# Patient Record
Sex: Female | Born: 1985 | Race: White | Hispanic: No | Smoking: Never smoker
Health system: Southern US, Community
[De-identification: ages and names within clinical notes are randomized; demographics above are authoritative.]

## PROBLEM LIST (undated history)

## (undated) ENCOUNTER — Inpatient Hospital Stay (HOSPITAL_COMMUNITY): Payer: Self-pay

## (undated) DIAGNOSIS — M519 Unspecified thoracic, thoracolumbar and lumbosacral intervertebral disc disorder: Secondary | ICD-10-CM

## (undated) DIAGNOSIS — I469 Cardiac arrest, cause unspecified: Secondary | ICD-10-CM

## (undated) DIAGNOSIS — D649 Anemia, unspecified: Secondary | ICD-10-CM

## (undated) DIAGNOSIS — M5134 Other intervertebral disc degeneration, thoracic region: Secondary | ICD-10-CM

## (undated) DIAGNOSIS — C539 Malignant neoplasm of cervix uteri, unspecified: Secondary | ICD-10-CM

## (undated) DIAGNOSIS — Z5189 Encounter for other specified aftercare: Secondary | ICD-10-CM

## (undated) DIAGNOSIS — I10 Essential (primary) hypertension: Secondary | ICD-10-CM

## (undated) DIAGNOSIS — O139 Gestational [pregnancy-induced] hypertension without significant proteinuria, unspecified trimester: Secondary | ICD-10-CM

## (undated) HISTORY — DX: Cardiac arrest, cause unspecified: I46.9

## (undated) HISTORY — DX: Encounter for other specified aftercare: Z51.89

## (undated) HISTORY — PX: CERVICAL CONE BIOPSY: SUR198

## (undated) HISTORY — PX: APPENDECTOMY: SHX54

## (undated) HISTORY — PX: LEEP: SHX91

## (undated) HISTORY — PX: OTHER SURGICAL HISTORY: SHX169

## (undated) HISTORY — PX: CHOLECYSTECTOMY: SHX55

## (undated) HISTORY — DX: Anemia, unspecified: D64.9

## (undated) HISTORY — DX: Other intervertebral disc degeneration, thoracic region: M51.34

---

## 2013-06-26 ENCOUNTER — Encounter (HOSPITAL_COMMUNITY): Payer: Self-pay

## 2013-06-26 ENCOUNTER — Inpatient Hospital Stay (HOSPITAL_COMMUNITY)
Admission: EM | Admit: 2013-06-26 | Discharge: 2013-06-28 | DRG: 880 | Disposition: A | Discharge status: Home / Self Care | Payer: MEDICAID | Source: Other Acute Inpatient Hospital | Attending: Internal Medicine | Admitting: Internal Medicine

## 2013-06-26 DIAGNOSIS — F411 Generalized anxiety disorder: Secondary | ICD-10-CM | POA: Diagnosis present

## 2013-06-26 DIAGNOSIS — I498 Other specified cardiac arrhythmias: Secondary | ICD-10-CM | POA: Diagnosis present

## 2013-06-26 DIAGNOSIS — Z8674 Personal history of sudden cardiac arrest: Secondary | ICD-10-CM

## 2013-06-26 DIAGNOSIS — R000 Tachycardia, unspecified: Secondary | ICD-10-CM

## 2013-06-26 DIAGNOSIS — E876 Hypokalemia: Secondary | ICD-10-CM | POA: Diagnosis present

## 2013-06-26 DIAGNOSIS — I471 Supraventricular tachycardia, unspecified: Secondary | ICD-10-CM | POA: Diagnosis present

## 2013-06-26 DIAGNOSIS — R5383 Other fatigue: Secondary | ICD-10-CM

## 2013-06-26 DIAGNOSIS — Z87410 Personal history of cervical dysplasia: Secondary | ICD-10-CM

## 2013-06-26 DIAGNOSIS — R569 Unspecified convulsions: Secondary | ICD-10-CM | POA: Diagnosis not present

## 2013-06-26 DIAGNOSIS — H5704 Mydriasis: Secondary | ICD-10-CM | POA: Diagnosis present

## 2013-06-26 DIAGNOSIS — R4182 Altered mental status, unspecified: Secondary | ICD-10-CM | POA: Diagnosis present

## 2013-06-26 DIAGNOSIS — R5381 Other malaise: Secondary | ICD-10-CM

## 2013-06-26 DIAGNOSIS — R011 Cardiac murmur, unspecified: Secondary | ICD-10-CM | POA: Diagnosis present

## 2013-06-26 DIAGNOSIS — R531 Weakness: Secondary | ICD-10-CM

## 2013-06-26 DIAGNOSIS — F449 Dissociative and conversion disorder, unspecified: Principal | ICD-10-CM

## 2013-06-26 LAB — RAPID URINE DRUG SCREEN, HOSP PERFORMED
Amphetamines: NOT DETECTED
Benzodiazepines: POSITIVE — AB
Cocaine: NOT DETECTED
Opiates: NOT DETECTED
Tetrahydrocannabinol: NOT DETECTED

## 2013-06-26 LAB — URINALYSIS, ROUTINE W REFLEX MICROSCOPIC
Bilirubin Urine: NEGATIVE
Glucose, UA: NEGATIVE mg/dL
Ketones, ur: NEGATIVE mg/dL
Leukocytes, UA: NEGATIVE
Protein, ur: NEGATIVE mg/dL
pH: 7 (ref 5.0–8.0)

## 2013-06-26 LAB — TSH: TSH: 0.779 u[IU]/mL (ref 0.350–4.500)

## 2013-06-26 LAB — CBC
Hemoglobin: 14.5 g/dL (ref 12.0–15.0)
MCH: 31 pg (ref 26.0–34.0)
MCHC: 35.5 g/dL (ref 30.0–36.0)
MCV: 87.4 fL (ref 78.0–100.0)
RBC: 4.68 MIL/uL (ref 3.87–5.11)

## 2013-06-26 MED ORDER — ACETAMINOPHEN 325 MG PO TABS
650.0000 mg | ORAL_TABLET | Freq: Four times a day (QID) | ORAL | Status: DC | PRN
  Administered 2013-06-26 – 2013-06-27 (×2): 650 mg via ORAL
  Filled 2013-06-26 (×2): qty 2

## 2013-06-26 MED ORDER — ACETAMINOPHEN 650 MG RE SUPP: 650.0000 mg | Freq: Four times a day (QID) | RECTAL | Status: DC | PRN

## 2013-06-26 MED ORDER — ONDANSETRON HCL 4 MG/2ML IJ SOLN
4.0000 mg | Freq: Four times a day (QID) | INTRAMUSCULAR | Status: DC | PRN
  Administered 2013-06-26 – 2013-06-27 (×2): 4 mg via INTRAVENOUS
  Filled 2013-06-26 (×2): qty 2

## 2013-06-26 MED ORDER — SODIUM CHLORIDE 0.9 % IJ SOLN
3.0000 mL | Freq: Two times a day (BID) | INTRAMUSCULAR | Status: DC
  Administered 2013-06-27: 3 mL via INTRAVENOUS

## 2013-06-26 MED ORDER — SODIUM CHLORIDE 0.9 % IV SOLN: INTRAVENOUS | Status: DC

## 2013-06-26 MED ORDER — HEPARIN SODIUM (PORCINE) 5000 UNIT/ML IJ SOLN
5000.0000 [IU] | Freq: Three times a day (TID) | INTRAMUSCULAR | Status: DC
  Administered 2013-06-26 – 2013-06-28 (×4): 5000 [IU] via SUBCUTANEOUS
  Filled 2013-06-26 (×8): qty 1
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
## 2013-06-26 NOTE — Progress Notes (Signed)
In room obtaining EKG and pt was verbal, engaging in normal conversation, pt became non-verbal and essentially unresponsive, with HR - Sinus Tach at 110, 99% on room air, respirations 17, BP: 158/80 - Lennette Bihari and Dr. Sung Amabile made aware.

## 2013-06-26 NOTE — Progress Notes (Signed)
While assessing patient, patient stated, "The next time they intubate me make sure they don't stick it down too far, I have been intubated three times and know when it is down to far." Pt sleepy but easily aroused by verbal stimulation. Vitals are stable.
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
## 2013-06-26 NOTE — Consult Note (Signed)
Reason for Consult: tachycardia  Requesting Physician: CCM  HPI: This is a 27 y.o. female with a past medical history significant for a history of reported LOC episodes since Feb 2013 requiring intubation on two prior occasions. Apparently no obvious cause has been found. She had a work up at [facility] in Nov 2012 (?) and some records from [place] indicate the final diagnosis was conversion disorder. There was a report she had an aborted attempt at a RFA because of HR 250. Yesterday she presented to the ER at [facility] with a complaint of weakness and fatigue. While in CT she became unresponsive and was intubated. She was later extubated and transferred to [facility] for further evaluation. Drug screen was negative. When I tried to interview her she was very lethargic but did wake up and respond. Her answers though were suspect. While I was speaking with her her HR went from 100 to 150- sinus tach.   PMHx:  No past medical history on file. No past surgical history on file.  FAMHx: No family history on file.  SOCHx:  has no tobacco, alcohol, and drug history on file.  ALLERGIES: Allergies  Allergen Reactions  . Codeine   . Compazine (Prochlorperazine Edisylate)   . Reglan (Metoclopramide)     ROS: Pertinent items are noted in HPI.  HOME MEDICATIONS: No prescriptions prior to admission    HOSPITAL MEDICATIONS: I have reviewed the patient's current medications.  VITALS: Blood pressure 122/68, pulse 75, temperature 98.7 F (37.1 C), temperature source Oral, resp. rate 19, height 5\' 6"  (1.676 m), weight 85.3 kg (188 lb 0.8 oz), SpO2 99.00%.  PHYSICAL EXAM: General appearance: appears stated age and slowed mentation Neck: no carotid bruit, no JVD and thyroid not enlarged, symmetric, no tenderness/mass/nodules Lungs: clear to auscultation bilaterally Heart: regular rate and rhythm, S1, S2 normal, no murmur, click, rub or gallop Abdomen: soft, non-tender; bowel sounds normal; no masses,  no  organomegaly Extremities: extremities normal, atraumatic, no cyanosis or edema Pulses: 2+ and symmetric Skin: Skin color, texture, turgor normal. No rashes or lesions Neurologic: Grossly normal she was lethargic but did wake up and answer questions.  LABS: Results for orders placed during the hospital encounter of 06/26/13 (from the past 48 hour(s))  URINALYSIS, ROUTINE W REFLEX MICROSCOPIC     Status: Abnormal   Collection Time    06/26/13  4:28 PM      Result Value Range   Color, Urine GREEN (*) YELLOW   Comment: BIOCHEMICALS MAY BE AFFECTED BY COLOR   APPearance CLEAR  CLEAR   Specific Gravity, Urine 1.010  1.005 - 1.030   pH 7.0  5.0 - 8.0   Glucose, UA NEGATIVE  NEGATIVE mg/dL   Hgb urine dipstick NEGATIVE  NEGATIVE   Bilirubin Urine NEGATIVE  NEGATIVE   Ketones, ur NEGATIVE  NEGATIVE mg/dL   Protein, ur NEGATIVE  NEGATIVE mg/dL   Urobilinogen, UA 0.2  0.0 - 1.0 mg/dL   Nitrite NEGATIVE  NEGATIVE   Leukocytes, UA NEGATIVE  NEGATIVE   Comment: MICROSCOPIC NOT DONE ON URINES WITH NEGATIVE PROTEIN, BLOOD, LEUKOCYTES, NITRITE, OR GLUCOSE <1000 mg/dL.    EKG: pend  IMAGING: No results found.  IMPRESSION: Active Problems:   Tachycardia- history of PSVT with ? attempted RFA at [facility] Nov 2013   Altered mental status- syncope while in CT- intubated then extubated    Weakness generalized- presented to the ER at [facility] with this complaint 06/26/13   Conversion reaction- reported final diagnosis from [facility] work  up   RECOMMENDATION: Drug screen pending. Check EKG. Will attempt to get records from [facility].   Time Spent Directly with Patient: 40 minutes  Abelino Derrick [phone] beeper 06/26/2013, 5:22 PM

## 2013-06-27 ENCOUNTER — Inpatient Hospital Stay (HOSPITAL_COMMUNITY): Payer: Self-pay

## 2013-06-27 ENCOUNTER — Inpatient Hospital Stay (HOSPITAL_COMMUNITY): Payer: MEDICAID

## 2013-06-27 LAB — BASIC METABOLIC PANEL
BUN: 13 mg/dL (ref 6–23)
Chloride: 101 mEq/L (ref 96–112)
Creatinine, Ser: 0.81 mg/dL (ref 0.50–1.10)
GFR calc Af Amer: >90 mL/min (ref >90)

## 2013-06-27 LAB — HEPATIC FUNCTION PANEL
Albumin: 4.1 g/dL (ref 3.5–5.2)
Bilirubin, Direct: 0.1 mg/dL (ref 0.0–0.3)
Total Bilirubin: 1 mg/dL (ref 0.3–1.2)

## 2013-06-27 LAB — MAGNESIUM: Magnesium: 2.3 mg/dL (ref 1.5–2.5)

## 2013-06-27 LAB — LIPASE, BLOOD: Lipase: 31 U/L (ref 11–59)

## 2013-06-27 LAB — CBC
HCT: 39.5 % (ref 36.0–46.0)
MCHC: 34.9 g/dL (ref 30.0–36.0)
MCV: 88 fL (ref 78.0–100.0)
RDW: 12.4 % (ref 11.5–15.5)

## 2013-06-27 MED ORDER — LORAZEPAM 2 MG/ML IJ SOLN
0.5000 mg | Freq: Once | INTRAMUSCULAR | Status: AC
  Administered 2013-06-27: 0.5 mg via INTRAVENOUS

## 2013-06-27 MED ORDER — PROMETHAZINE HCL 25 MG/ML IJ SOLN
12.5000 mg | Freq: Four times a day (QID) | INTRAMUSCULAR | Status: DC | PRN
  Administered 2013-06-27: 12.5 mg via INTRAVENOUS
  Filled 2013-06-27 (×2): qty 1

## 2013-06-27 MED ORDER — LORAZEPAM 2 MG/ML IJ SOLN
INTRAMUSCULAR | Status: AC
  Filled 2013-06-27: qty 1

## 2013-06-27 MED ORDER — POTASSIUM CHLORIDE CRYS ER 20 MEQ PO TBCR
40.0000 meq | EXTENDED_RELEASE_TABLET | Freq: Once | ORAL | Status: AC
  Administered 2013-06-27: 40 meq via ORAL
  Filled 2013-06-27: qty 2

## 2013-06-27 MED ORDER — TRAMADOL HCL 50 MG PO TABS
100.0000 mg | ORAL_TABLET | Freq: Four times a day (QID) | ORAL | Status: DC | PRN
  Administered 2013-06-27 – 2013-06-28 (×2): 100 mg via ORAL
  Filled 2013-06-27 (×3): qty 2

## 2013-06-27 MED ORDER — METOPROLOL TARTRATE 1 MG/ML IV SOLN
5.0000 mg | Freq: Once | INTRAVENOUS | Status: AC
  Administered 2013-06-27: 5 mg via INTRAVENOUS

## 2013-06-27 MED ORDER — ATENOLOL 25 MG PO TABS
25.0000 mg | ORAL_TABLET | Freq: Every day | ORAL | Status: DC
  Filled 2013-06-27 (×2): qty 1

## 2013-06-27 MED ORDER — PROMETHAZINE HCL 25 MG/ML IJ SOLN
12.5000 mg | Freq: Four times a day (QID) | INTRAMUSCULAR | Status: DC | PRN
  Administered 2013-06-27 – 2013-06-28 (×2): 12.5 mg via INTRAVENOUS
  Filled 2013-06-27 (×2): qty 1

## 2013-06-27 MED ORDER — METOPROLOL TARTRATE 1 MG/ML IV SOLN
INTRAVENOUS | Status: AC
  Filled 2013-06-27: qty 5

## 2013-06-27 NOTE — Progress Notes (Addendum)
eLink Physician-Brief Progress Note Patient Name: Regina Foley DOB: 03-14-1986 MRN: [id number]  Date of Service  06/27/2013   HPI/Events of Note   C/o leg cramps  eICU Interventions  Check bmet, phos, mag  Labs back 5:44 AM  Recent Labs Lab 06/26/13 1740 06/27/13 0425  NA  --  137  K  --  3.4*  CL  --  101  CO2  --  24  GLUCOSE  --  93  BUN  --  13  CREATININE 0.81 0.81  CALCIUM  --  9.6  MG  --  2.3  PHOS  --  3.4   PLAN  - replete KCL and watch for cramps      Regina Foley 06/27/2013, 3:57 AM

## 2013-06-27 NOTE — Consult Note (Signed)
Reason for Consult: Pseudo-seizures, conversion disorder Referring Physician: Dr. Armando Reichert Regina Foley is an 27 y.o. female.  HPI: Patient complains of nausea and is visibly upset about waiting for her phenergan.  She came to the ED with chest pains.  Rule out Pott's syndrome, factitious disorder noted from unconscious presentation of symptoms caused by underlying stress if all physical issues ruled out.  She denies depression, suicidal/homicidal ideations, and auditory/visual hallucinations.  Dr. Lucianne Muss examined the patient and collaborated with treatment plan below.  History reviewed. No pertinent past medical history.  History reviewed. No pertinent past surgical history.  History reviewed. No pertinent family history.  Social History:  reports that she has never smoked. She does not have any smokeless tobacco history on file. Her alcohol and drug histories are not on file.  Allergies:  Allergies  Allergen Reactions  . Codeine     unknown  . Compazine (Prochlorperazine Edisylate)     unknown  . Reglan (Metoclopramide)     unknown    Medications: I have reviewed the patient's current medications.  Results for orders placed during the hospital encounter of 06/26/13 (from the past 48 hour(s))  URINALYSIS, ROUTINE W REFLEX MICROSCOPIC     Status: Abnormal   Collection Time    06/26/13  4:28 PM      Result Value Range   Color, Urine GREEN (*) YELLOW   Comment: BIOCHEMICALS MAY BE AFFECTED BY COLOR   APPearance CLEAR  CLEAR   Specific Gravity, Urine 1.010  1.005 - 1.030   pH 7.0  5.0 - 8.0   Glucose, UA NEGATIVE  NEGATIVE mg/dL   Hgb urine dipstick NEGATIVE  NEGATIVE   Bilirubin Urine NEGATIVE  NEGATIVE   Ketones, ur NEGATIVE  NEGATIVE mg/dL   Protein, ur NEGATIVE  NEGATIVE mg/dL   Urobilinogen, UA 0.2  0.0 - 1.0 mg/dL   Nitrite NEGATIVE  NEGATIVE   Leukocytes, UA NEGATIVE  NEGATIVE   Comment: MICROSCOPIC NOT DONE ON URINES WITH NEGATIVE PROTEIN, BLOOD, LEUKOCYTES, NITRITE, OR  GLUCOSE <1000 mg/dL.  URINE RAPID DRUG SCREEN (HOSP PERFORMED)     Status: Abnormal   Collection Time    06/26/13  4:28 PM      Result Value Range   Opiates NONE DETECTED  NONE DETECTED   Cocaine NONE DETECTED  NONE DETECTED   Benzodiazepines POSITIVE (*) NONE DETECTED   Amphetamines NONE DETECTED  NONE DETECTED   Tetrahydrocannabinol NONE DETECTED  NONE DETECTED   Barbiturates NONE DETECTED  NONE DETECTED   Comment:            DRUG SCREEN FOR MEDICAL PURPOSES     ONLY.  IF CONFIRMATION IS NEEDED     FOR ANY PURPOSE, NOTIFY LAB     WITHIN 5 DAYS.                LOWEST DETECTABLE LIMITS     FOR URINE DRUG SCREEN     Drug Class       Cutoff (ng/mL)     Amphetamine      1000     Barbiturate      200     Benzodiazepine   200     Tricyclics       300     Opiates          300     Cocaine          300     THC  50  MRSA PCR SCREENING     Status: None   Collection Time    06/26/13  4:30 PM      Result Value Range   MRSA by PCR NEGATIVE  NEGATIVE   Comment:            The GeneXpert MRSA Assay (FDA     approved for NASAL specimens     only), is one component of a     comprehensive MRSA colonization     surveillance program. It is not     intended to diagnose MRSA     infection nor to guide or     monitor treatment for     MRSA infections.  CBC     Status: None   Collection Time    06/26/13  5:40 PM      Result Value Range   WBC 8.7  4.0 - 10.5 K/uL   RBC 4.68  3.87 - 5.11 MIL/uL   Hemoglobin 14.5  12.0 - 15.0 g/dL   HCT 98.1  19.1 - 47.8 %   MCV 87.4  78.0 - 100.0 fL   MCH 31.0  26.0 - 34.0 pg   MCHC 35.5  30.0 - 36.0 g/dL   RDW 29.5  62.1 - 30.8 %   Platelets 226  150 - 400 K/uL  CREATININE, SERUM     Status: None   Collection Time    06/26/13  5:40 PM      Result Value Range   Creatinine, Ser 0.81  0.50 - 1.10 mg/dL   GFR calc non Af Amer >90  >90 mL/min   GFR calc Af Amer >90  >90 mL/min   Comment:            The eGFR has been calculated     using  the CKD EPI equation.     This calculation has not been     validated in all clinical     situations.     eGFR's persistently     <90 mL/min signify     possible Chronic Kidney Disease.  TSH     Status: None   Collection Time    06/26/13  5:40 PM      Result Value Range   TSH 0.779  0.350 - 4.500 uIU/mL  ETHANOL     Status: None   Collection Time    06/26/13  6:00 PM      Result Value Range   Alcohol, Ethyl (B) <11  0 - 11 mg/dL   Comment:            LOWEST DETECTABLE LIMIT FOR     SERUM ALCOHOL IS 11 mg/dL     FOR MEDICAL PURPOSES ONLY  MAGNESIUM     Status: None   Collection Time    06/27/13  4:25 AM      Result Value Range   Magnesium 2.3  1.5 - 2.5 mg/dL  PHOSPHORUS     Status: None   Collection Time    06/27/13  4:25 AM      Result Value Range   Phosphorus 3.4  2.3 - 4.6 mg/dL  BASIC METABOLIC PANEL     Status: Abnormal   Collection Time    06/27/13  4:25 AM      Result Value Range   Sodium 137  135 - 145 mEq/L   Potassium 3.4 (*) 3.5 - 5.1 mEq/L   Chloride 101  96 - 112 mEq/L   CO2  24  19 - 32 mEq/L   Glucose, Bld 93  70 - 99 mg/dL   BUN 13  6 - 23 mg/dL   Creatinine, Ser 1.61  0.50 - 1.10 mg/dL   Calcium 9.6  8.4 - 09.6 mg/dL   GFR calc non Af Amer >90  >90 mL/min   GFR calc Af Amer >90  >90 mL/min   Comment:            The eGFR has been calculated     using the CKD EPI equation.     This calculation has not been     validated in all clinical     situations.     eGFR's persistently     <90 mL/min signify     possible Chronic Kidney Disease.  CBC     Status: None   Collection Time    06/27/13  4:25 AM      Result Value Range   WBC 6.3  4.0 - 10.5 K/uL   RBC 4.49  3.87 - 5.11 MIL/uL   Hemoglobin 13.8  12.0 - 15.0 g/dL   HCT 04.5  40.9 - 81.1 %   MCV 88.0  78.0 - 100.0 fL   MCH 30.7  26.0 - 34.0 pg   MCHC 34.9  30.0 - 36.0 g/dL   RDW 91.4  78.2 - 95.6 %   Platelets 176  150 - 400 K/uL    Dg Chest Port 1 View  06/27/2013   *RADIOLOGY REPORT*   Clinical Data: Respiratory failure.  PORTABLE CHEST - 1 VIEW  Comparison: 06/26/2013  Findings: The patient has been extubated.  Lungs are completely clear bilaterally.  No pleural fluid is seen.  No pneumothorax. Heart size is normal.  IMPRESSION: Clear lungs without active disease.   Original Report Authenticated By: Irish Lack, M.D.    Review of Systems  Constitutional: Negative.   HENT: Negative.   Eyes: Negative.   Respiratory: Negative.   Cardiovascular: Negative.   Gastrointestinal: Positive for nausea.  Genitourinary: Negative.   Musculoskeletal: Negative.   Skin: Negative.   Neurological: Negative.   Endo/Heme/Allergies: Negative.   Psychiatric/Behavioral: The patient is nervous/anxious.    Blood pressure 121/75, pulse 74, temperature 98.7 F (37.1 C), temperature source Oral, resp. rate 18, height 5\' 6"  (1.676 m), weight 85.3 kg (188 lb 0.8 oz), SpO2 98.00%. Physical Exam  Completed by primary MD, reviewed  Family History:  No family history on file.  Assessment/Plan:   Mental Status Examination/Evaluation: Patient is neatly groomed and makes good eye contact.  Irritable, anxious mood with congruent affect.  She is cooperative, denies any active or passive suicidal thoughts or homicidal thoughts.  Her thoughts are organized and she answers questions appropriately.  There is no paranoia or delusions present at this time.  She denies any auditory or visual hallucination.  Her attention and concentration are good.  Her insight is poor and judgment is fair.  DIAGNOSIS:  AXIS I  General Anxiety Disorder  AXIS II  Cluster B Traits  AXIS III  None  AXIS IV  other psychosocial or environmental problems, financial issues, problems with primary support group   AXIS V  61-70 mild symptoms    Assessment/Plan: Patient does not need inpatient psychiatric treatment. Rule out Pott's syndrome,  Recommend followup treatment at local mental health facility for therapy for her multiple  stressors. Contact Child psychotherapist for outpatient discharge planning.  Regina Foley, PMH-NP 06/27/2013, 11:22 AM

## 2013-06-27 NOTE — Progress Notes (Signed)
0355: Pt c/o leg cramps. Encourage movement/stretching of legs by pt and offered warm compress. Pt refused both stating "Moving them makes them worse" and "heat never works for me". Offered pt Tylenol, pt refuse.  Call MD, notified, lab orders received and to make them stat and to call back results.  0400: Pt updated. Lab called and notified of orders.

## 2013-06-27 NOTE — Progress Notes (Signed)
Pt continues c/o leg cramps and generalized pain. Pt states "I feel like I have been hit by a truck". Offered pt warm compress as well as tylenol. Pt refuses warm compress and c/o of nausea. Tylenol and Zofran given, see emar.   Lab results back, reported to [facility] nurse. Report pt's continue complaints and this nurse interventions. Informed that MD will be made aware.

## 2013-06-27 NOTE — Progress Notes (Signed)
Called and notified patient is tachycardic at rest at 130, narrow complex, appears to be sinus tachycardia. Similar presentation in the past. Apparently given IV lopressor by Dr. Susie Cassette. Rapid response called, but patient is hemodynamically stable. If she is willing to take low dose atenolol 25 mg daily or toprol XL 12.5 mg daily, that can be considered. Would recommend follow-up with Dr. Tristan Schroeder at [facility] EP.   Chrystie Nose, MD, [facility] Attending Cardiologist [facility]

## 2013-06-27 NOTE — Progress Notes (Signed)
Utilization Review Completed.  

## 2013-06-27 NOTE — Progress Notes (Signed)
Pt continues to complain of generalized pain and throat pain unrelieved by tylenol. MD called, notified, informed rounding MD will round on pt shortly. Will notified pt.
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
## 2013-06-27 NOTE — Progress Notes (Addendum)
Events noted overnight. She feels tired and sore all over today. She does not remember speaking with me yesterday or how she arrived in [place]. She is still having leg cramps and she is nauseated today and requested phenergan (however, she has allergy to compazine and reglan). Telemetry was unrevealing overnight - vitals are stable. She wants to go home today. She has cardiology care at [facility] and prefers to follow with them. She says she was released by [facility] psychiatry and does not have follow-up.  Will defer to medicine service regarding discharge.   Will sign-off.  Chrystie Nose, MD, [facility] Attending Cardiologist [facility]

## 2013-06-27 NOTE — Progress Notes (Signed)
Pt called RN to room at 1640 c/o of feeling like "her heart is pounding out of her chest."  Pt shaking on assessment. Pt also c/o "tingling in her hands and her mouth and shortness of breath."  Pt BP 151/103.  Pt HR 115-150 bpm.  Pt O2 sat 80% on RA. Pt placed on 2L nasal cannula, O2 sats increased to 92-100% on 2 L.  Rapid response RN, Debbie, called to assess; Dr. Susie Cassette notified. Orders received to administer 0.5 mg IV ativan and 5mg  IV metoprolol.  Medication administered. Cardiology notified, orders received to obtain EKG. EKG shows normal sinus rhythm. MD aware.  Following administration of IV ativan pt HR dropped to 105.  Pt then given IV metoprolol, pt BP 154/101, HR 99, O2 100% on 2L, RR24.  Pt still complaining of tingling in hands . MD aware. Will continue to monitor. Efraim Kaufmann

## 2013-06-27 NOTE — Progress Notes (Signed)
Attempted x 2 to call report. Nurse will call back.

## 2013-06-28 MED ORDER — PROMETHAZINE HCL 12.5 MG PO TABS: 12.5000 mg | ORAL_TABLET | Freq: Four times a day (QID) | ORAL | Status: DC | PRN

## 2013-06-28 MED ORDER — ATENOLOL 25 MG PO TABS: 25.0000 mg | ORAL_TABLET | Freq: Every day | ORAL | Status: DC

## 2013-06-28 NOTE — Progress Notes (Signed)
Pt stated that she had not voided since wed there is an UA that had resulted on 7/17 1628 Pt was bladder scanned for and [facility] was notified whatwas stated and that results of bladder scan I & O cath was ordered when attempted to I&O no urine resulted. I then asked another RN to attempt to I&O but to scan pt first to see if any urine was in bladder the results were no further attempts were made I asked if pt went to the bathroom while I was getting the kit she said no and requested her phenergan will continue to monitor. Ilean Skill LPN

## 2013-06-28 NOTE — Discharge Summary (Signed)
Physician Discharge Summary  Regina Foley WJX:[id number] DOB: 01-16-1986 DOA: 06/26/2013  PCP: No PCP Per Patient  Admit date: 06/26/2013 Discharge date: 06/28/2013  Time spent: 45 minutes  Recommendations for Outpatient Follow-up:  1. Follow up with PCP in 3-5 days 2. Follow up with Dr. Tristan Schroeder at [facility] cardiology   Recommendations for primary care physician for things to follow:  Recheck BMP.  Discharge Diagnoses:  Active Problems:   Tachycardia- history of PSVT with ? attempted RFA at [facility] Nov 2013   Altered mental status- syncope while in CT- intubated then extubated    Weakness generalized- presented to the ER at [facility] with this complaint 06/26/13   Conversion reaction- reported final diagnosis from [facility] work up  Discharge Condition: stable  Diet recommendation: heart healthy  Filed Weights   06/26/13 1500 06/27/13 1450  Weight: 85.3 kg (188 lb 0.8 oz) 85.73 kg (189 lb)    History of present illness:  27 y/o F, non-smoker, non-drinker with complicated PMH to include multiple episodes of recurrent tachyarrhythmias, "cardiac arrests", 3 intubations and cervical dysplasia admitted from [facility] after episode of malaise and weakness. Lab evaluation was essentially normal except mild hypokalemia. Negative D-Dimer, negative drug screen In CT, patient had episode of altered mental status and was intubated. Patient was able to be extubated & transferred to [facility] for further care. She has no recollection of events at hospital or prior to "passing out" episodes. She has had extensive work up in the past with EP / Cardiology at [facility] but is unclear of all the results. She reports she had an episode of "cardiac arrest" during an ablation procedure. Notes reveal that she had tachyarrhythmia with rate in 250's and procedure had to be aborted. She denies blurry vision, headaches, changes in strength, n/v, loss of bowel / bladder function, chest pain with inspiration, chest pain.  Reports shortness of breath associated with episodes of SVT. Also, reports hx of Tricuspid Valve murmur. 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 She indicates she hasn't had much sleep. Has been seen at [facility] - other working dx have been POTS & Conversion Disorder per previous medical documentation.   Hospital Course:   Patient has a complex history with multiple hospitalizations for syncopal episodes/unresponsiveness without an apparent cause. She was initially admitted in the ICU overnight. Cardiology has been consulted and based on her few episodes of tachycardia while hospitalized recommended low dose Atenolol. During her stay, patient had several episodes of generalized pain, leg cramps, nausea, shortness of breath, arm and mouth "tingling" that self resolved. By morning on 7/19, patient had no chest pain, palpitations and other than nausea which usually accompanies her hospital stays she was asymptomatic. Given documented concern for conversion disorder, Psychiatry has also been consulted and recommended no acute need for inpatient psychiatry treatment and recommended to follow up at local mental health facility for therapy for her multiple stressors. She was discharged in stable condition and advised to follow up with her PCP in 2-3 days for follow up.   Procedures:  none   Consultations:  Cardiology  Psychiatry  PCCM  Discharge Exam: Filed Vitals:   06/27/13 1450 06/27/13 1643 06/27/13 2100 06/28/13 0530  BP: 122/74 151/103 137/88 137/94  Pulse: 75 137 102 84  Temp: 99 F (37.2 C) 97.5 F (36.4 C) 98.5 F (36.9 C) 97.9 F (36.6 C)  TempSrc:   Oral Oral  Resp:  20 22 18 20   Height: 5\' 6"  (1.676 m)     Weight: 85.73 kg (189 lb)     SpO2: 98% 100% 100% 99%   General: NAD Cardiovascular: RRR Respiratory: CTA biL  Discharge Instructions     Medication List         atenolol  25 MG tablet  Commonly known as:  TENORMIN  Take 1 tablet (25 mg total) by mouth daily.     promethazine 12.5 MG tablet  Commonly known as:  PHENERGAN  Take 1 tablet (12.5 mg total) by mouth every 6 (six) hours as needed for nausea.       The results of significant diagnostics from this hospitalization (including imaging, microbiology, ancillary and laboratory) are listed below for reference.    Significant Diagnostic Studies: US Abdomen Complete  06/28/2013   *RADIOLOGY REPORT*  Clinical Data:  Nausea.  ABDOMINAL ULTRASOUND COMPLETE  Comparison:  CT of the abdomen from 06/26/2013  Findings:  Gallbladder:  Status post cholecystectomy; no retained stones seen at the gallbladder fossa.  Common Bile Duct:  0.6 cm in diameter; within normal limits in caliber.  Liver:  Diffusely increased hepatic echogenicity and coarsened echotexture, compatible with fatty infiltration; no focal lesions identified.  Limited Doppler evaluation demonstrates normal blood flow within the liver.  IVC:  Unremarkable in appearance.  Pancreas:  Although the pancreas is difficult to visualize in its entirety due to overlying bowel gas, no focal pancreatic abnormality is identified.  Spleen:  16.3 cm in length; diffusely enlarged, with a volume of 786 mL.  Right kidney:  12.4 cm in length; normal in size, configuration and parenchymal echogenicity.  No evidence of mass or hydronephrosis. The known right-sided renal stone is not well characterized on ultrasound.  Left kidney:  10.9 cm in length; normal in size and configuration. No evidence of mass or hydronephrosis.  There is slightly increased echogenicity at the renal pyramids, but this is thought to be artifactual in nature; no associated calcification was seen on recent CT.  Abdominal Aorta:  Normal in caliber; no aneurysm identified.  IMPRESSION:  1.  Status post cholecystectomy; no retained stones seen at the gallbladder fossa. 2.  Diffuse fatty infiltration within the liver.  3.  Splenomegaly noted.   Original Report Authenticated By: Tonia Ghent, M.D.   Dg Chest Port 1 View  06/27/2013   *RADIOLOGY REPORT*  Clinical Data: Respiratory failure.  PORTABLE CHEST - 1 VIEW  Comparison: 06/26/2013  Findings: The patient has been extubated.  Lungs are completely clear bilaterally.  No pleural fluid is seen.  No pneumothorax. Heart size is normal.  IMPRESSION: Clear lungs without active disease.   Original Report Authenticated By: Irish Lack, M.D.    Microbiology: Recent Results (from the past 240 hour(s))  MRSA PCR SCREENING     Status: None   Collection Time    06/26/13  4:30 PM      Result Value Range Status   MRSA by PCR NEGATIVE  NEGATIVE Final   Comment:            The GeneXpert MRSA Assay (FDA     approved for NASAL specimens     only), is one component of a     comprehensive MRSA colonization     surveillance program. It is not     intended to diagnose MRSA     infection nor to guide or     monitor treatment for     MRSA infections.  Labs: Basic Metabolic Panel:  Recent Labs Lab 06/26/13 1740 06/27/13 0425  NA  --  137  K  --  3.4*  CL  --  101  CO2  --  24  GLUCOSE  --  93  BUN  --  13  CREATININE 0.81 0.81  CALCIUM  --  9.6  MG  --  2.3  PHOS  --  3.4   Liver Function Tests:  Recent Labs Lab 06/27/13 1030  AST 23  ALT 15  ALKPHOS 86  BILITOT 1.0  PROT 7.2  ALBUMIN 4.1    Recent Labs Lab 06/27/13 1030  LIPASE 31   CBC:  Recent Labs Lab 06/26/13 1740 06/27/13 0425  WBC 8.7 6.3  HGB 14.5 13.8  HCT 40.9 39.5  MCV 87.4 88.0  PLT 226 176   Signed:  Queena Monrreal  [facility] 06/28/2013, 1:48 PM

## 2013-06-29 NOTE — Consult Note (Signed)
Patient seen, evaluated and plan formulated by me 

## 2014-04-02 IMAGING — CR DG CHEST 1V PORT
1 series · 1 of 1 positions shown · non-contrast
Comparison: 06/26/2013

CLINICAL DATA: Respiratory failure.

PORTABLE CHEST - 1 VIEW

[AP]
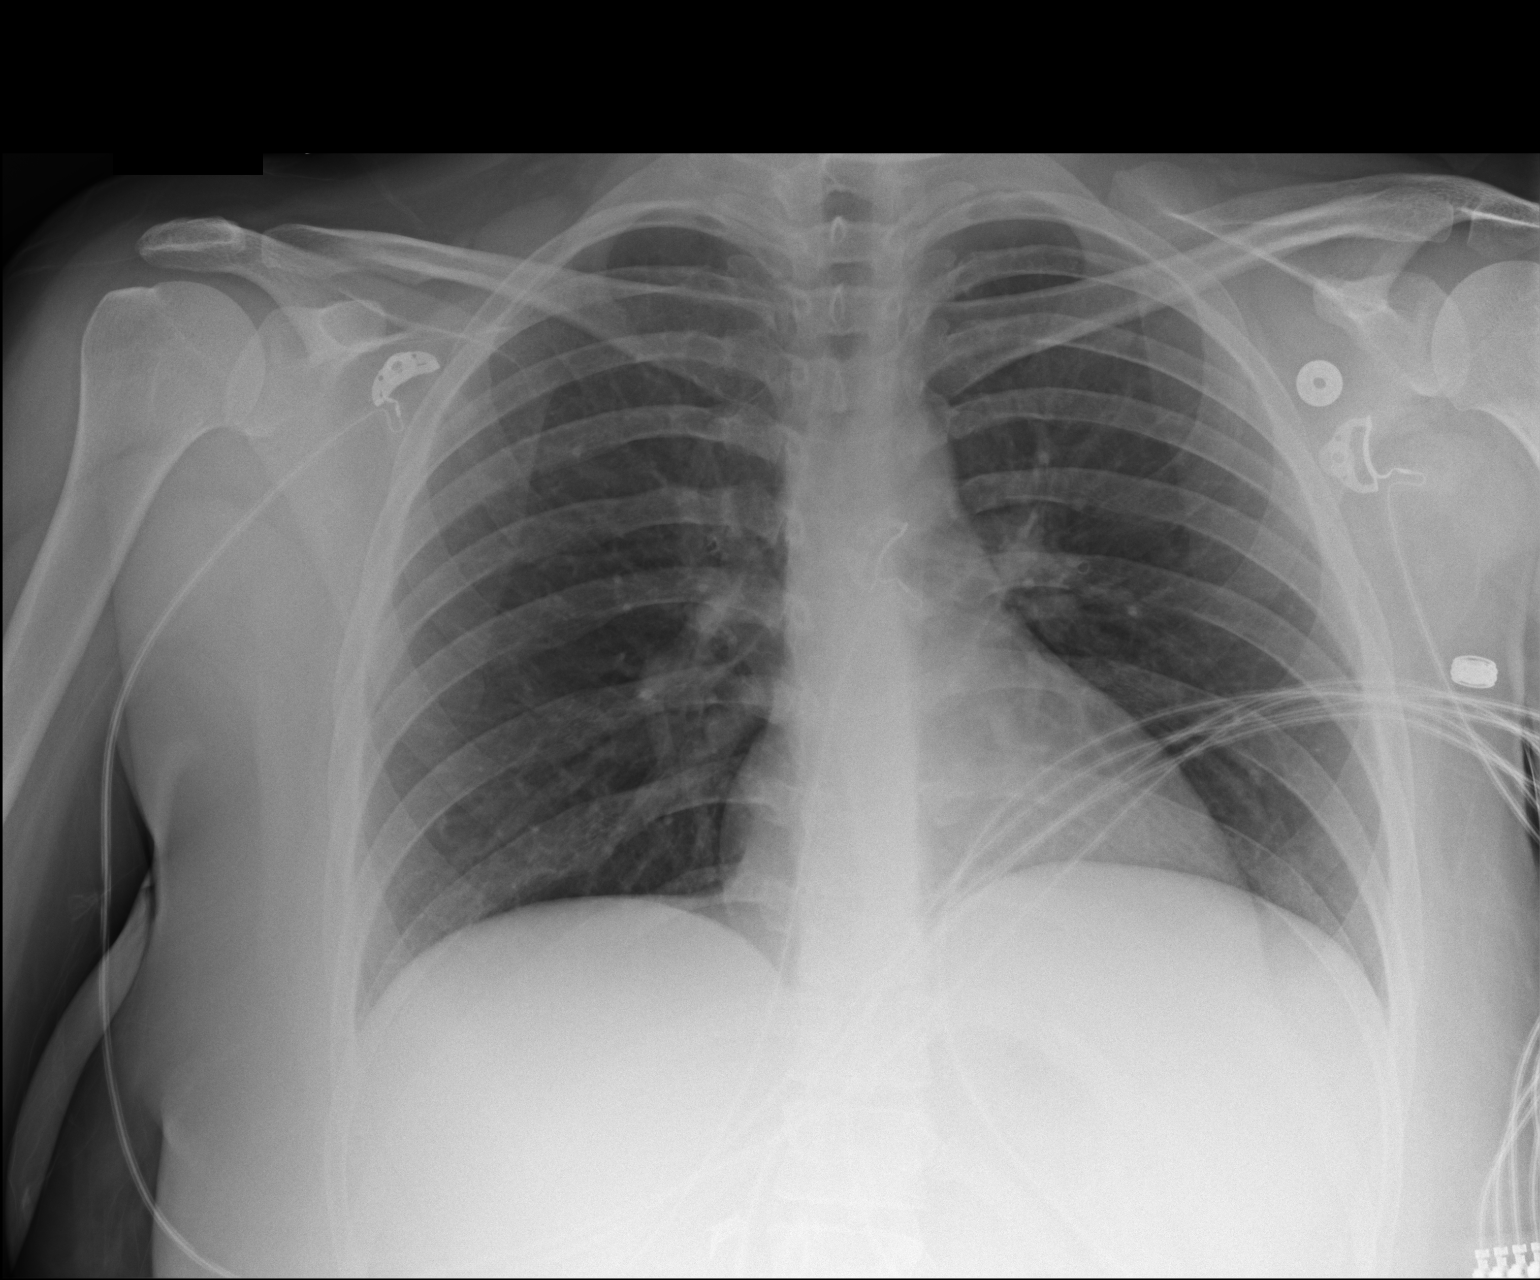

[1 of 1 positions shown; findings below may reference images not displayed]

FINDINGS: The patient has been extubated.  Lungs are completely
clear bilaterally.  No pleural fluid is seen.  No pneumothorax.
Heart size is normal.
IMPRESSION: Clear lungs without active disease.

## 2016-05-16 ENCOUNTER — Other Ambulatory Visit (HOSPITAL_COMMUNITY): Payer: Self-pay | Admitting: Obstetrics and Gynecology

## 2016-05-16 DIAGNOSIS — Z3141 Encounter for fertility testing: Secondary | ICD-10-CM

## 2016-05-25 ENCOUNTER — Other Ambulatory Visit (HOSPITAL_COMMUNITY): Payer: Self-pay

## 2016-12-08 ENCOUNTER — Other Ambulatory Visit (HOSPITAL_COMMUNITY): Payer: Self-pay | Admitting: Obstetrics and Gynecology

## 2016-12-08 DIAGNOSIS — Z3141 Encounter for fertility testing: Secondary | ICD-10-CM

## 2016-12-13 ENCOUNTER — Ambulatory Visit (HOSPITAL_COMMUNITY)
Admission: RE | Admit: 2016-12-13 | Discharge: 2016-12-13 | Disposition: A | Discharge status: Home / Self Care | Payer: Managed Care, Other (non HMO) | Source: Ambulatory Visit | Attending: Obstetrics and Gynecology | Admitting: Obstetrics and Gynecology

## 2016-12-13 ENCOUNTER — Encounter (INDEPENDENT_AMBULATORY_CARE_PROVIDER_SITE_OTHER): Payer: Self-pay

## 2016-12-13 DIAGNOSIS — Z3141 Encounter for fertility testing: Secondary | ICD-10-CM

## 2016-12-13 DIAGNOSIS — N979 Female infertility, unspecified: Secondary | ICD-10-CM | POA: Diagnosis present

## 2016-12-13 MED ORDER — IOPAMIDOL (ISOVUE-300) INJECTION 61%
30.0000 mL | Freq: Once | INTRAVENOUS | Status: AC | PRN
  Administered 2016-12-13: 5 mL

## 2016-12-29 ENCOUNTER — Ambulatory Visit: Payer: Self-pay | Admitting: Allergy

## 2017-08-31 LAB — OB RESULTS CONSOLE PLATELET COUNT: Platelets: 239

## 2017-08-31 LAB — OB RESULTS CONSOLE HIV ANTIBODY (ROUTINE TESTING): HIV: NONREACTIVE

## 2017-08-31 LAB — OB RESULTS CONSOLE ANTIBODY SCREEN: ANTIBODY SCREEN: NEGATIVE

## 2017-08-31 LAB — OB RESULTS CONSOLE HGB/HCT, BLOOD
HCT: 40
Hemoglobin: 13.9

## 2017-08-31 LAB — OB RESULTS CONSOLE RUBELLA ANTIBODY, IGM: Rubella: IMMUNE

## 2017-08-31 LAB — OB RESULTS CONSOLE HEPATITIS B SURFACE ANTIGEN: HEP B S AG: NEGATIVE

## 2017-08-31 LAB — OB RESULTS CONSOLE GBS: STREP GROUP B AG: POSITIVE

## 2017-08-31 LAB — OB RESULTS CONSOLE ABO/RH: RH TYPE: POSITIVE

## 2017-09-14 LAB — OB RESULTS CONSOLE GC/CHLAMYDIA
CHLAMYDIA, DNA PROBE: NEGATIVE
Gonorrhea: NEGATIVE

## 2017-10-29 ENCOUNTER — Inpatient Hospital Stay (HOSPITAL_COMMUNITY)
Admission: AD | Admit: 2017-10-29 | Discharge: 2017-10-29 | Disposition: A | Discharge status: Home / Self Care | Payer: Managed Care, Other (non HMO) | Source: Ambulatory Visit | Attending: Emergency Medicine | Admitting: Emergency Medicine

## 2017-10-29 ENCOUNTER — Encounter (HOSPITAL_COMMUNITY): Payer: Self-pay

## 2017-10-29 ENCOUNTER — Other Ambulatory Visit: Payer: Self-pay

## 2017-10-29 DIAGNOSIS — I471 Supraventricular tachycardia: Secondary | ICD-10-CM | POA: Diagnosis not present

## 2017-10-29 DIAGNOSIS — Z79899 Other long term (current) drug therapy: Secondary | ICD-10-CM | POA: Insufficient documentation

## 2017-10-29 DIAGNOSIS — R0602 Shortness of breath: Secondary | ICD-10-CM | POA: Insufficient documentation

## 2017-10-29 DIAGNOSIS — O10912 Unspecified pre-existing hypertension complicating pregnancy, second trimester: Secondary | ICD-10-CM | POA: Diagnosis not present

## 2017-10-29 DIAGNOSIS — R51 Headache: Secondary | ICD-10-CM | POA: Diagnosis present

## 2017-10-29 DIAGNOSIS — O10919 Unspecified pre-existing hypertension complicating pregnancy, unspecified trimester: Secondary | ICD-10-CM

## 2017-10-29 DIAGNOSIS — R569 Unspecified convulsions: Secondary | ICD-10-CM | POA: Insufficient documentation

## 2017-10-29 DIAGNOSIS — Z8541 Personal history of malignant neoplasm of cervix uteri: Secondary | ICD-10-CM | POA: Insufficient documentation

## 2017-10-29 DIAGNOSIS — Z3A16 16 weeks gestation of pregnancy: Secondary | ICD-10-CM | POA: Diagnosis not present

## 2017-10-29 DIAGNOSIS — Z8674 Personal history of sudden cardiac arrest: Secondary | ICD-10-CM | POA: Diagnosis not present

## 2017-10-29 DIAGNOSIS — F4323 Adjustment disorder with mixed anxiety and depressed mood: Secondary | ICD-10-CM | POA: Insufficient documentation

## 2017-10-29 DIAGNOSIS — O26892 Other specified pregnancy related conditions, second trimester: Secondary | ICD-10-CM

## 2017-10-29 DIAGNOSIS — R519 Headache, unspecified: Secondary | ICD-10-CM

## 2017-10-29 HISTORY — DX: Essential (primary) hypertension: I10

## 2017-10-29 HISTORY — DX: Malignant neoplasm of cervix uteri, unspecified: C53.9

## 2017-10-29 LAB — COMPREHENSIVE METABOLIC PANEL
ALBUMIN: 3.5 g/dL (ref 3.5–5.0)
ALK PHOS: 48 U/L (ref 38–126)
ALT: 13 U/L — AB (ref 14–54)
AST: 19 U/L (ref 15–41)
Anion gap: 8 (ref 5–15)
BUN: 8 mg/dL (ref 6–20)
CALCIUM: 9.6 mg/dL (ref 8.9–10.3)
CHLORIDE: 105 mmol/L (ref 101–111)
CO2: 24 mmol/L (ref 22–32)
CREATININE: 0.48 mg/dL (ref 0.44–1.00)
GFR calc Af Amer: >60 mL/min (ref >60)
GFR calc non Af Amer: >60 mL/min (ref >60)
GLUCOSE: 92 mg/dL (ref 65–99)
Potassium: 4 mmol/L (ref 3.5–5.1)
Sodium: 137 mmol/L (ref 135–145)
Total Bilirubin: 0.4 mg/dL (ref 0.3–1.2)
Total Protein: 6.8 g/dL (ref 6.5–8.1)

## 2017-10-29 LAB — CBC WITH DIFFERENTIAL/PLATELET
BASOS PCT: 0 %
Basophils Absolute: 0 10*3/uL (ref 0.0–0.1)
EOS ABS: 0.1 10*3/uL (ref 0.0–0.7)
Eosinophils Relative: 1 %
HEMATOCRIT: 36.1 % (ref 36.0–46.0)
Hemoglobin: 12.2 g/dL (ref 12.0–15.0)
LYMPHS ABS: 2.3 10*3/uL (ref 0.7–4.0)
Lymphocytes Relative: 20 %
MCH: 30.3 pg (ref 26.0–34.0)
MCHC: 33.8 g/dL (ref 30.0–36.0)
MCV: 89.8 fL (ref 78.0–100.0)
MONO ABS: 0.3 10*3/uL (ref 0.1–1.0)
MONOS PCT: 3 %
Neutro Abs: 8.4 10*3/uL — ABNORMAL HIGH (ref 1.7–7.7)
Neutrophils Relative %: 76 %
Platelets: 204 10*3/uL (ref 150–400)
RBC: 4.02 MIL/uL (ref 3.87–5.11)
RDW: 12.4 % (ref 11.5–15.5)
WBC: 11.1 10*3/uL — ABNORMAL HIGH (ref 4.0–10.5)

## 2017-10-29 LAB — URINALYSIS, ROUTINE W REFLEX MICROSCOPIC
Bilirubin Urine: NEGATIVE
Glucose, UA: NEGATIVE mg/dL
Hgb urine dipstick: NEGATIVE
Ketones, ur: NEGATIVE mg/dL
LEUKOCYTES UA: NEGATIVE
Nitrite: NEGATIVE
PROTEIN: NEGATIVE mg/dL
SPECIFIC GRAVITY, URINE: 1.019 (ref 1.005–1.030)
pH: 6 (ref 5.0–8.0)

## 2017-10-29 LAB — D-DIMER, QUANTITATIVE (NOT AT ARMC)

## 2017-10-29 LAB — PROTEIN / CREATININE RATIO, URINE
Creatinine, Urine: 134 mg/dL
PROTEIN CREATININE RATIO: 0.05 mg/mg{creat} (ref 0.00–0.15)
Total Protein, Urine: 7 mg/dL

## 2017-10-29 LAB — RAPID URINE DRUG SCREEN, HOSP PERFORMED
AMPHETAMINES: NOT DETECTED
BENZODIAZEPINES: NOT DETECTED
Barbiturates: NOT DETECTED
Cocaine: NOT DETECTED
OPIATES: NOT DETECTED
TETRAHYDROCANNABINOL: NOT DETECTED

## 2017-10-29 LAB — TROPONIN I: Troponin I: <0.03 ng/mL (ref <0.03)

## 2017-10-29 MED ORDER — SODIUM CHLORIDE 0.9 % IV SOLN
INTRAVENOUS | Status: DC
  Administered 2017-10-29: 16:00:00 via INTRAVENOUS

## 2017-10-29 MED ORDER — ACETAMINOPHEN 500 MG PO TABS
1000.0000 mg | ORAL_TABLET | Freq: Once | ORAL | Status: AC
  Administered 2017-10-29: 1000 mg via ORAL
  Filled 2017-10-29: qty 2

## 2017-10-29 NOTE — Discharge Instructions (Addendum)
You were seen in the ED for high blood pressure, shortness of breath and headache.  Your lab work all returned normal.  We discussed your hypertension with your OB doctor on call-Dr. Royston Sinner, who recommended that you TAKE ONE AND A HALF TABLET of your Procardia and follow-up with their office on Wednesday.  You can take Tylenol for your headache.  If you have any new or worsening symptoms you will need to return to be reevaluated.

## 2017-10-29 NOTE — ED Provider Notes (Signed)
[place] EMERGENCY DEPARTMENT Provider Note   CSN: [id number] Arrival date & time: 10/29/17  1443     History   Chief Complaint Chief Complaint  Patient presents with  . Hypertension  . Headache   HPI  Regina Foley is a 31 y.o. G68P1001 female at [redacted]w[redacted]d with h/o preexisting hypertension presenting with complaints of elevated blood pressure, headache and shortness of breath.  Around 9 AM this morning at work she began to have a headache.  Her headache is localized to the front of her head.  She does not have a history of migraines.  She took Tylenol 500 mg x 2 tablets which have not helped.  She attributed it to a side effect of her new blood pressure medication changes.  Her coworker measured her blood pressure today 154/80.  She called her OB office and was seen by Dr. Royston Sinner.  Of note, her OB Dr. Gaetano Net saw her last week for management of her hypertension.  Plan was to taper her off the labetalol and start Procardia 30 XL.  Yesterday was the first day she took the Procardia only.  She has a history of arrhythmias, SVT and reports she has been intubated several times with an unknown cause.  5 years ago was the last time she had a cardiac arrest and CPR had to be performed.  Dr. Royston Sinner recommended her to be evaluated at [facility].  Due to her cardiac history, she was sent to [facility] for further evaluation.  She denies fever, chills, abdominal pain.  No leg swelling, endorses nausea from her headache but has not vomited. Denies tobacco use.  No history of OCP use or recent immobilization.  No history of PE or DVT.    Past Medical History:  Diagnosis Date  . Cervical cancer ([place])   . Hypertension    Patient Active Problem List   Diagnosis Date Noted  . Tachycardia- history of PSVT with ? attempted RFA at [facility] Nov 2013 06/26/2013  . Altered mental status- syncope while in CT- intubated then extubated  06/26/2013  . Weakness generalized- presented to the ER  at [facility] with this complaint 06/26/13 06/26/2013  . Conversion reaction- reported final diagnosis from [facility] work up 06/26/2013   Past Surgical History:  Procedure Laterality Date  . APPENDECTOMY    . CHOLECYSTECTOMY    . Ovarian cyst     drained   OB History    Gravida Para Term Preterm AB Living   2 1 1     1    SAB TAB Ectopic Multiple Live Births                 Home Medications    Prior to Admission medications   Medication Sig Start Date End Date Taking? Authorizing Provider  atenolol (TENORMIN) 25 MG tablet Take 1 tablet (25 mg total) by mouth daily. 06/28/13   Caren Griffins, MD  promethazine (PHENERGAN) 12.5 MG tablet Take 1 tablet (12.5 mg total) by mouth every 6 (six) hours as needed for nausea. 06/28/13   Caren Griffins, MD   Family History History reviewed. No pertinent family history.  Social History Social History   Tobacco Use  . Smoking status: Never Smoker  . Smokeless tobacco: Never Used  Substance Use Topics  . Alcohol use: No    Frequency: Never  . Drug use: No   Allergies   Diclegis [doxylamine-pyridoxine]; Codeine; Compazine [prochlorperazine edisylate]; and Reglan [metoclopramide]  Review of Systems  Review of Systems  Constitutional: Negative for chills and fever.  Eyes: Negative for visual disturbance.  Respiratory: Positive for cough and shortness of breath. Negative for chest tightness and wheezing.   Cardiovascular: Negative for chest pain and leg swelling.  Gastrointestinal: Positive for nausea. Negative for abdominal pain, diarrhea and vomiting.  Skin: Negative for rash.  Neurological: Positive for headaches. Negative for weakness.   Physical Exam Updated Vital Signs BP (!) 144/94   Pulse (!) 101   Temp 98.3 F (36.8 C) (Oral)   Resp 20   LMP 07/05/2017   SpO2 100%   Physical Exam Gen- 31 yo female, NAD, comfortable appearing  Skin - warm, dry, no rash  HEENT - NCAT, EOMI, PERRL, MMM  Neck - supple, no significant  adenopathy Chest - clear to auscultation bilaterally, no wheezes, rales or rhonchi, symmetric air entry Heart - normal rate, regular rhythm, normal S1, S2, no murmurs, rubs, clicks or gallops Abdomen - soft, nontender, nondistended, no masses or organomegaly Musculoskeletal - no joint tenderness, deformity or swelling, normal strength, full range of motion without pain Neuro - face symmetric, patellar reflexes equal bilaterally  ED Treatments / Results  Labs (all labs ordered are listed, but only abnormal results are displayed) Labs Reviewed  URINALYSIS, ROUTINE W REFLEX MICROSCOPIC - Abnormal; Notable for the following components:      Result Value   APPearance CLOUDY (*)    All other components within normal limits  COMPREHENSIVE METABOLIC PANEL - Abnormal; Notable for the following components:   ALT 13 (*)    All other components within normal limits  CBC WITH DIFFERENTIAL/PLATELET - Abnormal; Notable for the following components:   WBC 11.1 (*)    Neutro Abs 8.4 (*)    All other components within normal limits  TROPONIN I  D-DIMER, QUANTITATIVE (NOT AT [facility])  PROTEIN / CREATININE RATIO, URINE  RAPID URINE DRUG SCREEN, HOSP PERFORMED   EKG  EKG Interpretation None      Radiology No results found.  Procedures Procedures (including critical care time)  Medications Ordered in ED Medications  0.9 %  sodium chloride infusion ( Intravenous New Bag/Given 10/29/17 1612)  acetaminophen (TYLENOL) tablet 1,000 mg (1,000 mg Oral Given 10/29/17 1612)   Initial Impression / Assessment and Plan / ED Course  I have reviewed the triage vital signs and the nursing notes.  Pertinent labs & imaging results that were available during my care of the patient were reviewed by me and considered in my medical decision making (see chart for details).  31 y/o G2P1001 female at [redacted]w[redacted]d presenting with headache, hypertension and shortness of breath.  Blood pressure elevated to 155/80 and vitals  otherwise stable.  UDS negative.  Normal d-dimer and troponin.  Protein/creatinine ratio within normal range.  Labs unremarkable with normal platelets and LFTs.   Discussed with OB on call - Dr. Royston Sinner and instructed to take 1.5 pills of her Procardia with follow up on Wednesday.  BP improved to 144/78 and patient comfortable appearing without signs of shortness of breath.   -Plan to discharge home  -Return precautions discussed  Final Clinical Impressions(s) / ED Diagnoses   Final diagnoses:  Chronic hypertension affecting pregnancy  Headache in pregnancy, antepartum, second trimester  Shortness of breath due to pregnancy in second trimester   ED Discharge Orders    None     Lovenia Kim, MD [place], PGY-2     Lovenia Kim, MD 10/29/17 [place],  MD 10/30/17 0120

## 2017-10-29 NOTE — MAU Note (Signed)
[facility] arrived to transport Pt to [facility] ED. Pt stable and alert, BP remains elevated, reported to [facility].

## 2017-10-29 NOTE — MAU Note (Signed)
Pt presents to MAU with c/o of headache, shortness of breath and has had elevated blood pressures today. Was sent from [facility] office. Pt denies vaginal bleeding and discharge.

## 2017-10-29 NOTE — MAU Provider Note (Signed)
History     CSN: [id number]  Arrival date and time: 10/29/17 1443   None     Chief Complaint  Patient presents with  . Hypertension  . Headache   HPI   [name] is a 31 y.o. female G2P1001 with a history of preexisting Hypertension @ 64w4dhere in MAU with complaints of elevated BP, headache and shortness of breath. States she was sent over from the office. She was seen in the office last week by Dr. TGaetano Netand changes were made to her BP medication at that time.  States she was taking labetalol and then was tapered off this medication and started on procardia 30 XL. Yesterday was the first day she took the procardia only. She feels her symptoms are a result of changes made to her BP medication.  She has a cardiac history including arrhythmias, SVT, and states she was intubated several times due to unknown cause. She went into cardiac arrest and had CPR performed several times in the past. This has not happened in the last 5 years, and [facility]  States she has a 7/10 headache that starts in the front of her head and radiates around to the back of her head. States she took 500 mg of tylenol which did not alleviate the headache. She denies a history of HA's or Migraines. States she feels short of breath. No pain with inhalation or exhalation, however feels like she is short of breath no matter what position she is in. Laying flat does not make the SOB worse. She denies history of PE or DVT.   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 Cervical cancer ([facility]   . Hypertension     Past Surgical History:  Procedure Laterality Date  . APPENDECTOMY    . CHOLECYSTECTOMY    . Ovarian cyst     drained    History reviewed. No pertinent family history.  Social History   Tobacco Use  . Smoking status: Never Smoker  . Smokeless tobacco: Never Used  Substance Use Topics  . Alcohol use: No    Frequency: Never  . Drug use: No    Allergies:  Allergies  Allergen Reactions  . Diclegis [Doxylamine-Pyridoxine] Anaphylaxis and Swelling  . Codeine     unknown  . Compazine [Prochlorperazine Edisylate]     unknown  . Reglan [Metoclopramide]     unknown    Medications Prior to Admission  Medication Sig Dispense Refill Last Dose  . atenolol (TENORMIN) 25 MG tablet Take 1 tablet (25 mg total) by mouth daily. 30 tablet 1   . promethazine (PHENERGAN) 12.5 MG tablet Take 1 tablet (12.5 mg total) by mouth every 6 (six) hours as needed for nausea. 30 tablet 0    Results for orders placed or performed during the  hospital encounter of 10/29/17 (from the past 48 hour(s))  Urinalysis, Routine w reflex microscopic     Status: Abnormal   Collection Time: 10/29/17  3:00 PM  Result Value Ref Range   Color, Urine YELLOW YELLOW   APPearance CLOUDY (A) CLEAR   Specific Gravity, Urine 1.019 1.005 - 1.030   pH 6.0 5.0 - 8.0   Glucose, UA NEGATIVE NEGATIVE mg/dL   Hgb urine dipstick NEGATIVE NEGATIVE   Bilirubin Urine NEGATIVE NEGATIVE   Ketones, ur NEGATIVE NEGATIVE mg/dL   Protein, ur NEGATIVE NEGATIVE mg/dL   Nitrite NEGATIVE NEGATIVE   Leukocytes, UA NEGATIVE NEGATIVE  Protein / creatinine ratio, urine     Status: None   Collection Time: 10/29/17  3:00 PM  Result Value Ref Range   Creatinine, Urine 134.00 mg/dL   Total Protein, Urine 7 mg/dL    Comment: NO NORMAL RANGE ESTABLISHED  FOR THIS TEST   Protein Creatinine Ratio 0.05 0.00 - 0.15 mg/mg[Cre]  Urine rapid drug screen (hosp performed)     Status: None   Collection Time: 10/29/17  3:00 PM  Result Value Ref Range   Opiates NONE DETECTED NONE DETECTED   Cocaine NONE DETECTED NONE DETECTED   Benzodiazepines NONE DETECTED NONE DETECTED   Amphetamines NONE DETECTED NONE DETECTED   Tetrahydrocannabinol NONE DETECTED NONE DETECTED   Barbiturates NONE DETECTED NONE DETECTED    Comment:        DRUG SCREEN FOR MEDICAL PURPOSES ONLY.  IF CONFIRMATION IS NEEDED FOR ANY PURPOSE, NOTIFY LAB WITHIN 5 DAYS.        LOWEST DETECTABLE LIMITS FOR URINE DRUG SCREEN Drug Class       Cutoff (ng/mL) Amphetamine      1000 Barbiturate      200 Benzodiazepine   858 Tricyclics       850 Opiates          300 Cocaine          300 THC              50   Comprehensive metabolic panel     Status: Abnormal   Collection Time: 10/29/17  4:00 PM  Result Value Ref Range   Sodium 137 135 - 145 mmol/L   Potassium 4.0 3.5 - 5.1 mmol/L   Chloride 105 101 - 111 mmol/L   CO2 24 22 - 32 mmol/L   Glucose, Bld 92 65 - 99 mg/dL   BUN 8 6 - 20 mg/dL   Creatinine, Ser 0.48 0.44 - 1.00 mg/dL   Calcium 9.6 8.9 - 10.3 mg/dL   Total Protein 6.8 6.5 - 8.1 g/dL   Albumin 3.5 3.5 - 5.0 g/dL   AST 19 15 - 41 U/L   ALT 13 (L) 14 - 54 U/L   Alkaline Phosphatase 48 38 - 126 U/L   Total Bilirubin 0.4 0.3 - 1.2 mg/dL   GFR calc non Af Amer >60 >60 mL/min   GFR calc Af Amer >60 >60 mL/min    Comment: (NOTE) The eGFR has been calculated using the CKD EPI equation. This calculation has not been validated in all clinical situations. eGFR's persistently <60 mL/min signify possible Chronic Kidney Disease.    Anion gap 8 5 - 15  CBC with Differential     Status: Abnormal   Collection Time: 10/29/17  4:00 PM  Result Value Ref Range   WBC 11.1 (H) 4.0 - 10.5 K/uL   RBC 4.02 3.87 - 5.11 MIL/uL   Hemoglobin 12.2  12.0 - 15.0 g/dL   HCT 36.1 36.0 - 46.0 %    MCV 89.8 78.0 - 100.0 fL   MCH 30.3 26.0 - 34.0 pg   MCHC 33.8 30.0 - 36.0 g/dL   RDW 12.4 11.5 - 15.5 %   Platelets 204 150 - 400 K/uL   Neutrophils Relative % 76 %   Neutro Abs 8.4 (H) 1.7 - 7.7 K/uL   Lymphocytes Relative 20 %   Lymphs Abs 2.3 0.7 - 4.0 K/uL   Monocytes Relative 3 %   Monocytes Absolute 0.3 0.1 - 1.0 K/uL   Eosinophils Relative 1 %   Eosinophils Absolute 0.1 0.0 - 0.7 K/uL   Basophils Relative 0 %   Basophils Absolute 0.0 0.0 - 0.1 K/uL  Troponin I     Status: None   Collection Time: 10/29/17  4:00 PM  Result Value Ref Range   Troponin I <0.03 <0.03 ng/mL  D-dimer, quantitative (not at [facility])     Status: None   Collection Time: 10/29/17  4:00 PM  Result Value Ref Range   D-Dimer, Quant <0.27 0.00 - 0.50 ug/mL-FEU    Comment: REPEATED TO VERIFY (NOTE) At the manufacturer cut-off of 0.50 ug/mL FEU, this assay has been documented to exclude PE with a sensitivity and negative predictive value of 97 to 99%.  At this time, this assay has not been approved by the FDA to exclude DVT/VTE. Results should be correlated with clinical presentation.     Review of Systems  Eyes: Positive for visual disturbance.  Gastrointestinal: Negative for abdominal pain.  Neurological: Positive for headaches.   Physical Exam   Blood pressure (!) 149/92, pulse (!) 108, temperature 98.3 F (36.8 C), temperature source Oral, resp. rate (!) 22, last menstrual period 07/05/2017.   Patient Vitals for the past 24 hrs:  BP Temp Temp src Pulse Resp SpO2  10/29/17 1852 - 98 F (36.7 C) - - - -  10/29/17 1838 (!) 132/97 - - 94 - 99 %  10/29/17 1800 (!) 144/94 - - (!) 101 - 100 %  10/29/17 1745 (!) 155/88 - - - - -  10/29/17 1627 (!) 146/97 - - 88 - 100 %  10/29/17 1626 (!) 146/97 98.3 F (36.8 C) Oral 93 20 -  10/29/17 1617 (!) 144/78 - - (!) 103 - 100 %  10/29/17 1601 (!) 154/105 - - (!) 110 - -  10/29/17 1531 (!) 159/92 - - (!) 106 - -  10/29/17 1516 (!) 149/92 - - (!) 108 -  -  10/29/17 1507 (!) 146/109 - - (!) 114 - -  10/29/17 1506 (!) 143/102 98.3 F (36.8 C) Oral 100 (!) 22 -   Physical Exam  Constitutional: She is oriented to person, place, and time. She appears well-developed and well-nourished.  Non-toxic appearance. She does not have a sickly appearance. She does not appear ill. No distress.  HENT:  Head: Normocephalic.  Eyes: Pupils are equal, round, and reactive to light.  Neck: Normal range of motion. Neck supple.  Cardiovascular: Normal rate and regular rhythm.  Respiratory: Effort normal and breath sounds normal. No respiratory distress. She has no wheezes. She has no rales.  GI: Soft. She exhibits no distension. There is no tenderness. There is no rebound and no guarding.  Musculoskeletal: Normal range of motion.  Neurological: She is alert and oriented to person, place, and time.  Skin: Skin is warm. She is not diaphoretic.  Psychiatric: Her behavior is normal.   MAU  Course  Procedures  None  MDM  + fetal heart tones via doppler  Pulse ox 100 % on RA EKG NSR Troponin X1 D-Dimer pending  CMP, CBC ordered Discussed patient with Dr. Royston Sinner, given the patient's medical history it is recommended that she be transferred to [facility] for further workup. Patient is stable at this time and in no distress. Patient is agreeable to transfer.  Discussed patient with Dr. Laverta Baltimore at [facility] ED who is agreeable to the transfer.    Assessment and Plan   A:  1. Chronic hypertension affecting pregnancy   2. Headache in pregnancy, antepartum, second trimester   3. Shortness of breath due to pregnancy in second trimester     P:  Transfer to [facility] ED for further cardiac/pulmonary workup   Avrey Flanagin, Artist Pais, NP 10/29/2017 7:53 PM

## 2017-11-08 ENCOUNTER — Encounter: Payer: Self-pay | Admitting: *Deleted

## 2017-11-14 LAB — CYTOLOGY - PAP: Pap: NEGATIVE

## 2017-11-26 ENCOUNTER — Encounter: Payer: Self-pay | Admitting: Obstetrics and Gynecology

## 2017-11-26 ENCOUNTER — Ambulatory Visit (INDEPENDENT_AMBULATORY_CARE_PROVIDER_SITE_OTHER): Payer: Managed Care, Other (non HMO) | Admitting: Obstetrics and Gynecology

## 2017-11-26 VITALS — BP 130/84 | HR 94 | Wt 199.4 lb

## 2017-11-26 DIAGNOSIS — Z8679 Personal history of other diseases of the circulatory system: Secondary | ICD-10-CM

## 2017-11-26 DIAGNOSIS — Z8674 Personal history of sudden cardiac arrest: Secondary | ICD-10-CM

## 2017-11-26 DIAGNOSIS — O0992 Supervision of high risk pregnancy, unspecified, second trimester: Secondary | ICD-10-CM

## 2017-11-26 DIAGNOSIS — O10912 Unspecified pre-existing hypertension complicating pregnancy, second trimester: Secondary | ICD-10-CM

## 2017-11-26 DIAGNOSIS — O0990 Supervision of high risk pregnancy, unspecified, unspecified trimester: Secondary | ICD-10-CM

## 2017-11-26 DIAGNOSIS — O10919 Unspecified pre-existing hypertension complicating pregnancy, unspecified trimester: Secondary | ICD-10-CM

## 2017-11-26 DIAGNOSIS — Z9889 Other specified postprocedural states: Secondary | ICD-10-CM

## 2017-11-26 MED ORDER — ASPIRIN EC 81 MG PO TBEC: 81.0000 mg | DELAYED_RELEASE_TABLET | Freq: Every day | ORAL | 2 refills | Status: DC

## 2017-11-26 NOTE — Progress Notes (Signed)
   PRENATAL VISIT NOTE  Subjective:  Regina Foley is a 31 y.o. G2P1001 at [redacted]w[redacted]d being seen today for ongoing prenatal care.  She is currently monitored for the following issues for this high-risk pregnancy and has Tachycardia- history of PSVT with ? attempted RFA at [facility] Nov 2013; Altered mental status- syncope while in CT- intubated then extubated ; Weakness generalized- presented to the ER at [facility] with this complaint 06/26/13; Conversion reaction- reported final diagnosis from [place] work up; Supervision of high risk pregnancy, antepartum; Chronic hypertension in pregnancy; History of cardiac arrest; and History of supraventricular tachycardia on their problem list.  Patient reports no complaints.  Contractions: Not present. Vag. Bleeding: None.  Movement: Present. Denies leaking of fluid.   On labetalol 200 mg BID Had severe headaches with procardia H/o cHTN on bystolic and amlodipine under good control  The following portions of the patient's history were reviewed and updated as appropriate: allergies, current medications, past family history, past medical history, past social history, past surgical history and problem list. Problem list updated.  Objective:   Vitals:   11/26/17 1547  BP: 130/84  Pulse: 94  Weight: 199 lb 6.4 oz (90.4 kg)    Fetal Status: Fetal Heart Rate (bpm): 150   Movement: Present     General:  Alert, oriented and cooperative. Patient is in no acute distress.  Skin: Skin is warm and dry. No rash noted.   Cardiovascular: Normal heart rate noted  Respiratory: Normal respiratory effort, no problems with respiration noted  Abdomen: Soft, gravid, appropriate for gestational age.  Pain/Pressure: Present     Pelvic: Cervical exam deferred        Extremities: Normal range of motion.  Edema: None  Mental Status:  Normal mood and affect. Normal behavior. Normal judgment and thought content.   Assessment and Plan:  Pregnancy: G2P1001 at [redacted]w[redacted]d  1. Supervision of  high risk pregnancy, antepartum - Enroll Patient in Babyscripts Decreased appetite  2. Chronic hypertension in pregnancy Baseline labs wnl (done at MAU in 10/2017) Cont labetalol 200 mg BID To start baby ASA  3. History of cardiac arrest Per patient, multiple arrests after SVT episodes, ~2013  4. History of supraventricular tachycardia Episodes 1/month Patient to reestablish care with cardiology in [place]  5. H/O LEEP Reports normal CL on Friday at [facility] Trans[facility]aginal; Future   Preterm labor symptoms and general obstetric precautions including but not limited to vaginal bleeding, contractions, leaking of fluid and fetal movement were reviewed in detail with the patient. Please refer to After Visit Summary for other counseling recommendations.  Return in about 2 weeks (around 12/10/2017) for OB visit (MD).   Sloan Leiter, MD

## 2017-11-26 NOTE — Progress Notes (Signed)
Here for new ob visit. Transferring care form other provider. Given new patient education packet.

## 2017-11-26 NOTE — Progress Notes (Signed)
OB US scheduled for 11/30/17 @ 0830.  Pt notified.

## 2017-11-27 ENCOUNTER — Encounter (HOSPITAL_COMMUNITY): Payer: Self-pay

## 2017-11-30 ENCOUNTER — Other Ambulatory Visit (HOSPITAL_COMMUNITY): Payer: Self-pay | Admitting: *Deleted

## 2017-11-30 ENCOUNTER — Other Ambulatory Visit: Payer: Self-pay | Admitting: Obstetrics and Gynecology

## 2017-11-30 ENCOUNTER — Encounter (HOSPITAL_COMMUNITY): Payer: Self-pay

## 2017-11-30 ENCOUNTER — Ambulatory Visit (HOSPITAL_COMMUNITY)
Admission: RE | Admit: 2017-11-30 | Discharge: 2017-11-30 | Disposition: A | Discharge status: Home / Self Care | Payer: Managed Care, Other (non HMO) | Source: Ambulatory Visit | Attending: Obstetrics and Gynecology | Admitting: Obstetrics and Gynecology

## 2017-11-30 DIAGNOSIS — Z9889 Other specified postprocedural states: Secondary | ICD-10-CM

## 2017-11-30 DIAGNOSIS — Z3686 Encounter for antenatal screening for cervical length: Secondary | ICD-10-CM | POA: Diagnosis not present

## 2017-11-30 DIAGNOSIS — Z3689 Encounter for other specified antenatal screening: Secondary | ICD-10-CM

## 2017-11-30 DIAGNOSIS — Z3A21 21 weeks gestation of pregnancy: Secondary | ICD-10-CM

## 2017-11-30 DIAGNOSIS — O99212 Obesity complicating pregnancy, second trimester: Secondary | ICD-10-CM

## 2017-11-30 DIAGNOSIS — O10012 Pre-existing essential hypertension complicating pregnancy, second trimester: Secondary | ICD-10-CM | POA: Diagnosis not present

## 2017-11-30 DIAGNOSIS — O10919 Unspecified pre-existing hypertension complicating pregnancy, unspecified trimester: Secondary | ICD-10-CM

## 2017-11-30 DIAGNOSIS — O3442 Maternal care for other abnormalities of cervix, second trimester: Secondary | ICD-10-CM

## 2017-11-30 DIAGNOSIS — O0990 Supervision of high risk pregnancy, unspecified, unspecified trimester: Secondary | ICD-10-CM

## 2017-12-11 NOTE — L&D Delivery Note (Signed)
Delivery Note At 11:50 PM a viable female was delivered via Vaginal, Spontaneous (Presentation: ROA with bilateral compound arms;  ).  APGAR: 9,9 ; weight  pending Placenta status:intact , .  Cord: 3 vessel  with the following complications: .  Cord pH: 7.39 arterial  Anesthesia:  epidural Episiotomy: None Lacerations:  none Suture Repair: not applicable Est. Blood Loss (mL):  1250 cc  Mom to postpartum. With Magnesium sulfate prophylaxis  Baby to Couplet care / Skin to Skin.  Pt has undergone a 3 day induction and is on magnesium sulfate And while I was talking about postpartum hemorrhage prevention with the patient she mentioned she actually had a postpartum hemorrhage with her last baby, 10 years ago  I gave her methergine immediately after delivery prophylactically, 15 minutes later gave her 250 micrograms of hemabate, 15 minutes later gave her 400 micrograms cytotec buccally and 400 micrograms orally.  I did aggressive massage and clot clearance every 5 minutes  I then ordered 1 gram of tranexamic acid with her history and current blood loss level and probable ongoing suboptimal uterine tone from the magnesium.  Her vitals remain stable and urine output continues  Will check a CBC with Mag level at noon unless otherwise clinically indicated  Florian Buff 03/16/2018, 1:01 AM

## 2017-12-13 ENCOUNTER — Encounter: Payer: Self-pay | Admitting: Obstetrics and Gynecology

## 2017-12-13 ENCOUNTER — Ambulatory Visit (INDEPENDENT_AMBULATORY_CARE_PROVIDER_SITE_OTHER): Payer: Managed Care, Other (non HMO) | Admitting: Obstetrics and Gynecology

## 2017-12-13 VITALS — BP 155/85 | HR 77 | Wt 204.0 lb

## 2017-12-13 DIAGNOSIS — Z9889 Other specified postprocedural states: Secondary | ICD-10-CM

## 2017-12-13 DIAGNOSIS — K219 Gastro-esophageal reflux disease without esophagitis: Secondary | ICD-10-CM

## 2017-12-13 DIAGNOSIS — Z8674 Personal history of sudden cardiac arrest: Secondary | ICD-10-CM

## 2017-12-13 DIAGNOSIS — N898 Other specified noninflammatory disorders of vagina: Secondary | ICD-10-CM

## 2017-12-13 DIAGNOSIS — Z8679 Personal history of other diseases of the circulatory system: Secondary | ICD-10-CM

## 2017-12-13 DIAGNOSIS — O10919 Unspecified pre-existing hypertension complicating pregnancy, unspecified trimester: Secondary | ICD-10-CM

## 2017-12-13 DIAGNOSIS — Z113 Encounter for screening for infections with a predominantly sexual mode of transmission: Secondary | ICD-10-CM

## 2017-12-13 DIAGNOSIS — O0990 Supervision of high risk pregnancy, unspecified, unspecified trimester: Secondary | ICD-10-CM

## 2017-12-13 MED ORDER — RANITIDINE HCL 150 MG PO TABS: 150.0000 mg | ORAL_TABLET | Freq: Two times a day (BID) | ORAL | 1 refills | Status: DC

## 2017-12-13 NOTE — Progress Notes (Signed)
Subjective:  Regina Foley is a 32 y.o. G2P1001 at [redacted]w[redacted]d being seen today for ongoing prenatal care.  She is currently monitored for the following issues for this high-risk pregnancy and has Tachycardia- history of PSVT with ? attempted RFA at [facility] Nov 2013; Altered mental status- syncope while in CT- intubated then extubated ; Conversion reaction- reported final diagnosis from [place] work up; Supervision of high risk pregnancy, antepartum; Chronic hypertension in pregnancy; History of cardiac arrest; History of supraventricular tachycardia; and H/O LEEP on their problem list.  Patient reports N/V, heart palpations, SOB and GERD. Marland Kitchen  Contractions: Not present. Vag. Bleeding: None.  Movement: Present. Denies leaking of fluid.   The following portions of the patient's history were reviewed and updated as appropriate: allergies, current medications, past family history, past medical history, past social history, past surgical history and problem list. Problem list updated.  Objective:   Vitals:   12/13/17 1617  BP: (!) 155/85  Pulse: 77  Weight: 92.5 kg (204 lb)    Fetal Status: Fetal Heart Rate (bpm): 152   Movement: Present     General:  Alert, oriented and cooperative. Patient is in no acute distress.  Skin: Skin is warm and dry. No rash noted.   Cardiovascular: Normal heart rate noted  Respiratory: Normal respiratory effort, no problems with respiration noted  Abdomen: Soft, gravid, appropriate for gestational age. Pain/Pressure: Present     Pelvic:  Cervical exam deferred        Extremities: Normal range of motion.  Edema: Trace  Mental Status: Normal mood and affect. Normal behavior. Normal judgment and thought content.   Urinalysis:      Assessment and Plan:  Pregnancy: G2P1001 at [redacted]w[redacted]d  1. Chronic hypertension in pregnancy Saw cardiology last week and they added Aldomet 250 mg bid. Continue with BASA and Labetalol   2. H/O LEEP Nl CL on 11/30/17 U/S  3. Supervision of high  risk pregnancy, antepartum Stable  4. History of supraventricular tachycardia Has seen cardiology and tests in process Has f/u appt next week  5. History of cardiac arrest See above  6. Vaginal itching Collect self swab today - Cervicovaginal ancillary only  Preterm labor symptoms and general obstetric precautions including but not limited to vaginal bleeding, contractions, leaking of fluid and fetal movement were reviewed in detail with the patient. Please refer to After Visit Summary for other counseling recommendations.  Return in about 2 weeks (around 12/27/2017) for OB visit.   Chancy Milroy, MD

## 2017-12-13 NOTE — Progress Notes (Signed)
C/o occasional itching, no discharge Poor appetite

## 2017-12-17 LAB — CERVICOVAGINAL ANCILLARY ONLY
BACTERIAL VAGINITIS: NEGATIVE
CANDIDA VAGINITIS: POSITIVE — AB
Trichomonas: NEGATIVE

## 2017-12-21 ENCOUNTER — Telehealth: Payer: Self-pay | Admitting: *Deleted

## 2017-12-21 MED ORDER — TERCONAZOLE 0.4 % VA CREA: 1.0000 | TOPICAL_CREAM | Freq: Every day | VAGINAL | 0 refills | Status: AC

## 2017-12-21 NOTE — Telephone Encounter (Signed)
Called pt and informed her of test results showing +vaginal yeast infection. Treatment has been prescribed and will be sent to her pharmacy. Pt asked why it has taken a week to be called with these results. She further stated that since we are a high risk Ob-Gyn office she is unhappy and frustrated with the time it has taken to get the results. I apologized for the delay and explained that we try to call pts as soon as possible with results however at times there can be a delay for various reasons. These reasons can be related to the doctor's schedule and availability to review the results as well as our nurse's ability to call pts in a timely manner. I once again apologized for any inconvenience she has incurred. Pt continued by telling me that she was not happy with the care she received on the [place] of her last appt. She stated that the doctor didn't do any exam and "just stood in the corner with his arms folded. He didn't even tell me the results of my 2hr blood sugar test when I asked him. He sent the nurse back in to tell me the results."  I told pt that I was sorry for her experience, that I hoped we would do better at her next visit and she is scheduled to see a different doctor at that visit. Pt also expressed displeasure for her long wait time to be seen.  I stated that I would share her comments with my nursing supervisor with her permission.  Pt said, "Yes please". Finally, I reviewed dosage instructions of her prescription. She voiced understanding.

## 2017-12-26 ENCOUNTER — Encounter: Payer: Self-pay | Admitting: *Deleted

## 2017-12-28 ENCOUNTER — Encounter (HOSPITAL_COMMUNITY): Payer: Self-pay

## 2017-12-28 ENCOUNTER — Ambulatory Visit (HOSPITAL_COMMUNITY)
Admission: RE | Admit: 2017-12-28 | Discharge: 2017-12-28 | Disposition: A | Discharge status: Home / Self Care | Payer: Managed Care, Other (non HMO) | Source: Ambulatory Visit | Attending: Obstetrics and Gynecology | Admitting: Obstetrics and Gynecology

## 2017-12-28 ENCOUNTER — Other Ambulatory Visit (HOSPITAL_COMMUNITY): Payer: Self-pay | Admitting: Maternal and Fetal Medicine

## 2017-12-28 ENCOUNTER — Other Ambulatory Visit (HOSPITAL_COMMUNITY): Payer: Self-pay | Admitting: *Deleted

## 2017-12-28 ENCOUNTER — Ambulatory Visit (INDEPENDENT_AMBULATORY_CARE_PROVIDER_SITE_OTHER): Payer: Managed Care, Other (non HMO) | Admitting: Family Medicine

## 2017-12-28 VITALS — BP 140/96 | HR 107 | Wt 203.0 lb

## 2017-12-28 DIAGNOSIS — I471 Supraventricular tachycardia, unspecified: Secondary | ICD-10-CM

## 2017-12-28 DIAGNOSIS — D509 Iron deficiency anemia, unspecified: Secondary | ICD-10-CM

## 2017-12-28 DIAGNOSIS — O10919 Unspecified pre-existing hypertension complicating pregnancy, unspecified trimester: Secondary | ICD-10-CM

## 2017-12-28 DIAGNOSIS — Z8759 Personal history of other complications of pregnancy, childbirth and the puerperium: Secondary | ICD-10-CM | POA: Diagnosis not present

## 2017-12-28 DIAGNOSIS — O10912 Unspecified pre-existing hypertension complicating pregnancy, second trimester: Secondary | ICD-10-CM | POA: Insufficient documentation

## 2017-12-28 DIAGNOSIS — O0990 Supervision of high risk pregnancy, unspecified, unspecified trimester: Secondary | ICD-10-CM

## 2017-12-28 DIAGNOSIS — O99212 Obesity complicating pregnancy, second trimester: Secondary | ICD-10-CM

## 2017-12-28 DIAGNOSIS — O9942 Diseases of the circulatory system complicating childbirth: Secondary | ICD-10-CM | POA: Diagnosis not present

## 2017-12-28 DIAGNOSIS — Z3A25 25 weeks gestation of pregnancy: Secondary | ICD-10-CM

## 2017-12-28 DIAGNOSIS — N949 Unspecified condition associated with female genital organs and menstrual cycle: Secondary | ICD-10-CM

## 2017-12-28 DIAGNOSIS — E669 Obesity, unspecified: Secondary | ICD-10-CM | POA: Insufficient documentation

## 2017-12-28 DIAGNOSIS — D649 Anemia, unspecified: Secondary | ICD-10-CM | POA: Insufficient documentation

## 2017-12-28 DIAGNOSIS — O0992 Supervision of high risk pregnancy, unspecified, second trimester: Secondary | ICD-10-CM

## 2017-12-28 NOTE — Patient Instructions (Signed)

## 2017-12-28 NOTE — Addendum Note (Signed)
Addended by: Elta Guadeloupe on: 12/28/2017 02:31 PM   Modules accepted: Orders

## 2017-12-28 NOTE — Addendum Note (Signed)
Encounter addended by: Hilda Lias, RT on: 12/28/2017 11:38 AM  Actions taken: Imaging Exam ended

## 2017-12-28 NOTE — Progress Notes (Signed)
   PRENATAL VISIT NOTE  Subjective:  Regina Foley is a 32 y.o. G2P1001 at [redacted]w[redacted]d being seen today for ongoing prenatal care.  She is currently monitored for the following issues for this high-risk pregnancy and has Tachycardia- history of PSVT with ? attempted [facility]FA at [facility] 2013; Altered mental status- syncope while in CT- intubated then extubated ; Conversion reaction- reported final diagnosis from [place] work up; Supervision of high risk pregnancy, antepartum; Chronic hypertension in pregnancy; History of cardiac arrest; History of supraventricular tachycardia; and H/O LEEP on their problem list.  Patient reports bilateral lower pelvic discomfort that is worse when she stands all day. takes tylenol and this helps. denies vaginal bleeding, vaginal discharge, loss of fluid, or contractions..  Contractions: Not present. Vag. Bleeding: None.  Movement: Present. Denies leaking of fluid.   The following portions of the patient's history were reviewed and updated as appropriate: allergies, current medications, past family history, past medical history, past social history, past surgical history and problem list. Problem list updated.  Objective:   Vitals:   12/28/17 1338  BP: (!) 140/96  Pulse: (!) 107  Weight: 92.1 kg (203 lb)    Fetal Status: Fetal Heart Rate (bpm): 152 Fundal Height: 25 cm Movement: Present     General:  Alert, oriented and cooperative. Patient is in no acute distress.  Skin: Skin is warm and dry. No rash noted.   Cardiovascular: Normal heart rate noted  Respiratory: Normal respiratory effort, no problems with respiration noted  Abdomen: Soft, gravid, appropriate for gestational age.  Pain/Pressure: Absent     Pelvic: Cervical exam deferred        Extremities: Normal range of motion.  Edema: None  Mental Status:  Normal mood and affect. Normal behavior. Normal judgment and thought content.   Assessment and Plan:  Pregnancy: G2P1001 at [redacted]w[redacted]d  1. Supervision of high risk  pregnancy, antepartum Follow up in 3 weeks  2. Chronic hypertension in pregnancy Stable. Improved from last visit. Continue current meds.  3. Anemia- patient reports she checked her blood at work and hgb was 10. Wants to start iron daily. This is probably ok for her. Will get cbc today.  4. Round ligament pain- tylenol prn  Preterm labor symptoms and general obstetric precautions including but not limited to vaginal bleeding, contractions, leaking of fluid and fetal movement were reviewed in detail with the patient. Please refer to After Visit Summary for other counseling recommendations.  Return in about 3 weeks (around 01/18/2018).   Dannielle Huh, DO

## 2017-12-28 NOTE — Progress Notes (Signed)
Pt stated

## 2017-12-29 LAB — CBC
Hematocrit: 35.6 % (ref 34.0–46.6)
Hemoglobin: 12.4 g/dL (ref 11.1–15.9)
MCH: 29.9 pg (ref 26.6–33.0)
MCHC: 34.8 g/dL (ref 31.5–35.7)
MCV: 86 fL (ref 79–97)
Platelets: 248 10*3/uL (ref 150–379)
RBC: 4.15 x10E6/uL (ref 3.77–5.28)
RDW: 12.9 % (ref 12.3–15.4)
WBC: 12.4 10*3/uL — AB (ref 3.4–10.8)

## 2018-01-02 ENCOUNTER — Telehealth: Payer: Self-pay | Admitting: *Deleted

## 2018-01-02 NOTE — Telephone Encounter (Signed)
Called pt and informed her of normal Hgb level from 1/18. She does not need to take daily iron supplement as previously discussed with Dr. Manus Rudd. If she desires to take it anyway, she should be aware of the increased chance of developing constipation.  Pt voiced understanding and expressed gratitude for the information.

## 2018-01-21 ENCOUNTER — Encounter: Payer: Self-pay | Admitting: Obstetrics and Gynecology

## 2018-01-21 ENCOUNTER — Other Ambulatory Visit (HOSPITAL_COMMUNITY)
Admission: RE | Admit: 2018-01-21 | Discharge: 2018-01-21 | Disposition: A | Discharge status: Home / Self Care | Payer: Managed Care, Other (non HMO) | Source: Ambulatory Visit | Attending: Obstetrics and Gynecology | Admitting: Obstetrics and Gynecology

## 2018-01-21 ENCOUNTER — Ambulatory Visit (INDEPENDENT_AMBULATORY_CARE_PROVIDER_SITE_OTHER): Payer: Managed Care, Other (non HMO) | Admitting: Obstetrics and Gynecology

## 2018-01-21 ENCOUNTER — Encounter (HOSPITAL_COMMUNITY): Payer: Self-pay

## 2018-01-21 ENCOUNTER — Ambulatory Visit (HOSPITAL_COMMUNITY)
Admission: RE | Admit: 2018-01-21 | Discharge: 2018-01-21 | Disposition: A | Discharge status: Home / Self Care | Payer: Managed Care, Other (non HMO) | Source: Ambulatory Visit | Attending: Obstetrics and Gynecology | Admitting: Obstetrics and Gynecology

## 2018-01-21 VITALS — BP 148/102 | HR 105 | Wt 207.2 lb

## 2018-01-21 DIAGNOSIS — Z3A00 Weeks of gestation of pregnancy not specified: Secondary | ICD-10-CM | POA: Diagnosis not present

## 2018-01-21 DIAGNOSIS — R110 Nausea: Secondary | ICD-10-CM

## 2018-01-21 DIAGNOSIS — Z23 Encounter for immunization: Secondary | ICD-10-CM

## 2018-01-21 DIAGNOSIS — O10013 Pre-existing essential hypertension complicating pregnancy, third trimester: Secondary | ICD-10-CM | POA: Insufficient documentation

## 2018-01-21 DIAGNOSIS — Z9889 Other specified postprocedural states: Secondary | ICD-10-CM

## 2018-01-21 DIAGNOSIS — O0990 Supervision of high risk pregnancy, unspecified, unspecified trimester: Secondary | ICD-10-CM

## 2018-01-21 DIAGNOSIS — B373 Candidiasis of vulva and vagina: Secondary | ICD-10-CM | POA: Insufficient documentation

## 2018-01-21 DIAGNOSIS — Z8679 Personal history of other diseases of the circulatory system: Secondary | ICD-10-CM

## 2018-01-21 DIAGNOSIS — O98819 Other maternal infectious and parasitic diseases complicating pregnancy, unspecified trimester: Secondary | ICD-10-CM | POA: Diagnosis not present

## 2018-01-21 DIAGNOSIS — O10919 Unspecified pre-existing hypertension complicating pregnancy, unspecified trimester: Secondary | ICD-10-CM

## 2018-01-21 DIAGNOSIS — N898 Other specified noninflammatory disorders of vagina: Secondary | ICD-10-CM

## 2018-01-21 DIAGNOSIS — Z8674 Personal history of sudden cardiac arrest: Secondary | ICD-10-CM

## 2018-01-21 DIAGNOSIS — O10913 Unspecified pre-existing hypertension complicating pregnancy, third trimester: Secondary | ICD-10-CM

## 2018-01-21 DIAGNOSIS — E669 Obesity, unspecified: Secondary | ICD-10-CM | POA: Insufficient documentation

## 2018-01-21 DIAGNOSIS — Z362 Encounter for other antenatal screening follow-up: Secondary | ICD-10-CM | POA: Insufficient documentation

## 2018-01-21 DIAGNOSIS — Z3A28 28 weeks gestation of pregnancy: Secondary | ICD-10-CM | POA: Insufficient documentation

## 2018-01-21 DIAGNOSIS — O99212 Obesity complicating pregnancy, second trimester: Secondary | ICD-10-CM | POA: Insufficient documentation

## 2018-01-21 DIAGNOSIS — O0993 Supervision of high risk pregnancy, unspecified, third trimester: Secondary | ICD-10-CM

## 2018-01-21 DIAGNOSIS — B002 Herpesviral gingivostomatitis and pharyngotonsillitis: Secondary | ICD-10-CM

## 2018-01-21 MED ORDER — PROMETHAZINE HCL 6.25 MG/5ML PO SYRP: 6.2500 mg | ORAL_SOLUTION | Freq: Four times a day (QID) | ORAL | 0 refills | Status: DC | PRN

## 2018-01-21 MED ORDER — VALACYCLOVIR HCL 1 G PO TABS: 1000.0000 mg | ORAL_TABLET | Freq: Every day | ORAL | 2 refills | Status: DC

## 2018-01-21 NOTE — Progress Notes (Signed)
Pt states she thinks she has another yeast infection again, prefers pills instead of cream.Pt is having vaginal itching but no d/c. Tdap accepted, given on 01/21/18 @9 :17.

## 2018-01-21 NOTE — Progress Notes (Signed)
   PRENATAL VISIT NOTE  Subjective:  Regina Foley is a 32 y.o. G2P1001 at [redacted]w[redacted]d being seen today for ongoing prenatal care.  She is currently monitored for the following issues for this high-risk pregnancy and has Tachycardia- history of PSVT with ? attempted RFA at [facility] [place] Nov 2013; Altered mental status- syncope while in [place]- intubated then extubated ; Conversion reaction- reported final diagnosis from [place] work up; Supervision of high risk pregnancy, antepartum; Chronic hypertension in pregnancy; History of cardiac arrest; History of supraventricular tachycardia; H/O LEEP; Anemia; Round ligament pain; and Oral herpes on their problem list.  Patient reports occasional cramping.  Contractions: Not present. Vag. Bleeding: None.  Movement: Present. Denies leaking of fluid. Some vaginal itching, doesn't think the terconazole worked for her yeast infection.  The following portions of the patient's history were reviewed and updated as appropriate: allergies, current medications, past family history, past medical history, past social history, past surgical history and problem list. Problem list updated.  Objective:   Vitals:   01/21/18 0905  BP: (!) 148/102  Pulse: (!) 105  Weight: 207 lb 3.2 oz (94 kg)  repeat 144/96  Fetal Status: Fetal Heart Rate (bpm): 138   Movement: Present     General:  Alert, oriented and cooperative. Patient is in no acute distress.  Skin: Skin is warm and dry. No rash noted.   Cardiovascular: Normal heart rate noted  Respiratory: Normal respiratory effort, no problems with respiration noted  Abdomen: Soft, gravid, appropriate for gestational age.  Pain/Pressure: Present     Pelvic: Cervical exam deferred        Extremities: Normal range of motion.  Edema: Trace  Mental Status:  Normal mood and affect. Normal behavior. Normal judgment and thought content.   Assessment and Plan:  Pregnancy: G2P1001 at [redacted]w[redacted]d  1. Supervision of high risk pregnancy, antepartum Tdap  today Will repeat GTT in 2 weeks  2. History of cardiac arrest Please see below  3. History of supraventricular tachycardia Has been cleared by cardiologist, reports on Holter she had "a couple runs of SVT" and the echo was unremarkable but otherwise no issues and she has been cleared, doesn't need to go back to see them - will sign release of records to get cardiology notes  4. H/O LEEP  5. Chronic hypertension in pregnancy Stable, labetalol 200 mg TID, aldomet 250 mg BID Cont baby ASA  6. Nausea Phenergan sent to pharmacy  7. Vaginal itching Will swab today  8. Oral herpes Valtrex for recurrent sent today Denies vaginal symptoms   Preterm labor symptoms and general obstetric precautions including but not limited to vaginal bleeding, contractions, leaking of fluid and fetal movement were reviewed in detail with the patient. Please refer to After Visit Summary for other counseling recommendations.  Return in about 2 weeks (around 02/04/2018) for OB visit (MD), 2 hr GTT.   Sloan Leiter, MD

## 2018-01-21 NOTE — Progress Notes (Signed)
Patient left prior to getting blood drawn, will draw at next visit

## 2018-01-21 NOTE — Progress Notes (Signed)
BP-RECHECK:144/96 101

## 2018-01-21 NOTE — Addendum Note (Signed)
Addended by: Shelly Coss on: 01/21/2018 03:49 PM   Modules accepted: Orders

## 2018-01-22 LAB — CERVICOVAGINAL ANCILLARY ONLY
Bacterial vaginitis: NEGATIVE
Candida vaginitis: POSITIVE — AB
Chlamydia: NEGATIVE
Neisseria Gonorrhea: NEGATIVE
TRICH (WINDOWPATH): NEGATIVE

## 2018-01-23 ENCOUNTER — Other Ambulatory Visit: Payer: Self-pay | Admitting: Obstetrics and Gynecology

## 2018-01-23 MED ORDER — CLOTRIMAZOLE 1 % VA CREA: 1.0000 | TOPICAL_CREAM | Freq: Every day | VAGINAL | 2 refills | Status: DC

## 2018-01-23 NOTE — Progress Notes (Signed)
Script for yeast sent to [facility]

## 2018-01-25 ENCOUNTER — Ambulatory Visit (HOSPITAL_COMMUNITY): Payer: Self-pay

## 2018-01-25 ENCOUNTER — Encounter: Payer: Self-pay | Admitting: Obstetrics and Gynecology

## 2018-01-29 ENCOUNTER — Encounter: Payer: Self-pay | Admitting: *Deleted

## 2018-02-04 ENCOUNTER — Ambulatory Visit (INDEPENDENT_AMBULATORY_CARE_PROVIDER_SITE_OTHER): Payer: Managed Care, Other (non HMO) | Admitting: Family Medicine

## 2018-02-04 ENCOUNTER — Other Ambulatory Visit: Payer: Managed Care, Other (non HMO)

## 2018-02-04 ENCOUNTER — Other Ambulatory Visit: Payer: Self-pay | Admitting: *Deleted

## 2018-02-04 VITALS — BP 141/89 | HR 88 | Wt 208.0 lb

## 2018-02-04 DIAGNOSIS — O0990 Supervision of high risk pregnancy, unspecified, unspecified trimester: Secondary | ICD-10-CM

## 2018-02-04 DIAGNOSIS — J0111 Acute recurrent frontal sinusitis: Secondary | ICD-10-CM

## 2018-02-04 DIAGNOSIS — O10919 Unspecified pre-existing hypertension complicating pregnancy, unspecified trimester: Secondary | ICD-10-CM

## 2018-02-04 MED ORDER — AZITHROMYCIN 250 MG PO TABS: 250.0000 mg | ORAL_TABLET | Freq: Every day | ORAL | 0 refills | Status: DC

## 2018-02-04 NOTE — Patient Instructions (Signed)

## 2018-02-04 NOTE — Progress Notes (Signed)
   PRENATAL VISIT NOTE  Subjective:  Regina Foley is a 32 y.o. G2P1001 at [redacted]w[redacted]d being seen today for ongoing prenatal care.  She is currently monitored for the following issues for this high-risk pregnancy and has Tachycardia- history of PSVT with ? attempted RFA at [facility] Nov 2013; Altered mental status- syncope while in [place]- intubated then extubated ; Conversion reaction- reported final diagnosis from [place] work up; Supervision of high risk pregnancy, antepartum; Chronic hypertension in pregnancy; History of cardiac arrest; History of supraventricular tachycardia; H/O LEEP; Anemia; Round ligament pain; and Oral herpes on their problem list.  Patient reports no complaints.  Contractions: Irritability. Vag. Bleeding: None.  Movement: Present. Denies leaking of fluid.   The following portions of the patient's history were reviewed and updated as appropriate: allergies, current medications, past family history, past medical history, past social history, past surgical history and problem list. Problem list updated.  Objective:   Vitals:   02/04/18 1110  BP: (!) 141/89  Pulse: 88  Weight: 208 lb (94.3 kg)    Fetal Status: Fetal Heart Rate (bpm): 152 Fundal Height: 31 cm Movement: Present     General:  Alert, oriented and cooperative. Patient is in no acute distress.  Skin: Skin is warm and dry. No rash noted.   Cardiovascular: Normal heart rate noted  Respiratory: Normal respiratory effort, no problems with respiration noted  Abdomen: Soft, gravid, appropriate for gestational age.  Pain/Pressure: Present     Pelvic: Cervical exam deferred        Extremities: Normal range of motion.  Edema: Trace  Mental Status:  Normal mood and affect. Normal behavior. Normal judgment and thought content.   Assessment and Plan:  Pregnancy: G2P1001 at [redacted]w[redacted]d  1. Chronic hypertension in pregnancy BP is in the 140/100 range--reports BP was 130/90's. Continue ASA, Labetalol and Aldomet Consider increasing  Labetalol to improve BP control. She prefers not to be induced at 39 wks--current recs reviewed, we can consider 40 wks if BP is controlled and no signs of superimposed pre-eclampsia.  2. Supervision of high risk pregnancy, antepartum Continue prenatal care.  3. Acute recurrent frontal sinusitis Complains of green drainage from his nose. - azithromycin (ZITHROMAX) 250 MG tablet; Take 1 tablet (250 mg total) by mouth daily. Take 2 tabs on day 1 and 1 tab daily  Dispense: 6 tablet; Refill: 0  Preterm labor symptoms and general obstetric precautions including but not limited to vaginal bleeding, contractions, leaking of fluid and fetal movement were reviewed in detail with the patient. Please refer to After Visit Summary for other counseling recommendations.  Return in 2 weeks (on 02/18/2018) for OB visit and BPP, needs MD.   Donnamae Jude, MD

## 2018-02-05 LAB — GLUCOSE TOLERANCE, 2 HOURS W/ 1HR
GLUCOSE, FASTING: 89 mg/dL (ref 65–91)
Glucose, 1 hour: 137 mg/dL (ref 65–179)
Glucose, 2 hour: 131 mg/dL (ref 65–152)

## 2018-02-11 ENCOUNTER — Telehealth: Payer: Self-pay

## 2018-02-11 ENCOUNTER — Telehealth: Payer: Self-pay | Admitting: *Deleted

## 2018-02-11 NOTE — Telephone Encounter (Signed)
Regina Foley left a voice message this am stating she was seen last Monday and given rx for sinus infection and has finished it.States it has gotten worse and wants to know what her options are.

## 2018-02-11 NOTE — Telephone Encounter (Signed)
Patient called the office stating that she was prescribed zithromax on 02/04/18 for a sinus infection. Pt states that she finished the antibiotics on 02/10/18. Pt states that she is still coughing and having nasal drainage. Pt states that she had a fever of 100.6 last night but hasn't had a fever since then. Pt states that she hasn't been exposed to the flu and wants to know what is safe to take for a cold. Pt advised to take Robitussin for cough and tylenol for fever if needed. Pt also advised to continue to drink plenty of fluids and to  go to an urgent care if she doesn't feel better. Pt verbalized understanding.

## 2018-02-15 NOTE — Telephone Encounter (Signed)
I called Regina Foley back and she states she called back that day and spoke to someone. I reviewed the note from 02/11/18 and patient states she take meds as advised and is doing better. She states she doesn't understand why it took [place] all week to call back. I apologized and informed her we try to call back within 48 hours and I am sorry we did not this time. She had no further needs expressed.

## 2018-02-20 ENCOUNTER — Ambulatory Visit (HOSPITAL_COMMUNITY)
Admission: RE | Admit: 2018-02-20 | Discharge: 2018-02-20 | Disposition: A | Discharge status: Home / Self Care | Payer: Managed Care, Other (non HMO) | Source: Ambulatory Visit | Attending: Family Medicine | Admitting: Family Medicine

## 2018-02-20 ENCOUNTER — Other Ambulatory Visit (HOSPITAL_COMMUNITY): Payer: Self-pay | Admitting: Obstetrics and Gynecology

## 2018-02-20 ENCOUNTER — Encounter (HOSPITAL_COMMUNITY): Payer: Self-pay

## 2018-02-20 ENCOUNTER — Ambulatory Visit (INDEPENDENT_AMBULATORY_CARE_PROVIDER_SITE_OTHER): Payer: Managed Care, Other (non HMO) | Admitting: Family Medicine

## 2018-02-20 ENCOUNTER — Ambulatory Visit (INDEPENDENT_AMBULATORY_CARE_PROVIDER_SITE_OTHER): Payer: Managed Care, Other (non HMO) | Admitting: *Deleted

## 2018-02-20 VITALS — BP 135/88 | HR 93 | Wt 208.9 lb

## 2018-02-20 DIAGNOSIS — O10919 Unspecified pre-existing hypertension complicating pregnancy, unspecified trimester: Secondary | ICD-10-CM

## 2018-02-20 DIAGNOSIS — O10913 Unspecified pre-existing hypertension complicating pregnancy, third trimester: Secondary | ICD-10-CM | POA: Insufficient documentation

## 2018-02-20 DIAGNOSIS — O0993 Supervision of high risk pregnancy, unspecified, third trimester: Secondary | ICD-10-CM

## 2018-02-20 DIAGNOSIS — B002 Herpesviral gingivostomatitis and pharyngotonsillitis: Secondary | ICD-10-CM

## 2018-02-20 DIAGNOSIS — Z9889 Other specified postprocedural states: Secondary | ICD-10-CM

## 2018-02-20 DIAGNOSIS — O99213 Obesity complicating pregnancy, third trimester: Secondary | ICD-10-CM | POA: Insufficient documentation

## 2018-02-20 DIAGNOSIS — O0990 Supervision of high risk pregnancy, unspecified, unspecified trimester: Secondary | ICD-10-CM

## 2018-02-20 DIAGNOSIS — Z3A32 32 weeks gestation of pregnancy: Secondary | ICD-10-CM | POA: Diagnosis not present

## 2018-02-20 DIAGNOSIS — Z8679 Personal history of other diseases of the circulatory system: Secondary | ICD-10-CM

## 2018-02-20 DIAGNOSIS — Z8674 Personal history of sudden cardiac arrest: Secondary | ICD-10-CM

## 2018-02-20 LAB — POCT URINALYSIS DIP (DEVICE)
Bilirubin Urine: NEGATIVE
Glucose, UA: NEGATIVE mg/dL
HGB URINE DIPSTICK: NEGATIVE
Ketones, ur: NEGATIVE mg/dL
Leukocytes, UA: NEGATIVE
NITRITE: NEGATIVE
PH: 6.5 (ref 5.0–8.0)
Protein, ur: NEGATIVE mg/dL
Specific Gravity, Urine: 1.02 (ref 1.005–1.030)
UROBILINOGEN UA: 0.2 mg/dL (ref 0.0–1.0)

## 2018-02-20 NOTE — Progress Notes (Signed)
MFM reported that pt's BP was elevated in their department - 151/98.  Pt denies H/A or visual disturbances.  [place] for growth and BPP done today

## 2018-02-20 NOTE — Patient Instructions (Signed)
Third Trimester of Pregnancy The third trimester is from week 28 through week 40 (months 7 through 9). The third trimester is a time when the unborn baby (fetus) is growing rapidly. At the end of the ninth month, the fetus is about 20 inches in length and weighs 6-10 pounds. Body changes during your third trimester Your body will continue to go through many changes during pregnancy. The changes vary from woman to woman. During the third trimester:  Your weight will continue to increase. You can expect to gain 25-35 pounds (11-16 kg) by the end of the pregnancy.  You may begin to get stretch marks on your hips, abdomen, and breasts.  You may urinate more often because the fetus is moving lower into your pelvis and pressing on your bladder.  You may develop or continue to have heartburn. This is caused by increased hormones that slow down muscles in the digestive tract.  You may develop or continue to have constipation because increased hormones slow digestion and cause the muscles that push waste through your intestines to relax.  You may develop hemorrhoids. These are swollen veins (varicose veins) in the rectum that can itch or be painful.  You may develop swollen, bulging veins (varicose veins) in your legs.  You may have increased body aches in the pelvis, back, or thighs. This is due to weight gain and increased hormones that are relaxing your joints.  You may have changes in your hair. These can include thickening of your hair, rapid growth, and changes in texture. Some women also have hair loss during or after pregnancy, or hair that feels dry or thin. Your hair will most likely return to normal after your baby is born.  Your breasts will continue to grow and they will continue to become tender. A yellow fluid (colostrum) may leak from your breasts. This is the first milk you are producing for your baby.  Your belly button may stick out.  You may notice more swelling in your hands,  face, or ankles.  You may have increased tingling or numbness in your hands, arms, and legs. The skin on your belly may also feel numb.  You may feel short of breath because of your expanding uterus.  You may have more problems sleeping. This can be caused by the size of your belly, increased need to urinate, and an increase in your body's metabolism.  You may notice the fetus "dropping," or moving lower in your abdomen (lightening).  You may have increased vaginal discharge.  You may notice your joints feel loose and you may have pain around your pelvic bone.  What to expect at prenatal visits You will have prenatal exams every 2 weeks until week 36. Then you will have weekly prenatal exams. During a routine prenatal visit:  You will be weighed to make sure you and the baby are growing normally.  Your blood pressure will be taken.  Your abdomen will be measured to track your baby's growth.  The fetal heartbeat will be listened to.  Any test results from the previous visit will be discussed.  You may have a cervical check near your due date to see if your cervix has softened or thinned (effaced).  You will be tested for Group B streptococcus. This happens between 35 and 37 weeks.  Your health care provider may ask you:  What your birth plan is.  How you are feeling.  If you are feeling the baby move.  If you have had   any abnormal symptoms, such as leaking fluid, bleeding, severe headaches, or abdominal cramping.  If you are using any tobacco products, including cigarettes, chewing tobacco, and electronic cigarettes.  If you have any questions.  Other tests or screenings that may be performed during your third trimester include:  Blood tests that check for low iron levels (anemia).  Fetal testing to check the health, activity level, and growth of the fetus. Testing is done if you have certain medical conditions or if there are problems during the  pregnancy.  Nonstress test (NST). This test checks the health of your baby to make sure there are no signs of problems, such as the baby not getting enough oxygen. During this test, a belt is placed around your belly. The baby is made to move, and its heart rate is monitored during movement.  What is false labor? False labor is a condition in which you feel small, irregular tightenings of the muscles in the womb (contractions) that usually go away with rest, changing position, or drinking water. These are called Braxton Hicks contractions. Contractions may last for hours, days, or even weeks before true labor sets in. If contractions come at regular intervals, become more frequent, increase in intensity, or become painful, you should see your health care provider. What are the signs of labor?  Abdominal cramps.  Regular contractions that start at 10 minutes apart and become stronger and more frequent with time.  Contractions that start on the top of the uterus and spread down to the lower abdomen and back.  Increased pelvic pressure and dull back pain.  A watery or bloody mucus discharge that comes from the vagina.  Leaking of amniotic fluid. This is also known as your "water breaking." It could be a slow trickle or a gush. Let your health care provider know if it has a color or strange odor. If you have any of these signs, call your health care provider right away, even if it is before your due date. Follow these instructions at home: Medicines  Follow your health care provider's instructions regarding medicine use. Specific medicines may be either safe or unsafe to take during pregnancy.  Take a prenatal vitamin that contains at least 600 micrograms (mcg) of folic acid.  If you develop constipation, try taking a stool softener if your health care provider approves. Eating and drinking  Eat a balanced diet that includes fresh fruits and vegetables, whole grains, good sources of protein  such as meat, eggs, or tofu, and low-fat dairy. Your health care provider will help you determine the amount of weight gain that is right for you.  Avoid raw meat and uncooked cheese. These carry germs that can cause birth defects in the baby.  If you have low calcium intake from food, talk to your health care provider about whether you should take a daily calcium supplement.  Eat four or five small meals rather than three large meals a day.  Limit foods that are high in fat and processed sugars, such as fried and sweet foods.  To prevent constipation: ? Drink enough fluid to keep your urine clear or pale yellow. ? Eat foods that are high in fiber, such as fresh fruits and vegetables, whole grains, and beans. Activity  Exercise only as directed by your health care provider. Most women can continue their usual exercise routine during pregnancy. Try to exercise for 30 minutes at least 5 days a week. Stop exercising if you experience uterine contractions.  Avoid heavy   lifting.  Do not exercise in extreme heat or humidity, or at high altitudes.  Wear low-heel, comfortable shoes.  Practice good posture.  You may continue to have sex unless your health care provider tells you otherwise. Relieving pain and discomfort  Take frequent breaks and rest with your legs elevated if you have leg cramps or low back pain.  Take warm sitz baths to soothe any pain or discomfort caused by hemorrhoids. Use hemorrhoid cream if your health care provider approves.  Wear a good support bra to prevent discomfort from breast tenderness.  If you develop varicose veins: ? Wear support pantyhose or compression stockings as told by your healthcare provider. ? Elevate your feet for 15 minutes, 3-4 times a day. Prenatal care  Write down your questions. Take them to your prenatal visits.  Keep all your prenatal visits as told by your health care provider. This is important. Safety  Wear your seat belt at  all times when driving.  Make a list of emergency phone numbers, including numbers for family, friends, the hospital, and police and fire departments. General instructions  Avoid cat litter boxes and soil used by cats. These carry germs that can cause birth defects in the baby. If you have a cat, ask someone to clean the litter box for you.  Do not travel far distances unless it is absolutely necessary and only with the approval of your health care provider.  Do not use hot tubs, steam rooms, or saunas.  Do not drink alcohol.  Do not use any products that contain nicotine or tobacco, such as cigarettes and e-cigarettes. If you need help quitting, ask your health care provider.  Do not use any medicinal herbs or unprescribed drugs. These chemicals affect the formation and growth of the baby.  Do not douche or use tampons or scented sanitary pads.  Do not cross your legs for long periods of time.  To prepare for the arrival of your baby: ? Take prenatal classes to understand, practice, and ask questions about labor and delivery. ? Make a trial run to the hospital. ? Visit the hospital and tour the maternity area. ? Arrange for maternity or paternity leave through employers. ? Arrange for family and friends to take care of pets while you are in the hospital. ? Purchase a rear-facing car seat and make sure you know how to install it in your car. ? Pack your hospital bag. ? Prepare the baby's nursery. Make sure to remove all pillows and stuffed animals from the baby's crib to prevent suffocation.  Visit your dentist if you have not gone during your pregnancy. Use a soft toothbrush to brush your teeth and be gentle when you floss. Contact a health care provider if:  You are unsure if you are in labor or if your water has broken.  You become dizzy.  You have mild pelvic cramps, pelvic pressure, or nagging pain in your abdominal area.  You have lower back pain.  You have persistent  nausea, vomiting, or diarrhea.  You have an unusual or bad smelling vaginal discharge.  You have pain when you urinate. Get help right away if:  Your water breaks before 37 weeks.  You have regular contractions less than 5 minutes apart before 37 weeks.  You have a fever.  You are leaking fluid from your vagina.  You have spotting or bleeding from your vagina.  You have severe abdominal pain or cramping.  You have rapid weight loss or weight gain.    You have shortness of breath with chest pain.  You notice sudden or extreme swelling of your face, hands, ankles, feet, or legs.  Your baby makes fewer than 10 movements in 2 hours.  You have severe headaches that do not go away when you take medicine.  You have vision changes. Summary  The third trimester is from week 28 through week 40, months 7 through 9. The third trimester is a time when the unborn baby (fetus) is growing rapidly.  During the third trimester, your discomfort may increase as you and your baby continue to gain weight. You may have abdominal, leg, and back pain, sleeping problems, and an increased need to urinate.  During the third trimester your breasts will keep growing and they will continue to become tender. A yellow fluid (colostrum) may leak from your breasts. This is the first milk you are producing for your baby.  False labor is a condition in which you feel small, irregular tightenings of the muscles in the womb (contractions) that eventually go away. These are called Braxton Hicks contractions. Contractions may last for hours, days, or even weeks before true labor sets in.  Signs of labor can include: abdominal cramps; regular contractions that start at 10 minutes apart and become stronger and more frequent with time; watery or bloody mucus discharge that comes from the vagina; increased pelvic pressure and dull back pain; and leaking of amniotic fluid. This information is not intended to replace advice  given to you by your health care provider. Make sure you discuss any questions you have with your health care provider. Document Released: 11/21/2001 Document Revised: 05/04/2016 Document Reviewed: 01/28/2013 Elsevier Interactive Patient Education  2017 [employer]  

## 2018-02-20 NOTE — Progress Notes (Signed)
     PRENATAL VISIT NOTE  Subjective:  Regina Foley is a 32 y.o. G2P1001 at [redacted]w[redacted]d being seen today for ongoing prenatal care.  She is currently monitored for the following issues for this high-risk pregnancy and has Tachycardia- history of PSVT with ? attempted RFA at [facility] Nov 2013; Altered mental status- syncope while in [place]- intubated then extubated ; Conversion reaction- reported final diagnosis from [place] work up; Supervision of high risk pregnancy, antepartum; Chronic hypertension in pregnancy; History of cardiac arrest; History of supraventricular tachycardia; H/O LEEP; Anemia; Round ligament pain; and Oral herpes on their problem list.  Patient reports no complaints.  Contractions: Irregular. Vag. Bleeding: None.  Movement: Present. Denies leaking of fluid.   The following portions of the patient's history were reviewed and updated as appropriate: allergies, current medications, past family history, past medical history, past social history, past surgical history and problem list. Problem list updated.  Objective:   Vitals:   02/20/18 1458  BP: 135/88  Pulse: 93  Weight: 208 lb 14.4 oz (94.8 kg)    Fetal Status: Fetal Heart Rate (bpm): NST   Movement: Present     General:  Alert, oriented and cooperative. Patient is in no acute distress.  Skin: Skin is warm and dry. No rash noted.   Cardiovascular: Normal heart rate noted  Respiratory: Normal respiratory effort, no problems with respiration noted  Abdomen: Soft, gravid, appropriate for gestational age.  Pain/Pressure: Present     Pelvic: Cervical exam deferred        Extremities: Normal range of motion.  Edema: None  Mental Status:  Normal mood and affect. Normal behavior. Normal judgment and thought content.  NST: Baseline: 125 bpm, Variability: Good {> 6 bpm), Accelerations: Reactive and Decelerations: Absent  U/s 02/20/18-vtx, Nml AFI, EFW 5 lb 12 oz 88% BPP 8/8 Assessment and Plan:  Pregnancy: G2P1001 at [redacted]w[redacted]d  1.  Supervision of high risk pregnancy, antepartum Baby on the large side  2. Chronic hypertension in pregnancy Continue Labetalol, Aldomet, ASA Declines IOL at 39 wks  - [place] MFM FETAL BPP WO NON STRESS; Future  Preterm labor symptoms and general obstetric precautions including but not limited to vaginal bleeding, contractions, leaking of fluid and fetal movement were reviewed in detail with the patient. Please refer to After Visit Summary for other counseling recommendations.  Return in about 1 week (around 02/27/2018) for NST/BPP and HOB.   Donnamae Jude, MD

## 2018-02-22 ENCOUNTER — Ambulatory Visit (HOSPITAL_COMMUNITY): Payer: Self-pay

## 2018-02-26 ENCOUNTER — Encounter: Payer: Self-pay | Admitting: Family Medicine

## 2018-02-26 DIAGNOSIS — B370 Candidal stomatitis: Secondary | ICD-10-CM

## 2018-02-28 MED ORDER — FLUCONAZOLE 150 MG PO TABS: 150.0000 mg | ORAL_TABLET | Freq: Every day | ORAL | 2 refills | Status: DC

## 2018-03-01 ENCOUNTER — Ambulatory Visit: Payer: Managed Care, Other (non HMO) | Admitting: *Deleted

## 2018-03-01 ENCOUNTER — Ambulatory Visit: Payer: Self-pay

## 2018-03-01 VITALS — BP 138/86 | HR 102

## 2018-03-01 DIAGNOSIS — O10919 Unspecified pre-existing hypertension complicating pregnancy, unspecified trimester: Secondary | ICD-10-CM

## 2018-03-01 NOTE — Progress Notes (Signed)
NST performed today was reviewed and was found to be reactive. Subsequent BPP performed today was also reviewed and was found to be 10/10. AFI was also normal. Continue recommended antenatal testing and prenatal care.  Verita Schneiders, MD, [place] for [employer], [place]

## 2018-03-01 NOTE — Progress Notes (Signed)

## 2018-03-06 ENCOUNTER — Encounter: Payer: Self-pay | Admitting: Family Medicine

## 2018-03-07 ENCOUNTER — Encounter: Payer: Self-pay | Admitting: *Deleted

## 2018-03-08 ENCOUNTER — Ambulatory Visit (INDEPENDENT_AMBULATORY_CARE_PROVIDER_SITE_OTHER): Payer: Managed Care, Other (non HMO) | Admitting: Obstetrics and Gynecology

## 2018-03-08 ENCOUNTER — Inpatient Hospital Stay (HOSPITAL_COMMUNITY)
Admission: AD | Admit: 2018-03-08 | Discharge: 2018-03-10 | DRG: 833 | Disposition: A | Discharge status: Home / Self Care | Payer: Managed Care, Other (non HMO) | Source: Ambulatory Visit | Attending: Obstetrics and Gynecology | Admitting: Obstetrics and Gynecology

## 2018-03-08 ENCOUNTER — Encounter: Payer: Self-pay | Admitting: Obstetrics and Gynecology

## 2018-03-08 ENCOUNTER — Other Ambulatory Visit: Payer: Self-pay

## 2018-03-08 ENCOUNTER — Encounter (HOSPITAL_COMMUNITY): Payer: Self-pay

## 2018-03-08 ENCOUNTER — Ambulatory Visit (INDEPENDENT_AMBULATORY_CARE_PROVIDER_SITE_OTHER): Payer: Managed Care, Other (non HMO) | Admitting: *Deleted

## 2018-03-08 ENCOUNTER — Ambulatory Visit: Payer: Self-pay

## 2018-03-08 VITALS — BP 162/115 | HR 99 | Wt 215.9 lb

## 2018-03-08 DIAGNOSIS — O10913 Unspecified pre-existing hypertension complicating pregnancy, third trimester: Secondary | ICD-10-CM

## 2018-03-08 DIAGNOSIS — Z7982 Long term (current) use of aspirin: Secondary | ICD-10-CM

## 2018-03-08 DIAGNOSIS — O10919 Unspecified pre-existing hypertension complicating pregnancy, unspecified trimester: Secondary | ICD-10-CM

## 2018-03-08 DIAGNOSIS — Z3A35 35 weeks gestation of pregnancy: Secondary | ICD-10-CM | POA: Diagnosis not present

## 2018-03-08 DIAGNOSIS — O0990 Supervision of high risk pregnancy, unspecified, unspecified trimester: Secondary | ICD-10-CM

## 2018-03-08 DIAGNOSIS — R030 Elevated blood-pressure reading, without diagnosis of hypertension: Secondary | ICD-10-CM | POA: Diagnosis present

## 2018-03-08 DIAGNOSIS — Z8674 Personal history of sudden cardiac arrest: Secondary | ICD-10-CM

## 2018-03-08 DIAGNOSIS — D649 Anemia, unspecified: Secondary | ICD-10-CM | POA: Diagnosis present

## 2018-03-08 DIAGNOSIS — O10013 Pre-existing essential hypertension complicating pregnancy, third trimester: Principal | ICD-10-CM | POA: Diagnosis present

## 2018-03-08 DIAGNOSIS — R8271 Bacteriuria: Secondary | ICD-10-CM

## 2018-03-08 DIAGNOSIS — O99013 Anemia complicating pregnancy, third trimester: Secondary | ICD-10-CM | POA: Diagnosis present

## 2018-03-08 LAB — COMPREHENSIVE METABOLIC PANEL
ALT: 10 U/L — ABNORMAL LOW (ref 14–54)
ANION GAP: 10 (ref 5–15)
AST: 15 U/L (ref 15–41)
Albumin: 3 g/dL — ABNORMAL LOW (ref 3.5–5.0)
Alkaline Phosphatase: 107 U/L (ref 38–126)
BILIRUBIN TOTAL: 0.4 mg/dL (ref 0.3–1.2)
BUN: 7 mg/dL (ref 6–20)
CHLORIDE: 105 mmol/L (ref 101–111)
CO2: 21 mmol/L — ABNORMAL LOW (ref 22–32)
Calcium: 9.2 mg/dL (ref 8.9–10.3)
Creatinine, Ser: 0.53 mg/dL (ref 0.44–1.00)
GFR calc Af Amer: >60 mL/min (ref >60)
GFR calc non Af Amer: >60 mL/min (ref >60)
Glucose, Bld: 80 mg/dL (ref 65–99)
POTASSIUM: 4.3 mmol/L (ref 3.5–5.1)
Sodium: 136 mmol/L (ref 135–145)
TOTAL PROTEIN: 7.2 g/dL (ref 6.5–8.1)

## 2018-03-08 LAB — CBC
HEMATOCRIT: 34.9 % — AB (ref 36.0–46.0)
Hemoglobin: 11.9 g/dL — ABNORMAL LOW (ref 12.0–15.0)
MCH: 29.5 pg (ref 26.0–34.0)
MCHC: 34.1 g/dL (ref 30.0–36.0)
MCV: 86.6 fL (ref 78.0–100.0)
PLATELETS: 224 10*3/uL (ref 150–400)
RBC: 4.03 MIL/uL (ref 3.87–5.11)
RDW: 13 % (ref 11.5–15.5)
WBC: 11.8 10*3/uL — ABNORMAL HIGH (ref 4.0–10.5)

## 2018-03-08 LAB — ABO/RH: ABO/RH(D): A POS

## 2018-03-08 LAB — POCT URINALYSIS DIP (DEVICE)
BILIRUBIN URINE: NEGATIVE
Glucose, UA: NEGATIVE mg/dL
HGB URINE DIPSTICK: NEGATIVE
Ketones, ur: NEGATIVE mg/dL
NITRITE: NEGATIVE
PH: 7 (ref 5.0–8.0)
PROTEIN: NEGATIVE mg/dL
Specific Gravity, Urine: 1.015 (ref 1.005–1.030)
UROBILINOGEN UA: 0.2 mg/dL (ref 0.0–1.0)

## 2018-03-08 LAB — PROTEIN / CREATININE RATIO, URINE
CREATININE, URINE: 63 mg/dL
PROTEIN CREATININE RATIO: 0.13 mg/mg{creat} (ref 0.00–0.15)
Total Protein, Urine: 8 mg/dL

## 2018-03-08 LAB — TYPE AND SCREEN
ABO/RH(D): A POS
Antibody Screen: NEGATIVE

## 2018-03-08 MED ORDER — BETAMETHASONE SOD PHOS & ACET 6 (3-3) MG/ML IJ SUSP
12.0000 mg | INTRAMUSCULAR | Status: AC
  Administered 2018-03-08 – 2018-03-09 (×2): 12 mg via INTRAMUSCULAR
  Filled 2018-03-08 (×2): qty 2

## 2018-03-08 MED ORDER — CALCIUM CARBONATE ANTACID 500 MG PO CHEW: 2.0000 | CHEWABLE_TABLET | ORAL | Status: DC | PRN

## 2018-03-08 MED ORDER — METHYLDOPA 500 MG PO TABS
250.0000 mg | ORAL_TABLET | Freq: Three times a day (TID) | ORAL | Status: DC
  Administered 2018-03-08 – 2018-03-10 (×6): 250 mg via ORAL
  Filled 2018-03-08 (×2): qty 1
  Filled 2018-03-08: qty 0.5
  Filled 2018-03-08: qty 1
  Filled 2018-03-08 (×2): qty 0.5
  Filled 2018-03-08: qty 1

## 2018-03-08 MED ORDER — PRENATAL MULTIVITAMIN CH: 1.0000 | ORAL_TABLET | Freq: Every day | ORAL | Status: DC

## 2018-03-08 MED ORDER — MAGNESIUM SULFATE 40 G IN LACTATED RINGERS - SIMPLE
2.0000 g/h | INTRAVENOUS | Status: DC
  Administered 2018-03-08: 2 g/h via INTRAVENOUS
  Filled 2018-03-08: qty 40

## 2018-03-08 MED ORDER — LABETALOL HCL 200 MG PO TABS: 200.0000 mg | ORAL_TABLET | Freq: Three times a day (TID) | ORAL | Status: DC

## 2018-03-08 MED ORDER — NIFEDIPINE 10 MG PO CAPS: 10.0000 mg | ORAL_CAPSULE | ORAL | Status: DC | PRN

## 2018-03-08 MED ORDER — ACETAMINOPHEN 325 MG PO TABS
650.0000 mg | ORAL_TABLET | ORAL | Status: DC | PRN
  Administered 2018-03-08 – 2018-03-09 (×2): 650 mg via ORAL
  Filled 2018-03-08 (×2): qty 2

## 2018-03-08 MED ORDER — LABETALOL HCL 5 MG/ML IV SOLN
20.0000 mg | INTRAVENOUS | Status: DC | PRN
  Administered 2018-03-08: 20 mg via INTRAVENOUS
  Filled 2018-03-08 (×2): qty 4
  Filled 2018-03-08: qty 16

## 2018-03-08 MED ORDER — LACTATED RINGERS IV SOLN
INTRAVENOUS | Status: DC
  Administered 2018-03-08 (×2): via INTRAVENOUS

## 2018-03-08 MED ORDER — LABETALOL HCL 200 MG PO TABS
200.0000 mg | ORAL_TABLET | Freq: Three times a day (TID) | ORAL | Status: DC
  Administered 2018-03-08: 200 mg via ORAL

## 2018-03-08 MED ORDER — ASPIRIN EC 81 MG PO TBEC
81.0000 mg | DELAYED_RELEASE_TABLET | Freq: Every day | ORAL | Status: DC
  Administered 2018-03-08 – 2018-03-10 (×3): 81 mg via ORAL
  Filled 2018-03-08 (×3): qty 1

## 2018-03-08 MED ORDER — PROMETHAZINE HCL 25 MG/ML IJ SOLN
25.0000 mg | INTRAMUSCULAR | Status: DC | PRN
Start: 2018-03-08 — End: 2018-03-10
  Administered 2018-03-08: 25 mg via INTRAVENOUS
  Filled 2018-03-08: qty 1

## 2018-03-08 MED ORDER — HYDRALAZINE HCL 20 MG/ML IJ SOLN
10.0000 mg | Freq: Once | INTRAMUSCULAR | Status: AC | PRN
  Administered 2018-03-08: 10 mg via INTRAVENOUS
  Filled 2018-03-08: qty 1

## 2018-03-08 MED ORDER — DOCUSATE SODIUM 100 MG PO CAPS
100.0000 mg | ORAL_CAPSULE | Freq: Every day | ORAL | Status: DC
  Administered 2018-03-08 – 2018-03-10 (×3): 100 mg via ORAL
  Filled 2018-03-08 (×3): qty 1

## 2018-03-08 MED ORDER — HYDRALAZINE HCL 20 MG/ML IJ SOLN
INTRAMUSCULAR | Status: AC
  Administered 2018-03-08: 10 mg
  Filled 2018-03-08: qty 1

## 2018-03-08 MED ORDER — LABETALOL HCL 5 MG/ML IV SOLN
20.0000 mg | INTRAVENOUS | Status: AC | PRN
  Administered 2018-03-08: 40 mg via INTRAVENOUS
  Administered 2018-03-08: 80 mg via INTRAVENOUS
  Administered 2018-03-08: 20 mg via INTRAVENOUS
  Filled 2018-03-08: qty 8

## 2018-03-08 MED ORDER — HYDRALAZINE HCL 20 MG/ML IJ SOLN
10.0000 mg | Freq: Once | INTRAMUSCULAR | Status: AC | PRN
  Administered 2018-03-08: 10 mg via INTRAVENOUS

## 2018-03-08 MED ORDER — LABETALOL HCL 200 MG PO TABS
400.0000 mg | ORAL_TABLET | Freq: Three times a day (TID) | ORAL | Status: DC
  Administered 2018-03-08: 400 mg via ORAL
  Filled 2018-03-08 (×2): qty 2

## 2018-03-08 MED ORDER — ZOLPIDEM TARTRATE 5 MG PO TABS: 5.0000 mg | ORAL_TABLET | Freq: Every evening | ORAL | Status: DC | PRN

## 2018-03-08 MED ORDER — MAGNESIUM SULFATE BOLUS VIA INFUSION
4.0000 g | Freq: Once | INTRAVENOUS | Status: AC
  Administered 2018-03-08: 4 g via INTRAVENOUS
  Filled 2018-03-08: qty 500

## 2018-03-08 NOTE — Progress Notes (Signed)

## 2018-03-08 NOTE — H&P (Signed)
Jorje Guild, NP  Nurse Practitioner  Obstetrics  MAU Provider Note  Cosign Needed Addendum  Date of Service:  03/08/2018 12:01 PM                [] Hide copied text  [] Hover for details    Chief Complaint: Hypertension  SUBJECTIVE HPI: Regina Foley is a 32 y.o. G2P1001 with a history of CHTN who was sent over by the clinic for elevated blood pressure readings. She was at the [facility] clinic earlier this morning for a routine prenatal visit and was found to have elevated blood pressures. Blood pressures in office today were 156/105 and repeat was 162/115. States that her blood pressure normally runs in the 130/80's. She takes labetalol and BASA daily. States her last dose of labetalol was at 630 this morning; she has not taken her BASA today yet. Denies headaches, vision changes, n/v, chest pain, increeased SOB, epigastric pain, and worsening of her preexisting palpations. Her chronic palpitations are followed by cardiology, which she last saw in January. Reports fetal movements today. She denies vaginal bleeding, vaginal leaking, or contractions.        Past Medical History:  Diagnosis Date  . Cardiac arrest ([place])   . Cervical cancer ([place])   . Hypertension         Past Surgical History:  Procedure Laterality Date  . APPENDECTOMY    . CERVICAL CONE BIOPSY    . CHOLECYSTECTOMY    . LEEP    . Ovarian cyst     drained   Social History        Socioeconomic History  . Marital status: Divorced    Spouse name: Not on file  . Number of children: Not on file  . Years of education: Not on file  . Highest education level: Not on file  Occupational History  . Not on file  Social Needs  . Financial resource strain: Not on file  . Food insecurity:    Worry: Not on file    Inability: Not on file  . Transportation needs:    Medical: Not on file    Non-medical: Not on file  Tobacco Use  . Smoking status: Never Smoker  . Smokeless tobacco: Never  Used  Substance and Sexual Activity  . Alcohol use: No    Frequency: Never  . Drug use: No  . Sexual activity: Yes    Birth control/protection: None  Lifestyle  . Physical activity:    Days per week: Not on file    Minutes per session: Not on file  . Stress: Not on file  Relationships  . Social connections:    Talks on phone: Not on file    Gets together: Not on file    Attends religious service: Not on file    Active member of club or organization: Not on file    Attends meetings of clubs or organizations: Not on file    Relationship status: Not on file  . Intimate partner violence:    Fear of current or ex partner: Not on file    Emotionally abused: Not on file    Physically abused: Not on file    Forced sexual activity: Not on file  Other Topics Concern  . Not on file  Social History Narrative  . Not on file   No current facility-administered medications on file prior to encounter.          Current Outpatient Medications on File Prior to Encounter  Medication Sig  Dispense Refill  . acetaminophen (TYLENOL) 500 MG tablet Take by mouth.    Marland Kitchen aspirin EC 81 MG tablet Take 1 tablet (81 mg total) by mouth daily. Take after 12 weeks for prevention of preeclampsia later in pregnancy 300 tablet 2  . labetalol (NORMODYNE) 200 MG tablet 200 mg 3 (three) times daily.     . methyldopa (ALDOMET) 250 MG tablet Take 250 mg by mouth 3 (three) times daily.     . promethazine (PHENERGAN) 6.25 MG/5ML syrup Take 5 mLs (6.25 mg total) by mouth every 6 (six) hours as needed for nausea or vomiting. 120 mL 0  . valACYclovir (VALTREX) 1000 MG tablet Take 1 tablet (1,000 mg total) by mouth daily. Take for 5 days (Patient not taking: Reported on 02/20/2018) 5 tablet 2        Allergies  Allergen Reactions  . Diclegis [Doxylamine-Pyridoxine] Anaphylaxis and Swelling  . Hydrocodone Nausea And Vomiting  . Compazine [Prochlorperazine Edisylate]     unknown  .  Nifedipine Other (See Comments)    Severe h/a  . Reglan [Metoclopramide]     unknown    ROS:  Review of Systems  HENT: Negative for facial swelling.   Eyes: Negative for visual disturbance.  Respiratory: Negative for shortness of breath.   Cardiovascular: Positive for palpitations. Negative for chest pain.  Gastrointestinal: Negative for abdominal pain, nausea and vomiting.  Genitourinary: Negative for vaginal bleeding and vaginal discharge.  Neurological: Negative for headaches.    Physical Exam   Patient Vitals for the past 24 hrs:  BP Temp Temp src Pulse Resp SpO2 Height Weight  03/08/18 1300 (!) 154/106 - - 100 - - - -  03/08/18 1244 (!) 171/107 - - 95 - - - -  03/08/18 1219 (!) 161/118 - - (!) 108 - - - -  03/08/18 1201 (!) 158/102 - - 100 - - - -  03/08/18 1148 (!) 168/100 - - 100 - - - -  03/08/18 1132 - - - - - 97 % 5\' 6"  (1.676 m) 216 lb (98 kg)  03/08/18 1131 (!) 154/99 98.2 F (36.8 C) Oral 92 16 - - -   Physical Exam  Nursing note and vitals reviewed. Constitutional: She is oriented to person, place, and time. She appears well-developed and well-nourished. No distress.  HENT:  Head: Normocephalic and atraumatic.  Eyes: Conjunctivae are normal. Right eye exhibits no discharge. Left eye exhibits no discharge. No scleral icterus.  Neck: Normal range of motion.  Cardiovascular: Normal rate, regular rhythm and normal heart sounds. Exam reveals no gallop and no friction rub.  No murmur heard. Respiratory: Effort normal and breath sounds normal. No respiratory distress. She has no wheezes. She has no rales.  GI: Soft.  Neurological: She is alert and oriented to person, place, and time. She has normal reflexes.  No clonus  Skin: Skin is warm and dry. She is not diaphoretic.  Psychiatric: She has a normal mood and affect. Her behavior is normal. Judgment and thought content normal.     ASSESSMENT -Supervision of high risk pregnancy  -CHTN -hx of  SVT -Anemia - possible pre-e with severe features  PLAN -Admit for further monitoring and blood pressure control -Order CBC, urinanalysis, serum creatinine, LFTs, protein:creatinine ratio to evaluate possibility of pre-e with severe features -Patient may return to MAU as needed or if her condition were to change or worsen   Brenton Grills, Student-PA 03/08/2018 12:05 PM     NST:  Baseline: 135 bpm,  Variability: Good {> 6 bpm), Accelerations: Reactive and Decelerations: Absent  I confirm that I have verified the information documented in the PA student's note and that I have also personally reperformed the physical exam and all medical decision making activities.  Patient is a red chart patient d/t hx of SVT & cardiac arrest. She has CHTN. She is currently taking labetalol & aldomet. Took both meds this morning. She denies h/a, visual disturbance, or epigastric pain.   Was given IV labetalol per antihypertensive protocol d/t her severe range BPs.  PreE labs wnl C/w Dr. Roselie Awkward. Will keep in house over night while adjustments made to oral antihypertensives & to evaluate for development of siPreE.   A:  1. Maternal chronic hypertension in third trimester    P: Place in observation bed Monitor for worsening CHTN vs superimposed preeclampsia Adjust oral meds & monitor BP control  Jorje Guild, NP         Attestation of Attending Supervision of Advanced Practitioner (CNM/NP/PA): Evaluation and management procedures were performed by the Advanced Practitioner under my supervision and collaboration. I have reviewed the Advanced Practitioner's note and chart, and I agree with the management and plan.  Emeterio Reeve MD

## 2018-03-08 NOTE — Plan of Care (Signed)
  Problem: Education: Goal: Knowledge of disease or condition will improve Outcome: Progressing   Problem: Pain Management: Goal: Relief or control of pain will improve Outcome: Progressing   Problem: Education: Goal: Knowledge of disease or condition will improve Outcome: Progressing   Problem: Education: Goal: Knowledge of the prescribed therapeutic regimen will improve Outcome: Progressing   Problem: Fluid Volume: Goal: Peripheral tissue perfusion will improve Outcome: Progressing   Problem: Education: Goal: Knowledge of General Education information will improve Outcome: Completed/Met

## 2018-03-08 NOTE — MAU Note (Signed)
Urine in the lab  

## 2018-03-08 NOTE — Progress Notes (Signed)
   03/08/18 1148  Vital Signs  BP (!) 168/100  Report given to E Lawerence, NP re: above BP, pt not symptomatic, assessment nl, & that pt has taken am doses of labetalol & aldomet.  Awaiting orders.

## 2018-03-08 NOTE — Progress Notes (Signed)
Pt requests refill of phenergan syrup. Pt denies H/A or visual disturbances.

## 2018-03-08 NOTE — Progress Notes (Signed)
   03/08/18 1201  Vital Signs  BP (!) 158/102  Reported BP to NP.  Orders rec'd to hold meds until after next BP & give if within parameters.

## 2018-03-08 NOTE — Progress Notes (Signed)
Report given to NP that 1st dose of labetalol to be given now.

## 2018-03-08 NOTE — MAU Provider Note (Addendum)
Chief Complaint: Hypertension  SUBJECTIVE HPI: Regina Foley is a 32 y.o. G2P1001 with a history of CHTN who was sent over by the clinic for elevated blood pressure readings. She was at the [facility] clinic earlier this morning for a routine prenatal visit and was found to have elevated blood pressures. Blood pressures in office today were 156/105 and repeat was 162/115. States that her blood pressure normally runs in the 130/80's. She takes labetalol and BASA daily. States her last dose of labetalol was at 630 this morning; she has not taken her BASA today yet. Denies headaches, vision changes, n/v, chest pain, increeased SOB, epigastric pain, and worsening of her preexisting palpations. Her chronic palpitations are followed by cardiology, which she last saw in January. Reports fetal movements today. She denies vaginal bleeding, vaginal leaking, or contractions.    Past Medical History:  Diagnosis Date  . Cardiac arrest ([place])   . Cervical cancer ([place])   . Hypertension    Past Surgical History:  Procedure Laterality Date  . APPENDECTOMY    . CERVICAL CONE BIOPSY    . CHOLECYSTECTOMY    . LEEP    . Ovarian cyst     drained   Social History   Socioeconomic History  . Marital status: Divorced    Spouse name: Not on file  . Number of children: Not on file  . Years of education: Not on file  . Highest education level: Not on file  Occupational History  . Not on file  Social Needs  . Financial resource strain: Not on file  . Food insecurity:    Worry: Not on file    Inability: Not on file  . Transportation needs:    Medical: Not on file    Non-medical: Not on file  Tobacco Use  . Smoking status: Never Smoker  . Smokeless tobacco: Never Used  Substance and Sexual Activity  . Alcohol use: No    Frequency: Never  . Drug use: No  . Sexual activity: Yes    Birth control/protection: None  Lifestyle  . Physical activity:    Days per week: Not on file    Minutes per session: Not on file   . Stress: Not on file  Relationships  . Social connections:    Talks on phone: Not on file    Gets together: Not on file    Attends religious service: Not on file    Active member of club or organization: Not on file    Attends meetings of clubs or organizations: Not on file    Relationship status: Not on file  . Intimate partner violence:    Fear of current or ex partner: Not on file    Emotionally abused: Not on file    Physically abused: Not on file    Forced sexual activity: Not on file  Other Topics Concern  . Not on file  Social History Narrative  . Not on file   No current facility-administered medications on file prior to encounter.    Current Outpatient Medications on File Prior to Encounter  Medication Sig Dispense Refill  . acetaminophen (TYLENOL) 500 MG tablet Take by mouth.    Marland Kitchen aspirin EC 81 MG tablet Take 1 tablet (81 mg total) by mouth daily. Take after 12 weeks for prevention of preeclampsia later in pregnancy 300 tablet 2  . labetalol (NORMODYNE) 200 MG tablet 200 mg 3 (three) times daily.     . methyldopa (ALDOMET) 250 MG tablet Take 250  mg by mouth 3 (three) times daily.     . promethazine (PHENERGAN) 6.25 MG/5ML syrup Take 5 mLs (6.25 mg total) by mouth every 6 (six) hours as needed for nausea or vomiting. 120 mL 0  . valACYclovir (VALTREX) 1000 MG tablet Take 1 tablet (1,000 mg total) by mouth daily. Take for 5 days (Patient not taking: Reported on 02/20/2018) 5 tablet 2   Allergies  Allergen Reactions  . Diclegis [Doxylamine-Pyridoxine] Anaphylaxis and Swelling  . Hydrocodone Nausea And Vomiting  . Compazine [Prochlorperazine Edisylate]     unknown  . Nifedipine Other (See Comments)    Severe h/a  . Reglan [Metoclopramide]     unknown    ROS:  Review of Systems  HENT: Negative for facial swelling.   Eyes: Negative for visual disturbance.  Respiratory: Negative for shortness of breath.   Cardiovascular: Positive for palpitations. Negative for  chest pain.  Gastrointestinal: Negative for abdominal pain, nausea and vomiting.  Genitourinary: Negative for vaginal bleeding and vaginal discharge.  Neurological: Negative for headaches.    Physical Exam   Patient Vitals for the past 24 hrs:  BP Temp Temp src Pulse Resp SpO2 Height Weight  03/08/18 1300 (!) 154/106 - - 100 - - - -  03/08/18 1244 (!) 171/107 - - 95 - - - -  03/08/18 1219 (!) 161/118 - - (!) 108 - - - -  03/08/18 1201 (!) 158/102 - - 100 - - - -  03/08/18 1148 (!) 168/100 - - 100 - - - -  03/08/18 1132 - - - - - 97 % 5\' 6"  (1.676 m) 216 lb (98 kg)  03/08/18 1131 (!) 154/99 98.2 F (36.8 C) Oral 92 16 - - -   Physical Exam  Nursing note and vitals reviewed. Constitutional: She is oriented to person, place, and time. She appears well-developed and well-nourished. No distress.  HENT:  Head: Normocephalic and atraumatic.  Eyes: Conjunctivae are normal. Right eye exhibits no discharge. Left eye exhibits no discharge. No scleral icterus.  Neck: Normal range of motion.  Cardiovascular: Normal rate, regular rhythm and normal heart sounds. Exam reveals no gallop and no friction rub.  No murmur heard. Respiratory: Effort normal and breath sounds normal. No respiratory distress. She has no wheezes. She has no rales.  GI: Soft.  Neurological: She is alert and oriented to person, place, and time. She has normal reflexes.  No clonus  Skin: Skin is warm and dry. She is not diaphoretic.  Psychiatric: She has a normal mood and affect. Her behavior is normal. Judgment and thought content normal.     ASSESSMENT -Supervision of high risk pregnancy  -CHTN -hx of SVT -Anemia - possible pre-e with severe features  PLAN -Admit for further monitoring and blood pressure control -Order CBC, urinanalysis, serum creatinine, LFTs, protein:creatinine ratio to evaluate possibility of pre-e with severe features -Patient may return to MAU as needed or if her condition were to change or  worsen   Brenton Grills, Student-PA 03/08/2018 12:05 PM     NST:  Baseline: 135 bpm, Variability: Good {> 6 bpm), Accelerations: Reactive and Decelerations: Absent  I confirm that I have verified the information documented in the PA student's note and that I have also personally reperformed the physical exam and all medical decision making activities.  Patient is a red chart patient d/t hx of SVT & cardiac arrest. She has CHTN. She is currently taking labetalol & aldomet. Took both meds this morning. She denies h/a, visual  disturbance, or epigastric pain.   Was given IV labetalol per antihypertensive protocol d/t her severe range BPs.  PreE labs wnl C/w Dr. Roselie Awkward. Will keep in house over night while adjustments made to oral antihypertensives & to evaluate for development of siPreE.   A:  1. Maternal chronic hypertension in third trimester    P: Place in observation bed Monitor for worsening CHTN vs superimposed preeclampsia Adjust oral meds & monitor BP control  Jorje Guild, NP

## 2018-03-08 NOTE — Progress Notes (Signed)
Subjective:  Regina Foley is a 32 y.o. G2P1001 at [redacted]w[redacted]d being seen today for ongoing prenatal care.  She is currently monitored for the following issues for this high-risk pregnancy and has Tachycardia- history of PSVT with ? attempted RFA at [facility] 2013; Altered mental status- syncope while in [place]- intubated then extubated ; Conversion reaction- reported final diagnosis from [place] work up; Supervision of high risk pregnancy, antepartum; Chronic hypertension in pregnancy; History of cardiac arrest; History of supraventricular tachycardia; H/O LEEP; Anemia; Round ligament pain; Oral herpes; and GBS bacteriuria on their problem list.  Patient reports no complaints.  Contractions: Irregular. Vag. Bleeding: None.  Movement: Present. Denies leaking of fluid.   The following portions of the patient's history were reviewed and updated as appropriate: allergies, current medications, past family history, past medical history, past social history, past surgical history and problem list. Problem list updated.  Objective:   Vitals:   03/08/18 0914 03/08/18 0953  BP: (!) 155/105 (!) 162/115  Pulse: 99   Weight: 215 lb 14.4 oz (97.9 kg)     Fetal Status: Fetal Heart Rate (bpm): NST   Movement: Present     General:  Alert, oriented and cooperative. Patient is in no acute distress.  Skin: Skin is warm and dry. No rash noted.   Cardiovascular: Normal heart rate noted  Respiratory: Normal respiratory effort, no problems with respiration noted  Abdomen: Soft, gravid, appropriate for gestational age. Pain/Pressure: Present     Pelvic:  Cervical exam deferred        Extremities: Normal range of motion.  Edema: None  Mental Status: Normal mood and affect. Normal behavior. Normal judgment and thought content.   Urinalysis:      Assessment and Plan:  Pregnancy: G2P1001 at [redacted]w[redacted]d  1. Supervision of high risk pregnancy, antepartum Stable  2. Chronic hypertension in pregnancy BP elevated today. Pt reports  taking BP meds and BASA Reports BP at home 130's/80's BPP 10/10 today Will send to MAU for further eval Pt is open to IOL 39-40 weeks   3. GBS bacteriuria Tx while in labor  Preterm labor symptoms and general obstetric precautions including but not limited to vaginal bleeding, contractions, leaking of fluid and fetal movement were reviewed in detail with the patient. Please refer to After Visit Summary for other counseling recommendations.  Return in about 1 week (around 03/15/2018) for OB visit, NST/BPP as scheduled.   Chancy Milroy, MD

## 2018-03-08 NOTE — Progress Notes (Signed)
Dr. Glo Herring notified of BP 169/89.  Rn to admin Apresoline 10mg  IV push. Recheck BP after 15 minutes, repeat dose if elevated after 15 minutes.  RN to notify MD of sustained elevated BP.

## 2018-03-08 NOTE — MAU Note (Signed)
Pt was in clinic for scheduled BPP & NST & noted HTN.  Denies h/a, visual changes, or epigastric pain.  Assessment normal.

## 2018-03-09 DIAGNOSIS — O10913 Unspecified pre-existing hypertension complicating pregnancy, third trimester: Secondary | ICD-10-CM | POA: Diagnosis present

## 2018-03-09 LAB — CREATININE CLEARANCE, URINE, 24 HOUR
COLLECTION INTERVAL-CRCL: 24 h
CREATININE 24H UR: 1511 mg/d (ref 600–1800)
CREATININE, URINE: 30.84 mg/dL
Creatinine Clearance: 198 mL/min — ABNORMAL HIGH (ref 75–115)
URINE TOTAL VOLUME-CRCL: 4900 mL

## 2018-03-09 LAB — RPR: RPR Ser Ql: NONREACTIVE

## 2018-03-09 LAB — PROTEIN, URINE, 24 HOUR
COLLECTION INTERVAL-UPROT: 24 h
Protein, Urine: <6 mg/dL
Urine Total Volume-UPROT: 4900 mL

## 2018-03-09 LAB — HIV ANTIBODY (ROUTINE TESTING W REFLEX): HIV Screen 4th Generation wRfx: NONREACTIVE

## 2018-03-09 MED ORDER — LABETALOL HCL 200 MG PO TABS
200.0000 mg | ORAL_TABLET | Freq: Three times a day (TID) | ORAL | Status: DC
  Administered 2018-03-09 – 2018-03-10 (×4): 200 mg via ORAL
  Filled 2018-03-09 (×3): qty 1

## 2018-03-09 MED ORDER — LABETALOL HCL 200 MG PO TABS: 400.0000 mg | ORAL_TABLET | Freq: Three times a day (TID) | ORAL | Status: DC

## 2018-03-09 NOTE — Progress Notes (Signed)
FACULTY PRACTICE ANTEPARTUM(COMPREHENSIVE) NOTE  Regina Foley is a 32 y.o. G2P1001 at [redacted]w[redacted]d  who is admitted for chronic hypertension with BP elevation,noted in office Friday 155/105, and admitted with labetalol increased to 400 tid, and aldomet 250 bid continued, and Mag sulfate added. BP's reduced significantly and labetalol reduced back to 200 tid .   Fetal presentation is cephalic. Length of Stay:  1  Days  Subjective: Pt feels well. No SVT episodes Patient reports the fetal movement as active. Patient reports uterine contraction  activity as none. Patient reports  vaginal bleeding as none. Patient describes fluid per vagina as None.  Vitals:  Blood pressure (!) 144/80, pulse 98, temperature 98.5 F (36.9 C), temperature source Oral, resp. rate 18, height 5\' 6"  (1.676 m), weight 216 lb (98 kg), last menstrual period 07/05/2017, SpO2 100 %. Physical Examination:  General appearance - alert, well appearing, and in no distress, oriented to person, place, and time and playful, active Heart - normal rate and regular rhythm Abdomen - soft, nontender, nondistended Fundal Height:  size equals dates Cervical Exam: Not evaluated. anExtremities: extremities normal, atraumatic, no cyanosis or edema and Homans sign is negative, no sign of DVT with DTRs 2+ bilaterally Membranes:intact  Fetal Monitoring:  Baseline: 120-125 bpm, Variability: Good {> 6 bpm), Accelerations: Reactive and Decelerations: Absent  Labs:  Results for orders placed or performed during the hospital encounter of 03/08/18 (from the past 24 hour(s))  Protein / creatinine ratio, urine   Collection Time: 03/08/18 10:14 AM  Result Value Ref Range   Creatinine, Urine 63.00 mg/dL   Total Protein, Urine 8 mg/dL   Protein Creatinine Ratio 0.13 0.00 - 0.15 mg/mg[Cre]  CBC   Collection Time: 03/08/18 12:14 PM  Result Value Ref Range   WBC 11.8 (H) 4.0 - 10.5 K/uL   RBC 4.03 3.87 - 5.11 MIL/uL   Hemoglobin 11.9 (L) 12.0 - 15.0 g/dL    HCT 34.9 (L) 36.0 - 46.0 %   MCV 86.6 78.0 - 100.0 fL   MCH 29.5 26.0 - 34.0 pg   MCHC 34.1 30.0 - 36.0 g/dL   RDW 13.0 11.5 - 15.5 %   Platelets 224 150 - 400 K/uL  Comprehensive metabolic panel   Collection Time: 03/08/18 12:14 PM  Result Value Ref Range   Sodium 136 135 - 145 mmol/L   Potassium 4.3 3.5 - 5.1 mmol/L   Chloride 105 101 - 111 mmol/L   CO2 21 (L) 22 - 32 mmol/L   Glucose, Bld 80 65 - 99 mg/dL   BUN 7 6 - 20 mg/dL   Creatinine, Ser 0.53 0.44 - 1.00 mg/dL   Calcium 9.2 8.9 - 10.3 mg/dL   Total Protein 7.2 6.5 - 8.1 g/dL   Albumin 3.0 (L) 3.5 - 5.0 g/dL   AST 15 15 - 41 U/L   ALT 10 (L) 14 - 54 U/L   Alkaline Phosphatase 107 38 - 126 U/L   Total Bilirubin 0.4 0.3 - 1.2 mg/dL   GFR calc non Af Amer >60 >60 mL/min   GFR calc Af Amer >60 >60 mL/min   Anion gap 10 5 - 15  HIV antibody (routine testing) (NOT for [facility])   Collection Time: 03/08/18  2:03 PM  Result Value Ref Range   HIV Screen 4th Generation wRfx Non Reactive Non Reactive  RPR   Collection Time: 03/08/18  2:03 PM  Result Value Ref Range   RPR Ser Ql Non Reactive Non Reactive  Type and screen  Oracle   Collection Time: 03/08/18  2:03 PM  Result Value Ref Range   ABO/RH(D) A POS    Antibody Screen NEG    Sample Expiration      03/11/2018 Performed at [facility], [address], [place], [place] [address]   ABO/Rh   Collection Time: 03/08/18  2:03 PM  Result Value Ref Range   ABO/RH(D)      A POS Performed at [facility], [address], [place], [place] [address]   Results for orders placed or performed in visit on 03/08/18 (from the past 24 hour(s))  POCT urinalysis dip (device)   Collection Time: 03/08/18 10:14 AM  Result Value Ref Range   Glucose, UA NEGATIVE NEGATIVE mg/dL   Bilirubin Urine NEGATIVE NEGATIVE   Ketones, ur NEGATIVE NEGATIVE mg/dL   Specific Gravity, Urine 1.015 1.005 - 1.030   Hgb urine dipstick NEGATIVE NEGATIVE   pH 7.0 5.0 - 8.0    Protein, ur NEGATIVE NEGATIVE mg/dL   Urobilinogen, UA 0.2 0.0 - 1.0 mg/dL   Nitrite NEGATIVE NEGATIVE   Leukocytes, UA SMALL (A) NEGATIVE    Imaging Studies:     Currently EPIC will not allow sonographic studies to automatically populate into notes.  In the meantime, copy and paste results into note or free text.  Medications:  Scheduled . aspirin EC  81 mg Oral Daily  . betamethasone acetate-betamethasone sodium phosphate  12 mg Intramuscular Q24 Hr x 2  . docusate sodium  100 mg Oral Daily  . labetalol  200 mg Oral Q8H  . methyldopa  250 mg Oral Q8H   I have reviewed the patient's current medications.  ASSESSMENT: Patient Active Problem List   Diagnosis Date Noted  . Chronic hypertension in obstetric context in third trimester 03/09/2018  . GBS bacteriuria 03/08/2018  . Chronic hypertension during pregnancy, antepartum 03/08/2018  . Oral herpes 01/21/2018  . Anemia 12/28/2017  . Round ligament pain 12/28/2017  . Supervision of high risk pregnancy, antepartum 11/26/2017  . Chronic hypertension in pregnancy 11/26/2017  . History of cardiac arrest 11/26/2017  . History of supraventricular tachycardia 11/26/2017  . H/O LEEP 11/26/2017  . Tachycardia- history of PSVT with ? attempted RFA at [facility] Nov 2013 06/26/2013  . Altered mental status- syncope while in CT- intubated then extubated  06/26/2013  . Conversion reaction- reported final diagnosis from [place] work up 06/26/2013    PLAN: Will d/c mag sulfate and restart the former outpt dosing of Labetalol at 200 tid and aldomet 250 bid. Will give second BMZ dose at 3 pm Will monitor vs til a.m.  outpt care Sunday after future BP med dosing decided on Awaiting completion of 24 hr TP , due after  3 pm.  [name] 03/09/2018,9:56 AM    Patient ID: Regina Foley, female   DOB: 04-25-1986, 32 y.o.   MRN: [id number]

## 2018-03-10 DIAGNOSIS — Z3A35 35 weeks gestation of pregnancy: Secondary | ICD-10-CM

## 2018-03-10 LAB — CULTURE, BETA STREP (GROUP B ONLY)

## 2018-03-10 MED ORDER — METHYLDOPA 250 MG PO TABS: 250.0000 mg | ORAL_TABLET | Freq: Three times a day (TID) | ORAL | 5 refills | Status: DC

## 2018-03-10 MED ORDER — LABETALOL HCL 200 MG PO TABS: 200.0000 mg | ORAL_TABLET | Freq: Three times a day (TID) | ORAL | 5 refills | Status: DC

## 2018-03-10 NOTE — Progress Notes (Signed)
Discharge instructions and prescriptions given to pt. Pt educated on pre-eclampsia symptoms, when to contact the MD, upcoming appointments, and medications. Pt verbalized understanding and has no questions or concerns at this time. Dr. Glo Herring reviewed fetal monitoring strip and said she was ok to go home. IV taken out and pt tolerated well. Pt walked down in stable condition.

## 2018-03-10 NOTE — Discharge Summary (Signed)
Antenatal Physician Discharge Summary  Patient ID: Regina Foley MRN: [id number] DOB/AGE: Sep 29, 1986 32 y.o.  Admit date: 03/08/2018 Discharge date: 03/10/2018  Admission Diagnoses: Chronic hypertension in the third trimester;  Elevated blood pressure concerning for possible superimposed preeclampsia  Discharge Diagnoses: Chronic hypertension in the third trimester; no superimposed preeclampsia for now  Prenatal Procedures: NST and Preeclampsia evaluation  Hospital Course:  This is a 32 y.o. G2P1001 with IUP at [redacted]w[redacted]d with CHTN and notable cardiac history admitted for evaluation of preeclampsia.  She had a severe range BP and was started on magnesium sulfate; BP normalized and magnesium sulfate was discontinued after 16 hours. Labetalol was initially increased to 400 mg tid from baseline of 200 mg tid, and this significantly decreased her BP -> back to 200 mg tid. Also on Aldomet 250 mg tid. BP remained stable, labs were normal, 24 hour protein was too low to calculate.   She was observed, fetal heart rate monitoring remained reassuring, and she had no signs/symptoms of progressing preterm labor or other maternal-fetal concerns.  She did receive two doses of betamethasone. She was deemed stable for discharge to home with outpatient follow up.  Future Appointments  Date Time Provider [place]  03/15/2018  9:15 AM WOC-WOCA NST WOC-WOCA WOC  03/15/2018 10:15 AM Woodroe Mode, MD [facility] WOC  03/22/2018  8:15 AM WOC-WOCA NST WOC-WOCA WOC  03/22/2018  9:15 AM Woodroe Mode, MD WOC-WOCA WOC  03/28/2018  2:00 PM WH-MFC [place] 3 WH-MFCUS MFC-US  03/28/2018  3:15 PM WOC-WOCA NST WOC-WOCA WOC  03/28/2018  4:15 PM Constant, Vickii Chafe, MD [facility] WOC    Discharge Exam: Temp:  [98 F (36.7 C)-99.3 F (37.4 C)] 98.2 F (36.8 C) (03/31 0410) Pulse Rate:  [97-105] 103 (03/31 0410) Resp:  [17-19] 17 (03/31 0410) BP: (110-144)/(64-80) 117/72 (03/31 0410) SpO2:  [94 %-100 %] 99 % (03/31 0410)  Patient  Vitals for the past 24 hrs:  BP Temp Temp src Pulse Resp SpO2  03/10/18 0410 117/72 98.2 F (36.8 C) Oral (!) 103 17 99 %  03/10/18 0004 126/79 98 F (36.7 C) Oral 99 18 94 %  03/09/18 2129 - - - - 19 -  03/09/18 2004 112/66 98 F (36.7 C) Oral (!) 105 17 98 %  03/09/18 1527 110/64 99.3 F (37.4 C) Oral 97 18 99 %  03/09/18 1200 133/76 98.6 F (37 C) Oral 100 18 97 %  03/09/18 0800 (!) 144/80 98.5 F (36.9 C) Oral 98 18 100 %    Physical Examination: CONSTITUTIONAL: Well-developed, well-nourished female in no acute distress.  HENT:  Normocephalic, atraumatic, External right and left ear normal. Oropharynx is clear and moist EYES: Conjunctivae and EOM are normal. Pupils are equal, round, and reactive to light. No scleral icterus.  NECK: Normal range of motion, supple, no masses SKIN: Skin is warm and dry. No rash noted. Not diaphoretic. No erythema. No pallor. [place]: Alert and oriented to person, place, and time. Normal reflexes, muscle tone coordination. No cranial nerve deficit noted. PSYCHIATRIC: Normal mood and affect. Normal behavior. Normal judgment and thought content. CARDIOVASCULAR: Normal heart rate noted, regular rhythm RESPIRATORY: Effort and breath sounds normal, no problems with respiration noted MUSCULOSKELETAL: Normal range of motion. No edema and no tenderness. 2+ distal pulses. ABDOMEN: Soft, nontender, nondistended, gravid. CERVIX:  Deferred  Fetal monitoring: FHR: 140 bpm, Variability: moderate, Accelerations: Present, Decelerations: Absent  Uterine activity: Rare contractions  Significant Diagnostic Studies:  Results for orders placed or performed during the  hospital encounter of 03/08/18 (from the past 168 hour(s))  Protein / creatinine ratio, urine   Collection Time: 03/08/18 10:14 AM  Result Value Ref Range   Creatinine, Urine 63.00 mg/dL   Total Protein, Urine 8 mg/dL   Protein Creatinine Ratio 0.13 0.00 - 0.15 mg/mg[Cre]  CBC   Collection Time:  03/08/18 12:14 PM  Result Value Ref Range   WBC 11.8 (H) 4.0 - 10.5 K/uL   RBC 4.03 3.87 - 5.11 MIL/uL   Hemoglobin 11.9 (L) 12.0 - 15.0 g/dL   HCT 34.9 (L) 36.0 - 46.0 %   MCV 86.6 78.0 - 100.0 fL   MCH 29.5 26.0 - 34.0 pg   MCHC 34.1 30.0 - 36.0 g/dL   RDW 13.0 11.5 - 15.5 %   Platelets 224 150 - 400 K/uL  Comprehensive metabolic panel   Collection Time: 03/08/18 12:14 PM  Result Value Ref Range   Sodium 136 135 - 145 mmol/L   Potassium 4.3 3.5 - 5.1 mmol/L   Chloride 105 101 - 111 mmol/L   CO2 21 (L) 22 - 32 mmol/L   Glucose, Bld 80 65 - 99 mg/dL   BUN 7 6 - 20 mg/dL   Creatinine, Ser 0.53 0.44 - 1.00 mg/dL   Calcium 9.2 8.9 - 10.3 mg/dL   Total Protein 7.2 6.5 - 8.1 g/dL   Albumin 3.0 (L) 3.5 - 5.0 g/dL   AST 15 15 - 41 U/L   ALT 10 (L) 14 - 54 U/L   Alkaline Phosphatase 107 38 - 126 U/L   Total Bilirubin 0.4 0.3 - 1.2 mg/dL   GFR calc non Af Amer >60 >60 mL/min   GFR calc Af Amer >60 >60 mL/min   Anion gap 10 5 - 15  HIV antibody (routine testing) (NOT for [facility])   Collection Time: 03/08/18  2:03 PM  Result Value Ref Range   HIV Screen 4th Generation wRfx Non Reactive Non Reactive  RPR   Collection Time: 03/08/18  2:03 PM  Result Value Ref Range   RPR Ser Ql Non Reactive Non Reactive  Type and screen [place]   Collection Time: 03/08/18  2:03 PM  Result Value Ref Range   ABO/RH(D) A POS    Antibody Screen NEG    Sample Expiration      03/11/2018 Performed at [facility], [address] [place], [place] [address]   ABO/Rh   Collection Time: 03/08/18  2:03 PM  Result Value Ref Range   ABO/RH(D)      A POS Performed at [facility], [address], [place], [place] [address]   Protein, urine, 24 hour   Collection Time: 03/08/18  2:30 PM  Result Value Ref Range   Urine Total Volume-UPROT 4,900 mL   Collection Interval-UPROT 24 hours   Protein, Urine <6 mg/dL   Protein, 24H Urine        50 - 100 mg/day  Creatinine clearance,  urine, 24 hour   Collection Time: 03/08/18  2:30 PM  Result Value Ref Range   Urine Total Volume-CRCL 4,900 mL   Collection Interval-CRCL 24 hours   Creatinine, Urine 30.84 mg/dL   Creatinine, 24H Ur 1,511 600 - 1,800 mg/day   Creatinine Clearance 198 (H) 75 - 115 mL/min  Culture, beta strep (group b only)   Collection Time: 03/08/18  2:37 PM  Result Value Ref Range   Specimen Description      VAGINAL/RECTAL Performed at [facility], [address] [place], [place] [address]    Special Requests      NONE Performed at [facility], [address] [place], [place] [address]    Culture      CULTURE REINCUBATED FOR BETTER GROWTH Performed at [facility], [place] [address], [place], [place] [address]    Report Status PENDING   Results for orders placed or performed in visit on 03/08/18 (from the past 168 hour(s))  POCT urinalysis dip (device)   Collection Time: 03/08/18 10:14 AM  Result Value Ref Range   Glucose, UA NEGATIVE NEGATIVE mg/dL   Bilirubin Urine NEGATIVE NEGATIVE   Ketones, ur NEGATIVE NEGATIVE mg/dL   Specific Gravity, Urine 1.015 1.005 - 1.030   Hgb urine dipstick NEGATIVE NEGATIVE   pH 7.0 5.0 - 8.0   Protein, ur NEGATIVE NEGATIVE mg/dL   Urobilinogen, UA 0.2 0.0 - 1.0 mg/dL   Nitrite NEGATIVE NEGATIVE   Leukocytes, UA SMALL (A) NEGATIVE    Discharge Condition: Stable   Disposition: Home  Allergies as of 03/10/2018      Reactions   Diclegis [doxylamine-pyridoxine] Anaphylaxis, Swelling   Hydrocodone Nausea And Vomiting   Compazine [prochlorperazine Edisylate]    unknown   Nifedipine Other (See Comments)   Severe h/a   Reglan [metoclopramide]    unknown      Medication List    TAKE these medications   acetaminophen 500 MG tablet Commonly known as:  TYLENOL Take by mouth.   aspirin EC 81 MG tablet Take 1 tablet (81 mg total) by mouth daily. Take after 12 weeks for prevention of preeclampsia later in pregnancy   labetalol 200 MG  tablet Commonly known as:  NORMODYNE Take 1 tablet (200 mg total) by mouth 3 (three) times daily. What changed:  how to take this   methyldopa 250 MG tablet Commonly known as:  ALDOMET Take 1 tablet (250 mg total) by mouth 3 (three) times daily.   promethazine 6.25 MG/5ML syrup Commonly known as:  PHENERGAN Take 5 mLs (6.25 mg total) by mouth every 6 (six) hours as needed for nausea or vomiting.   valACYclovir 1000 MG tablet Commonly known as:  VALTREX Take 1 tablet (1,000 mg total) by mouth daily. Take for 5 days      Follow-up Information    [facility] Follow up on 03/15/2018.   Why:  9:15 am as scheduled for fetal testing and clinic visit Contact information: [place] [phone]          Signed: Verita Schneiders M.D. 03/10/2018, 7:04 AM

## 2018-03-10 NOTE — Discharge Instructions (Signed)
Hypertension During Pregnancy °Hypertension, commonly called high blood pressure, is when the force of blood pumping through your arteries is too strong. Arteries are blood vessels that carry blood from the heart throughout the body. Hypertension during pregnancy can cause problems for you and your baby. Your baby may be born early (prematurely) or may not weigh as much as he or she should at birth. Very bad cases of hypertension during pregnancy can be life-threatening. °Different types of hypertension can occur during pregnancy. These include: °· Chronic hypertension. This happens when: °? You have hypertension before pregnancy and it continues during pregnancy. °? You develop hypertension before you are [redacted] weeks pregnant, and it continues during pregnancy. °· Gestational hypertension. This is hypertension that develops after the 20th week of pregnancy. °· Preeclampsia, also called toxemia of pregnancy. This is a very serious type of hypertension that develops only during pregnancy. It affects the whole body, and it can be very dangerous for you and your baby. ° °Gestational hypertension and preeclampsia usually go away within 6 weeks after your baby is born. Women who have hypertension during pregnancy have a greater chance of developing hypertension later in life or during future pregnancies. °What are the causes? °The exact cause of hypertension is not known. °What increases the risk? °There are certain factors that make it more likely for you to develop hypertension during pregnancy. These include: °· Having hypertension during a previous pregnancy or prior to pregnancy. °· Being overweight. °· Being older than age 40. °· Being pregnant for the first time or being pregnant with more than one baby. °· Becoming pregnant using fertilization methods such as IVF (in vitro fertilization). °· Having diabetes, kidney problems, or systemic lupus erythematosus. °· Having a family history of hypertension. ° °What are the  signs or symptoms? °Chronic hypertension and gestational hypertension rarely cause symptoms. Preeclampsia causes symptoms, which may include: °· Increased protein in your urine. Your health care provider will check for this at every visit before you give birth (prenatal visit). °· Severe headaches. °· Sudden weight gain. °· Swelling of the hands, face, legs, and feet. °· Nausea and vomiting. °· Vision problems, such as blurred or double vision. °· Numbness in the face, arms, legs, and feet. °· Dizziness. °· Slurred speech. °· Sensitivity to bright lights. °· Abdominal pain. °· Convulsions. ° °How is this diagnosed? °You may be diagnosed with hypertension during a routine prenatal exam. At each prenatal visit, you may: °· Have a urine test to check for high amounts of protein in your urine. °· Have your blood pressure checked. A blood pressure reading is recorded as two numbers, such as "120 over 80" (or 120/80). The first ("top") number is called the systolic pressure. It is a measure of the pressure in your arteries when your heart beats. The second ("bottom") number is called the diastolic pressure. It is a measure of the pressure in your arteries as your heart relaxes between beats. Blood pressure is measured in a unit called mm Hg. A normal blood pressure reading is: °? Systolic: below 120. °? Diastolic: below 80. ° °The type of hypertension that you are diagnosed with depends on your test results and when your symptoms developed. °· Chronic hypertension is usually diagnosed before 20 weeks of pregnancy. °· Gestational hypertension is usually diagnosed after 20 weeks of pregnancy. °· Hypertension with high amounts of protein in the urine is diagnosed as preeclampsia. °· Blood pressure measurements that stay above 160 systolic, or above 110 diastolic, are   signs of severe preeclampsia. ° °How is this treated? °Treatment for hypertension during pregnancy varies depending on the type of hypertension you have and how  serious it is. °· If you take medicines called ACE inhibitors to treat chronic hypertension, you may need to switch medicines. ACE inhibitors should not be taken during pregnancy. °· If you have gestational hypertension, you may need to take blood pressure medicine. °· If you are at risk for preeclampsia, your health care provider may recommend that you take a low-dose aspirin every day to prevent high blood pressure during your pregnancy. °· If you have severe preeclampsia, you may need to be hospitalized so you and your baby can be monitored closely. You may also need to take medicine (magnesium sulfate) to prevent seizures and to lower blood pressure. This medicine may be given as an injection or through an IV tube. °· In some cases, if your condition gets worse, you may need to deliver your baby early. ° °Follow these instructions at home: °Eating and drinking °· Drink enough fluid to keep your urine clear or pale yellow. °· Eat a healthy diet that is low in salt (sodium). Do not add salt to your food. Check food labels to see how much sodium a food or beverage contains. °Lifestyle °· Do not use any products that contain nicotine or tobacco, such as cigarettes and e-cigarettes. If you need help quitting, ask your health care provider. °· Do not use alcohol. °· Avoid caffeine. °· Avoid stress as much as possible. Rest and get plenty of sleep. °General instructions °· Take over-the-counter and prescription medicines only as told by your health care provider. °· While lying down, lie on your left side. This keeps pressure off your baby. °· While sitting or lying down, raise (elevate) your feet. Try putting some pillows under your lower legs. °· Exercise regularly. Ask your health care provider what kinds of exercise are best for you. °· Keep all prenatal and follow-up visits as told by your health care provider. This is important. °Contact a health care provider if: °· You have symptoms that your health care  provider told you may require more treatment or monitoring, such as: °? Fever. °? Vomiting. °? Headache. °Get help right away if: °· You have severe abdominal pain or vomiting that does not get better with treatment. °· You suddenly develop swelling in your hands, ankles, or face. °· You gain 4 lbs (1.8 kg) or more in 1 week. °· You develop vaginal bleeding, or you have blood in your urine. °· You do not feel your baby moving as much as usual. °· You have blurred or double vision. °· You have muscle twitching or sudden tightening (spasms). °· You have shortness of breath. °· Your lips or fingernails turn blue. °This information is not intended to replace advice given to you by your health care provider. Make sure you discuss any questions you have with your health care provider. °Document Released: 08/15/2011 Document Revised: 06/16/2016 Document Reviewed: 05/12/2016 °[employer] Interactive Patient Education © 2018 [employer] ° °

## 2018-03-11 LAB — GC/CHLAMYDIA PROBE AMP (~~LOC~~) NOT AT ARMC
Chlamydia: NEGATIVE
Neisseria Gonorrhea: NEGATIVE

## 2018-03-12 ENCOUNTER — Other Ambulatory Visit: Payer: Self-pay

## 2018-03-12 ENCOUNTER — Encounter: Payer: Self-pay | Admitting: Obstetrics & Gynecology

## 2018-03-12 ENCOUNTER — Inpatient Hospital Stay (HOSPITAL_COMMUNITY)
Admission: AD | Admit: 2018-03-12 | Discharge: 2018-03-18 | DRG: 807 | Disposition: A | Discharge status: Home / Self Care | Payer: Managed Care, Other (non HMO) | Source: Ambulatory Visit | Attending: Obstetrics & Gynecology | Admitting: Obstetrics & Gynecology

## 2018-03-12 ENCOUNTER — Encounter (HOSPITAL_COMMUNITY): Payer: Self-pay

## 2018-03-12 ENCOUNTER — Encounter (HOSPITAL_COMMUNITY): Payer: Self-pay | Admitting: Anesthesiology

## 2018-03-12 DIAGNOSIS — R030 Elevated blood-pressure reading, without diagnosis of hypertension: Secondary | ICD-10-CM | POA: Diagnosis present

## 2018-03-12 DIAGNOSIS — O114 Pre-existing hypertension with pre-eclampsia, complicating childbirth: Principal | ICD-10-CM | POA: Diagnosis present

## 2018-03-12 DIAGNOSIS — O99824 Streptococcus B carrier state complicating childbirth: Secondary | ICD-10-CM | POA: Diagnosis not present

## 2018-03-12 DIAGNOSIS — O1002 Pre-existing essential hypertension complicating childbirth: Secondary | ICD-10-CM | POA: Diagnosis present

## 2018-03-12 DIAGNOSIS — B002 Herpesviral gingivostomatitis and pharyngotonsillitis: Secondary | ICD-10-CM

## 2018-03-12 DIAGNOSIS — Z3A35 35 weeks gestation of pregnancy: Secondary | ICD-10-CM | POA: Diagnosis not present

## 2018-03-12 DIAGNOSIS — Z8674 Personal history of sudden cardiac arrest: Secondary | ICD-10-CM

## 2018-03-12 DIAGNOSIS — O0990 Supervision of high risk pregnancy, unspecified, unspecified trimester: Secondary | ICD-10-CM

## 2018-03-12 DIAGNOSIS — Z8679 Personal history of other diseases of the circulatory system: Secondary | ICD-10-CM

## 2018-03-12 DIAGNOSIS — O163 Unspecified maternal hypertension, third trimester: Secondary | ICD-10-CM | POA: Diagnosis present

## 2018-03-12 DIAGNOSIS — O10913 Unspecified pre-existing hypertension complicating pregnancy, third trimester: Secondary | ICD-10-CM | POA: Diagnosis not present

## 2018-03-12 DIAGNOSIS — Z9889 Other specified postprocedural states: Secondary | ICD-10-CM

## 2018-03-12 DIAGNOSIS — O10919 Unspecified pre-existing hypertension complicating pregnancy, unspecified trimester: Secondary | ICD-10-CM

## 2018-03-12 DIAGNOSIS — Z3A36 36 weeks gestation of pregnancy: Secondary | ICD-10-CM | POA: Diagnosis not present

## 2018-03-12 LAB — COMPREHENSIVE METABOLIC PANEL
ALBUMIN: 3.1 g/dL — AB (ref 3.5–5.0)
ALT: 12 U/L — AB (ref 14–54)
AST: 23 U/L (ref 15–41)
Alkaline Phosphatase: 112 U/L (ref 38–126)
Anion gap: 13 (ref 5–15)
BUN: 12 mg/dL (ref 6–20)
CO2: 19 mmol/L — ABNORMAL LOW (ref 22–32)
CREATININE: 0.53 mg/dL (ref 0.44–1.00)
Calcium: 9.5 mg/dL (ref 8.9–10.3)
Chloride: 103 mmol/L (ref 101–111)
GFR calc Af Amer: >60 mL/min (ref >60)
GLUCOSE: 87 mg/dL (ref 65–99)
Potassium: 4.4 mmol/L (ref 3.5–5.1)
Sodium: 135 mmol/L (ref 135–145)
Total Bilirubin: 0.5 mg/dL (ref 0.3–1.2)
Total Protein: 7.4 g/dL (ref 6.5–8.1)

## 2018-03-12 LAB — CBC
HEMATOCRIT: 38.5 % (ref 36.0–46.0)
Hemoglobin: 13.1 g/dL (ref 12.0–15.0)
MCH: 29.6 pg (ref 26.0–34.0)
MCHC: 34 g/dL (ref 30.0–36.0)
MCV: 86.9 fL (ref 78.0–100.0)
PLATELETS: 271 10*3/uL (ref 150–400)
RBC: 4.43 MIL/uL (ref 3.87–5.11)
RDW: 13 % (ref 11.5–15.5)
WBC: 16.3 10*3/uL — AB (ref 4.0–10.5)

## 2018-03-12 LAB — URINALYSIS, ROUTINE W REFLEX MICROSCOPIC
Bilirubin Urine: NEGATIVE
GLUCOSE, UA: NEGATIVE mg/dL
HGB URINE DIPSTICK: NEGATIVE
KETONES UR: NEGATIVE mg/dL
LEUKOCYTES UA: NEGATIVE
Nitrite: NEGATIVE
PH: 7 (ref 5.0–8.0)
Protein, ur: NEGATIVE mg/dL
Specific Gravity, Urine: 1.016 (ref 1.005–1.030)

## 2018-03-12 LAB — TYPE AND SCREEN
ABO/RH(D): A POS
ANTIBODY SCREEN: NEGATIVE

## 2018-03-12 LAB — PROTEIN / CREATININE RATIO, URINE
CREATININE, URINE: 110 mg/dL
Protein Creatinine Ratio: 0.12 mg/mg{Cre} (ref 0.00–0.15)
Total Protein, Urine: 13 mg/dL

## 2018-03-12 MED ORDER — LACTATED RINGERS IV BOLUS
250.0000 mL | Freq: Once | INTRAVENOUS | Status: AC
  Administered 2018-03-12: 250 mL via INTRAVENOUS

## 2018-03-12 MED ORDER — ASPIRIN 81 MG PO CHEW
81.0000 mg | CHEWABLE_TABLET | Freq: Every day | ORAL | Status: DC
  Administered 2018-03-13: 81 mg via ORAL
  Filled 2018-03-12 (×3): qty 1

## 2018-03-12 MED ORDER — LABETALOL HCL 5 MG/ML IV SOLN
20.0000 mg | INTRAVENOUS | Status: DC | PRN
  Administered 2018-03-15: 10 mg via INTRAVENOUS
  Filled 2018-03-12: qty 4

## 2018-03-12 MED ORDER — MAGNESIUM SULFATE BOLUS VIA INFUSION
4.0000 g | Freq: Once | INTRAVENOUS | Status: AC
  Administered 2018-03-12: 4 g via INTRAVENOUS
  Filled 2018-03-12: qty 500

## 2018-03-12 MED ORDER — CALCIUM CARBONATE ANTACID 500 MG PO CHEW: 2.0000 | CHEWABLE_TABLET | ORAL | Status: DC | PRN

## 2018-03-12 MED ORDER — LACTATED RINGERS IV SOLN
INTRAVENOUS | Status: DC
  Administered 2018-03-12: 75 mL/h via INTRAVENOUS

## 2018-03-12 MED ORDER — DOCUSATE SODIUM 100 MG PO CAPS
100.0000 mg | ORAL_CAPSULE | Freq: Every day | ORAL | Status: DC
  Administered 2018-03-13: 100 mg via ORAL
  Filled 2018-03-12: qty 1

## 2018-03-12 MED ORDER — MAGNESIUM SULFATE 40 G IN LACTATED RINGERS - SIMPLE
2.0000 g/h | INTRAVENOUS | Status: DC
  Filled 2018-03-12: qty 500

## 2018-03-12 MED ORDER — HYDRALAZINE HCL 20 MG/ML IJ SOLN: 10.0000 mg | Freq: Once | INTRAMUSCULAR | Status: DC | PRN

## 2018-03-12 MED ORDER — ZOLPIDEM TARTRATE 5 MG PO TABS: 5.0000 mg | ORAL_TABLET | Freq: Every evening | ORAL | Status: DC | PRN

## 2018-03-12 MED ORDER — ENOXAPARIN SODIUM 40 MG/0.4ML ~~LOC~~ SOLN
40.0000 mg | SUBCUTANEOUS | Status: DC
  Administered 2018-03-12: 40 mg via SUBCUTANEOUS
  Filled 2018-03-12 (×2): qty 0.4

## 2018-03-12 MED ORDER — LACTATED RINGERS IV SOLN
INTRAVENOUS | Status: DC
  Administered 2018-03-13 – 2018-03-16 (×4): via INTRAVENOUS

## 2018-03-12 MED ORDER — ACETAMINOPHEN 325 MG PO TABS
650.0000 mg | ORAL_TABLET | ORAL | Status: DC | PRN
  Administered 2018-03-13 (×3): 650 mg via ORAL
  Filled 2018-03-12 (×3): qty 2

## 2018-03-12 MED ORDER — PRENATAL MULTIVITAMIN CH
1.0000 | ORAL_TABLET | Freq: Every day | ORAL | Status: DC
  Administered 2018-03-13: 1 via ORAL
  Filled 2018-03-12: qty 1

## 2018-03-12 MED ORDER — PROMETHAZINE HCL 25 MG/ML IJ SOLN
25.0000 mg | Freq: Four times a day (QID) | INTRAMUSCULAR | Status: DC | PRN
  Administered 2018-03-13: 25 mg via INTRAVENOUS
  Filled 2018-03-12: qty 1

## 2018-03-12 NOTE — MAU Provider Note (Signed)
History     CSN: [id number]  Arrival date and time: 03/12/18 1233   First Provider Initiated Contact with Patient 03/12/18 1331      Chief Complaint  Patient presents with  . BP eval  . Shortness of Breath  . Dizziness   Regina Foley is a 32 y.o. G2P1001 at [redacted]w[redacted]d wks chronic hypertensive with hx SVT presenting with home BP elevations and dizziness and lightheadedness especially when standing and walking. Lightheadedness is continuous but waxes and wanes. It is associated with gradual onset SOB, none at rest or at present. Noted BP 160s/100s last night when her dizziness began, slept all night but still felt bad with BP elevations 160s/110s this am. Taking Labetalol and Aldomet as directed with last Labetalol at about 11:30 am. Hx syncope once when non-pregnant, none this pregnancy. Eating and drinking well. Admitted 3/29-3/31/2019 for sever range BP with preE ruled out; given short MgSO4 course and BMZ x2. Has scotomata after walking. No other visual disturbance. No H/A. No change in mild irregular UCs since 5 days ago. No LOF or VB. Good FM.     OB History  Gravida Para Term Preterm AB Living  2 1 1     1   SAB TAB Ectopic Multiple Live Births          1    # Outcome Date GA Lbr Len/2nd Weight Sex Delivery Anes PTL Lv  2 Current           1 Term 2008 [redacted]w[redacted]d  7 lb 9 oz (3.43 kg) F Vag-Spont EPI  LIV     Birth Comments: Hyperemesis,HTN    Past Medical History:  Diagnosis Date  . Cardiac arrest ([place])   . Cervical cancer ([place])   . Hypertension     Past Surgical History:  Procedure Laterality Date  . APPENDECTOMY    . CERVICAL CONE BIOPSY    . CHOLECYSTECTOMY    . LEEP    . Ovarian cyst     drained    Family History  Problem Relation Age of Onset  . Scoliosis Mother   . Heart disease Father   . Hypertension Father   . Diabetes Father   . Hyperlipidemia Father   . COPD Father   . Asthma Father     Social History   Tobacco Use  . Smoking status: Never Smoker  .  Smokeless tobacco: Never Used  Substance Use Topics  . Alcohol use: No    Frequency: Never  . Drug use: No    Allergies:  Allergies  Allergen Reactions  . Diclegis [Doxylamine-Pyridoxine] Anaphylaxis and Swelling  . Hydrocodone Nausea And Vomiting  . Compazine [Prochlorperazine Edisylate]     Swelling of the throat. Took with metoclopramide, not sure which causes reaction.  . Nifedipine Other (See Comments)    Severe h/a  . Reglan [Metoclopramide]     Swelling of throat. Took with compazine, not sure which causes reaction.    Medications Prior to Admission  Medication Sig Dispense Refill Last Dose  . aspirin EC 81 MG tablet Take 1 tablet (81 mg total) by mouth daily. Take after 12 weeks for prevention of preeclampsia later in pregnancy 300 tablet 2 03/12/2018 at Unknown time  . labetalol (NORMODYNE) 200 MG tablet Take 1 tablet (200 mg total) by mouth 3 (three) times daily. 90 tablet 5 03/12/2018 at 1130  . methyldopa (ALDOMET) 250 MG tablet Take 1 tablet (250 mg total) by mouth 3 (three) times daily. 90 tablet 5  03/12/2018 at Unknown time  . acetaminophen (TYLENOL) 500 MG tablet Take 1,000 mg by mouth every 6 (six) hours as needed for moderate pain or headache.    03/10/2018  . promethazine (PHENERGAN) 6.25 MG/5ML syrup Take 5 mLs (6.25 mg total) by mouth every 6 (six) hours as needed for nausea or vomiting. 120 mL 0 03/08/2018  . valACYclovir (VALTREX) 1000 MG tablet Take 1 tablet (1,000 mg total) by mouth daily. Take for 5 days (Patient not taking: Reported on 02/20/2018) 5 tablet 2 Not Taking at Unknown time    Review of Systems  Constitutional: Negative for diaphoresis and fever.  HENT: Negative for congestion.   Eyes: Positive for visual disturbance.  Respiratory: Positive for shortness of breath. Negative for cough, wheezing and stridor.   Cardiovascular: Negative for chest pain, palpitations and leg swelling.  Gastrointestinal: Negative for abdominal pain, nausea and vomiting.   Genitourinary: Negative for dysuria and vaginal bleeding.  Musculoskeletal: Negative for back pain.  Skin: Negative for pallor.  Neurological: Positive for dizziness and light-headedness. Negative for syncope, weakness and headaches.  Psychiatric/Behavioral: The patient is not nervous/anxious.    Physical Exam   Blood pressure (!) 149/118, pulse (!) 109, temperature 98.5 F (36.9 C), temperature source Oral, resp. rate 18, height 5\' 6"  (1.676 m), weight 215 lb 8 oz (97.8 kg), last menstrual period 07/05/2017, SpO2 100 %. Patient Vitals for the past 24 hrs:  BP Temp Temp src Pulse Resp SpO2 Height Weight  03/12/18 1516 (!) 148/108 - - 98 - - - -  03/12/18 1500 (!) 139/99 - - (!) 107 - - - -  03/12/18 1446 (!) 149/102 - - (!) 107 - - - -  03/12/18 1416 (!) 146/108 - - (!) 111 - - - -  03/12/18 1401 (!) 152/106 - - 100 - - - -  03/12/18 1331 (!) 150/104 - - (!) 103 - - - -  03/12/18 1317 (!) 149/118 - - (!) 109 - - - -  03/12/18 1254 (!) 140/107 98.5 F (36.9 C) Oral (!) 119 18 100 % 5\' 6"  (1.676 m) 215 lb 8 oz (97.8 kg)   Orthostatic VS for the past 24 hrs (Last 3 readings):  BP- Lying Pulse- Lying BP- Sitting BP- Standing at 0 minutes Pulse- Standing at 0 minutes BP- Standing at 3 minutes Pulse- Standing at 3 minutes  03/12/18 1346 - - - - - (!) 123/92 151  03/12/18 1342 - - - 142/86 121 - -  03/12/18 1341 - - (!) 156/103 - - - -  03/12/18 1340 (!) 137/99 105 - - - - -   Physical Exam  Nursing note and vitals reviewed. Constitutional: She is oriented to person, place, and time. She appears well-developed and well-nourished. No distress.  HENT:  Head: Normocephalic.  Eyes: No scleral icterus.  Neck: Neck supple. No thyromegaly present.  Cardiovascular: Normal rate and normal heart sounds.  Respiratory: Effort normal and breath sounds normal.  GI: Soft. There is no tenderness. There is no rebound and no guarding.  Genitourinary: No vaginal discharge found.  Genitourinary  Comments: SVE: posterior, soft, short, closed, -3  Musculoskeletal: Normal range of motion. She exhibits no edema or tenderness.  Neurological: She is alert and oriented to person, place, and time. She has normal reflexes.  Skin: Skin is warm and dry.    MAU Course  Procedures Results for orders placed or performed during the hospital encounter of 03/12/18 (from the past 24 hour(s))  Urinalysis, Routine  w reflex microscopic     Status: None   Collection Time: 03/12/18  1:05 PM  Result Value Ref Range   Color, Urine YELLOW YELLOW   APPearance CLEAR CLEAR   Specific Gravity, Urine 1.016 1.005 - 1.030   pH 7.0 5.0 - 8.0   Glucose, UA NEGATIVE NEGATIVE mg/dL   Hgb urine dipstick NEGATIVE NEGATIVE   Bilirubin Urine NEGATIVE NEGATIVE   Ketones, ur NEGATIVE NEGATIVE mg/dL   Protein, ur NEGATIVE NEGATIVE mg/dL   Nitrite NEGATIVE NEGATIVE   Leukocytes, UA NEGATIVE NEGATIVE  Protein / creatinine ratio, urine     Status: None   Collection Time: 03/12/18  1:05 PM  Result Value Ref Range   Creatinine, Urine 110.00 mg/dL   Total Protein, Urine 13 mg/dL   Protein Creatinine Ratio 0.12 0.00 - 0.15 mg/mg[Cre]  CBC     Status: Abnormal   Collection Time: 03/12/18  1:52 PM  Result Value Ref Range   WBC 16.3 (H) 4.0 - 10.5 K/uL   RBC 4.43 3.87 - 5.11 MIL/uL   Hemoglobin 13.1 12.0 - 15.0 g/dL   HCT 38.5 36.0 - 46.0 %   MCV 86.9 78.0 - 100.0 fL   MCH 29.6 26.0 - 34.0 pg   MCHC 34.0 30.0 - 36.0 g/dL   RDW 13.0 11.5 - 15.5 %   Platelets 271 150 - 400 K/uL  Comprehensive metabolic panel     Status: Abnormal   Collection Time: 03/12/18  1:52 PM  Result Value Ref Range   Sodium 135 135 - 145 mmol/L   Potassium 4.4 3.5 - 5.1 mmol/L   Chloride 103 101 - 111 mmol/L   CO2 19 (L) 22 - 32 mmol/L   Glucose, Bld 87 65 - 99 mg/dL   BUN 12 6 - 20 mg/dL   Creatinine, Ser 0.53 0.44 - 1.00 mg/dL   Calcium 9.5 8.9 - 10.3 mg/dL   Total Protein 7.4 6.5 - 8.1 g/dL   Albumin 3.1 (L) 3.5 - 5.0 g/dL   AST 23  15 - 41 U/L   ALT 12 (L) 14 - 54 U/L   Alkaline Phosphatase 112 38 - 126 U/L   Total Bilirubin 0.5 0.3 - 1.2 mg/dL   GFR calc non Af Amer >60 >60 mL/min   GFR calc Af Amer >60 >60 mL/min   Anion gap 13 5 - 15    Fetal monitoring Baseseline 150, moderate variability, acceleerations present, no decelerations Toco: UCs q 4 min x30-40 sec.  MDM History SVT and MI, with cards evaluation 12/2017 (including normal echo, ECG, and only 1 episode SVT on Holter). Adequately hydrated by UA. History and orthostatic VS c/w orthostatic hypotension> c/w Dr. Rip Harbour and will give IV LR 250cc bolus and run at 75cc/hr. 1530: After about 400 cc infused, still feeling lightheaded at rest. FHR remains stable. BP 148/108  Assessment and Plan  Seveerhypertension and orthostatic hypotension C/W Dr. Rip Harbour Admit, hold antihypertensives, start magnesium sulfate   Cortland Crehan CNM 03/12/2018, 1:34 PM

## 2018-03-12 NOTE — MAU Note (Addendum)
Pt presents with c/o SOB, dizziness, and floaters.  Reports BP checked @ home elevated last pm & this morning.  Reports Hx of CHTN and on Labetalol & Aldomet. Admitted on Friday, March 29 for ^BP & discharged home Sunday. Spoke to MD, instructed to be seen in MAU. Denies LOF or VB.

## 2018-03-12 NOTE — H&P (Addendum)
Regina Foley is a 32 y.o. female presenting for lightheadedness,dizziness  and elevated home blood pressures. She continued to be symptomatic after IV hydration and had elevated pressures with one in severe range. Orthostatic VS were significant for tachycardia to 150 and BP drop with standing. She had leukocytosis and otherwise normal preE labs.   Patient Active Problem List   Diagnosis Date Noted  . Severe hypertension affecting pregnancy in third trimester 03/12/2018  . Chronic hypertension in obstetric context in third trimester 03/09/2018  . GBS bacteriuria 03/08/2018  . Chronic hypertension during pregnancy, antepartum 03/08/2018  . Oral herpes 01/21/2018  . Anemia 12/28/2017  . Round ligament pain 12/28/2017  . Supervision of high risk pregnancy, antepartum 11/26/2017  . Chronic hypertension in pregnancy 11/26/2017  . History of cardiac arrest 11/26/2017  . History of supraventricular tachycardia 11/26/2017  . H/O LEEP 11/26/2017  . Tachycardia- history of PSVT with ? attempted RFA at [facility] Nov 2013 06/26/2013  . Altered mental status- syncope while in CT- intubated then extubated  06/26/2013  . Conversion reaction- reported final diagnosis from [place] work up 06/26/2013   OB History    Gravida  2   Para  1   Term  1   Preterm      AB      Living  1     SAB      TAB      Ectopic      Multiple      Live Births  1          Past Medical History:  Diagnosis Date  . Cardiac arrest ([place])   . Cervical cancer ([place])   . Hypertension    Past Surgical History:  Procedure Laterality Date  . APPENDECTOMY    . CERVICAL CONE BIOPSY    . CHOLECYSTECTOMY    . LEEP    . Ovarian cyst     drained   Family History: family history includes Asthma in her father; COPD in her father; Diabetes in her father; Heart disease in her father; Hyperlipidemia in her father; Hypertension in her father; Scoliosis in her mother. Social History:  reports that she has never smoked. She has  never used smokeless tobacco. She reports that she does not drink alcohol or use drugs.     Maternal Diabetes: No Genetic Screening: Normal Maternal Ultrasounds/Referrals: Normal Fetal Ultrasounds or other Referrals:  None Maternal Substance Abuse:  No Significant Maternal Medications:  Meds include: Other: Labetalol, Aldomet Significant Maternal Lab Results:  Lab values include: Group B Strep positive Other Comments:  None  Review of Systems  Constitutional: Negative for chills and fever.  Eyes: Negative for blurred vision, double vision and photophobia.       Scotomata intermittently  Respiratory: Positive for shortness of breath. Negative for cough and hemoptysis.        Mild dyspnea with activity  Cardiovascular: Negative for chest pain, palpitations and leg swelling.  Gastrointestinal: Positive for abdominal pain. Negative for nausea and vomiting.       UCs mild x 5 days  Genitourinary: Negative for dysuria.  Musculoskeletal: Negative for myalgias.  Neurological: Negative for focal weakness and headaches.  Psychiatric/Behavioral: The patient is not nervous/anxious.    Maternal Medical History:  Reason for admission: Nausea.  Contractions: Frequency: irregular.   Perceived severity is mild.    Fetal activity: Perceived fetal activity is normal.   Last perceived fetal movement was within the past hour.    Prenatal complications: PIH.  Prenatal Complications - Diabetes: none.    Dilation: Closed Effacement (%): (short) Exam by:: Poe, CNM Blood pressure (!) 133/110, pulse 90, temperature 98.6 F (37 C), temperature source Oral, resp. rate 16, height 5\' 6"  (1.676 m), weight 215 lb (97.5 kg), last menstrual period 07/05/2017, SpO2 99 %. Maternal Exam:  Uterine Assessment: Contraction strength is mild.  Contraction frequency is irregular.   Abdomen: Patient reports no abdominal tenderness. Fetal presentation: vertex  Introitus: Normal vulva. Vagina is negative for  discharge.  Ferning test: not done.  Nitrazine test: not done. Amniotic fluid character: not assessed.  Pelvis: adequate for delivery.   Cervix: Cervix evaluated by digital exam.   Shortened/closed/high, cephalic  Fetal Exam Fetal Monitor Review: Mode: ultrasound.   Baseline rate: 150.  Variability: moderate (6-25 bpm).   Pattern: accelerations present and no decelerations.    Fetal State Assessment: Category I - tracings are normal.     Physical Exam  Nursing note and vitals reviewed. Constitutional: She is oriented to person, place, and time. She appears well-developed and well-nourished. No distress.  HENT:  Head: Normocephalic.  Eyes: Left eye exhibits no discharge.  Neck: Neck supple. No thyromegaly present.  Cardiovascular: Normal rate.  Respiratory: Effort normal.  GI: Soft. There is no tenderness. There is no guarding.  Genitourinary: Vagina normal. No vaginal discharge found.  Musculoskeletal: Normal range of motion.  Neurological: She is alert and oriented to person, place, and time.  Skin: Skin is warm and dry.  Psychiatric: She has a normal mood and affect.    Prenatal labs: ABO, Rh: --/--/A POS (04/02 1515) Antibody: NEG (04/02 1515) Rubella: Immune (09/21 0000) RPR: Non Reactive (03/29 1403)  HBsAg: Negative (09/21 0000)  HIV: Non Reactive (03/29 1403)  GBS: Positive (09/21 0000)   Assessment/Plan: G2P1001 at [redacted]w[redacted]d CHTN with elevated BPs and concern for evolving superimposed preeclampsia  Orthostatic changed  Per Dr. Rip Harbour Admit and hold antihypertensive meds,  Start magnesium sulfate, follow preE parameters  Deirdre Poe CNM 03/12/2018, 6:51 PM   OB Attending Pt seen and examined.  Recently observed for elevated BP in light of CHTN. Sx as noted above started yesterday after and did not improve with BP medications or rest. W/U no evidence of HELLP. BP's no in severe range. Fetal well being reassuring. However in ligght of Sx will admit pt, hold  BP meds, start magnesium, follow BP's and repeat labs in AM. Will discuss with MFM further management. POC discussed with pt. She verbalized understanding

## 2018-03-13 ENCOUNTER — Encounter (HOSPITAL_COMMUNITY): Payer: Self-pay

## 2018-03-13 DIAGNOSIS — O10913 Unspecified pre-existing hypertension complicating pregnancy, third trimester: Secondary | ICD-10-CM

## 2018-03-13 DIAGNOSIS — Z3A35 35 weeks gestation of pregnancy: Secondary | ICD-10-CM

## 2018-03-13 LAB — CBC
HCT: 34 % — ABNORMAL LOW (ref 36.0–46.0)
Hemoglobin: 11.4 g/dL — ABNORMAL LOW (ref 12.0–15.0)
MCH: 29.1 pg (ref 26.0–34.0)
MCHC: 33.5 g/dL (ref 30.0–36.0)
MCV: 86.7 fL (ref 78.0–100.0)
PLATELETS: 245 10*3/uL (ref 150–400)
RBC: 3.92 MIL/uL (ref 3.87–5.11)
RDW: 13.1 % (ref 11.5–15.5)
WBC: 10.8 10*3/uL — AB (ref 4.0–10.5)

## 2018-03-13 LAB — COMPREHENSIVE METABOLIC PANEL
ALBUMIN: 2.7 g/dL — AB (ref 3.5–5.0)
ALT: 11 U/L — ABNORMAL LOW (ref 14–54)
AST: 22 U/L (ref 15–41)
Alkaline Phosphatase: 98 U/L (ref 38–126)
Anion gap: 11 (ref 5–15)
BUN: 10 mg/dL (ref 6–20)
CHLORIDE: 103 mmol/L (ref 101–111)
CO2: 19 mmol/L — ABNORMAL LOW (ref 22–32)
Calcium: 7.7 mg/dL — ABNORMAL LOW (ref 8.9–10.3)
Creatinine, Ser: 0.54 mg/dL (ref 0.44–1.00)
GFR calc Af Amer: >60 mL/min (ref >60)
GFR calc non Af Amer: >60 mL/min (ref >60)
GLUCOSE: 124 mg/dL — AB (ref 65–99)
Potassium: 4 mmol/L (ref 3.5–5.1)
Sodium: 133 mmol/L — ABNORMAL LOW (ref 135–145)
Total Bilirubin: 0.4 mg/dL (ref 0.3–1.2)
Total Protein: 6.4 g/dL — ABNORMAL LOW (ref 6.5–8.1)

## 2018-03-13 LAB — TSH: TSH: 1.018 u[IU]/mL (ref 0.350–4.500)

## 2018-03-13 LAB — PROTEIN / CREATININE RATIO, URINE
Creatinine, Urine: 32 mg/dL
Total Protein, Urine: <6 mg/dL

## 2018-03-13 MED ORDER — SODIUM CHLORIDE 0.9 % IV SOLN
5.0000 10*6.[IU] | Freq: Once | INTRAVENOUS | Status: AC
  Administered 2018-03-13: 5 10*6.[IU] via INTRAVENOUS
  Filled 2018-03-13: qty 5

## 2018-03-13 MED ORDER — LABETALOL HCL 200 MG PO TABS
400.0000 mg | ORAL_TABLET | Freq: Two times a day (BID) | ORAL | Status: DC
  Administered 2018-03-13: 400 mg via ORAL
  Filled 2018-03-13: qty 2

## 2018-03-13 MED ORDER — OXYTOCIN BOLUS FROM INFUSION
500.0000 mL | Freq: Once | INTRAVENOUS | Status: AC
  Administered 2018-03-15: 500 mL via INTRAVENOUS

## 2018-03-13 MED ORDER — LACTATED RINGERS IV SOLN: 500.0000 mL | INTRAVENOUS | Status: DC | PRN

## 2018-03-13 MED ORDER — FLEET ENEMA 7-19 GM/118ML RE ENEM: 1.0000 | ENEMA | RECTAL | Status: DC | PRN

## 2018-03-13 MED ORDER — MISOPROSTOL 50MCG HALF TABLET
50.0000 ug | ORAL_TABLET | ORAL | Status: DC | PRN
  Administered 2018-03-13 – 2018-03-14 (×3): 50 ug via ORAL
  Filled 2018-03-13 (×4): qty 1

## 2018-03-13 MED ORDER — LACTATED RINGERS IV SOLN
INTRAVENOUS | Status: DC
  Administered 2018-03-13: 14:00:00 via INTRAVENOUS

## 2018-03-13 MED ORDER — OXYCODONE-ACETAMINOPHEN 5-325 MG PO TABS
1.0000 | ORAL_TABLET | Freq: Once | ORAL | Status: AC
  Administered 2018-03-13: 1 via ORAL
  Filled 2018-03-13: qty 1

## 2018-03-13 MED ORDER — PENICILLIN G POT IN DEXTROSE 60000 UNIT/ML IV SOLN
3.0000 10*6.[IU] | INTRAVENOUS | Status: DC
  Administered 2018-03-13 – 2018-03-15 (×11): 3 10*6.[IU] via INTRAVENOUS
  Filled 2018-03-13 (×22): qty 50

## 2018-03-13 MED ORDER — METHYLDOPA 250 MG PO TABS
250.0000 mg | ORAL_TABLET | Freq: Two times a day (BID) | ORAL | Status: DC
  Administered 2018-03-13: 250 mg via ORAL
  Filled 2018-03-13 (×3): qty 1

## 2018-03-13 MED ORDER — MAGNESIUM SULFATE 40 G IN LACTATED RINGERS - SIMPLE
2.0000 g/h | INTRAVENOUS | Status: DC
  Administered 2018-03-13 – 2018-03-16 (×4): 2 g/h via INTRAVENOUS
  Filled 2018-03-13: qty 40
  Filled 2018-03-13: qty 500
  Filled 2018-03-13 (×2): qty 40
  Filled 2018-03-13: qty 500

## 2018-03-13 MED ORDER — LIDOCAINE HCL (PF) 1 % IJ SOLN
30.0000 mL | INTRAMUSCULAR | Status: AC | PRN
  Administered 2018-03-15 (×2): 6 mL via SUBCUTANEOUS

## 2018-03-13 MED ORDER — ACETAMINOPHEN 325 MG PO TABS
650.0000 mg | ORAL_TABLET | ORAL | Status: DC | PRN
  Administered 2018-03-14 – 2018-03-15 (×4): 650 mg via ORAL
  Filled 2018-03-13 (×4): qty 2

## 2018-03-13 MED ORDER — ONDANSETRON HCL 4 MG/2ML IJ SOLN
4.0000 mg | Freq: Four times a day (QID) | INTRAMUSCULAR | Status: DC | PRN
  Administered 2018-03-15 – 2018-03-16 (×3): 4 mg via INTRAVENOUS
  Filled 2018-03-13 (×4): qty 2

## 2018-03-13 MED ORDER — OXYTOCIN 40 UNITS IN LACTATED RINGERS INFUSION - SIMPLE MED
2.5000 [IU]/h | INTRAVENOUS | Status: DC
  Administered 2018-03-16: 2.5 [IU]/h via INTRAVENOUS

## 2018-03-13 MED ORDER — SOD CITRATE-CITRIC ACID 500-334 MG/5ML PO SOLN
30.0000 mL | ORAL | Status: DC | PRN
  Filled 2018-03-13: qty 15

## 2018-03-13 MED ORDER — TERBUTALINE SULFATE 1 MG/ML IJ SOLN: 0.2500 mg | Freq: Once | INTRAMUSCULAR | Status: DC | PRN

## 2018-03-13 NOTE — Progress Notes (Signed)
Patient ID: Regina Foley, female   DOB: April 07, 1986, 32 y.o.   MRN: [id number] [place] ANTEPARTUM COMPREHENSIVE PROGRESS NOTE  Regina Foley is a 32 y.o. G2P1001 at [redacted]w[redacted]d  who is admitted for [facility].   Fetal presentation is cephalic. Length of Stay:  1  Days  Subjective: Pt reports feeling some better today in regards to the dizzy feeling and SOB. But has a HA today.  + FM. No VB, LOF or Ut ctx  Vitals:  Blood pressure (!) 151/100, pulse 97, temperature 97.7 F (36.5 C), temperature source Oral, resp. rate 18, height 5\' 6"  (1.676 m), weight 97.5 kg (215 lb), last menstrual period 07/05/2017, SpO2 100 %.   Physical Examination: Lungs clear Heart RRR Abd soft + BS gravid Ext trace edema, non tender  Fetal Monitoring:  130-140's, + accels, reactive  Labs:  Results for orders placed or performed during the hospital encounter of 03/12/18 (from the past 24 hour(s))  Urinalysis, Routine w reflex microscopic   Collection Time: 03/12/18  1:05 PM  Result Value Ref Range   Color, Urine YELLOW YELLOW   APPearance CLEAR CLEAR   Specific Gravity, Urine 1.016 1.005 - 1.030   pH 7.0 5.0 - 8.0   Glucose, UA NEGATIVE NEGATIVE mg/dL   Hgb urine dipstick NEGATIVE NEGATIVE   Bilirubin Urine NEGATIVE NEGATIVE   Ketones, ur NEGATIVE NEGATIVE mg/dL   Protein, ur NEGATIVE NEGATIVE mg/dL   Nitrite NEGATIVE NEGATIVE   Leukocytes, UA NEGATIVE NEGATIVE  Protein / creatinine ratio, urine   Collection Time: 03/12/18  1:05 PM  Result Value Ref Range   Creatinine, Urine 110.00 mg/dL   Total Protein, Urine 13 mg/dL   Protein Creatinine Ratio 0.12 0.00 - 0.15 mg/mg[Cre]  CBC   Collection Time: 03/12/18  1:52 PM  Result Value Ref Range   WBC 16.3 (H) 4.0 - 10.5 K/uL   RBC 4.43 3.87 - 5.11 MIL/uL   Hemoglobin 13.1 12.0 - 15.0 g/dL   HCT 38.5 36.0 - 46.0 %   MCV 86.9 78.0 - 100.0 fL   MCH 29.6 26.0 - 34.0 pg   MCHC 34.0 30.0 - 36.0 g/dL   RDW 13.0 11.5 - 15.5 %   Platelets 271 150 - 400 K/uL   Comprehensive metabolic panel   Collection Time: 03/12/18  1:52 PM  Result Value Ref Range   Sodium 135 135 - 145 mmol/L   Potassium 4.4 3.5 - 5.1 mmol/L   Chloride 103 101 - 111 mmol/L   CO2 19 (L) 22 - 32 mmol/L   Glucose, Bld 87 65 - 99 mg/dL   BUN 12 6 - 20 mg/dL   Creatinine, Ser 0.53 0.44 - 1.00 mg/dL   Calcium 9.5 8.9 - 10.3 mg/dL   Total Protein 7.4 6.5 - 8.1 g/dL   Albumin 3.1 (L) 3.5 - 5.0 g/dL   AST 23 15 - 41 U/L   ALT 12 (L) 14 - 54 U/L   Alkaline Phosphatase 112 38 - 126 U/L   Total Bilirubin 0.5 0.3 - 1.2 mg/dL   GFR calc non Af Amer >60 >60 mL/min   GFR calc Af Amer >60 >60 mL/min   Anion gap 13 5 - 15  Type and screen [place]   Collection Time: 03/12/18  3:15 PM  Result Value Ref Range   ABO/RH(D) A POS    Antibody Screen NEG    Sample Expiration      03/15/2018 Performed at [facility], [place],  [place], [place] [address]   Protein / creatinine ratio, urine   Collection Time: 03/13/18  5:00 AM  Result Value Ref Range   Creatinine, Urine 32.00 mg/dL   Total Protein, Urine <6 mg/dL   Protein Creatinine Ratio        0.00 - 0.15 mg/mg[Cre]  CBC   Collection Time: 03/13/18  5:53 AM  Result Value Ref Range   WBC 10.8 (H) 4.0 - 10.5 K/uL   RBC 3.92 3.87 - 5.11 MIL/uL   Hemoglobin 11.4 (L) 12.0 - 15.0 g/dL   HCT 34.0 (L) 36.0 - 46.0 %   MCV 86.7 78.0 - 100.0 fL   MCH 29.1 26.0 - 34.0 pg   MCHC 33.5 30.0 - 36.0 g/dL   RDW 13.1 11.5 - 15.5 %   Platelets 245 150 - 400 K/uL  Comprehensive metabolic panel   Collection Time: 03/13/18  5:53 AM  Result Value Ref Range   Sodium 133 (L) 135 - 145 mmol/L   Potassium 4.0 3.5 - 5.1 mmol/L   Chloride 103 101 - 111 mmol/L   CO2 19 (L) 22 - 32 mmol/L   Glucose, Bld 124 (H) 65 - 99 mg/dL   BUN 10 6 - 20 mg/dL   Creatinine, Ser 0.54 0.44 - 1.00 mg/dL   Calcium 7.7 (L) 8.9 - 10.3 mg/dL   Total Protein 6.4 (L) 6.5 - 8.1 g/dL   Albumin 2.7 (L) 3.5 - 5.0 g/dL   AST 22 15 - 41 U/L    ALT 11 (L) 14 - 54 U/L   Alkaline Phosphatase 98 38 - 126 U/L   Total Bilirubin 0.4 0.3 - 1.2 mg/dL   GFR calc non Af Amer >60 >60 mL/min   GFR calc Af Amer >60 >60 mL/min   Anion gap 11 5 - 15  TSH   Collection Time: 03/13/18  5:54 AM  Result Value Ref Range   TSH 1.018 0.350 - 4.500 uIU/mL    Imaging Studies:    none   Medications:  Scheduled . aspirin  81 mg Oral Daily  . docusate sodium  100 mg Oral Daily  . enoxaparin (LOVENOX) injection  40 mg Subcutaneous Q24H  . labetalol  400 mg Oral BID  . methyldopa  250 mg Oral BID  . oxyCODONE-acetaminophen  1 tablet Oral Once  . prenatal multivitamin  1 tablet Oral Q1200   I have reviewed the patient's current medications.  ASSESSMENT: IUP 35 6/7 weeks CHTN with exacerbation + GBS urine  PLAN: Discussed case with Dr Griffin Dakin. Per his recommendations will d/c magnesium. Restart BP medications with increase in Labetalol. Continue with fetal monitoring. Will try to get to 37 weeks. If additional changes in BP meds needed, Dr Lucia Gaskins recommended increasing Aldomet. POC reviewed with pt.   Chancy Milroy 03/13/2018,10:59 AM

## 2018-03-13 NOTE — Anesthesia Pain Management Evaluation Note (Signed)
  CRNA Pain Management Visit Note  Patient: Regina Foley, 32 y.o., female  "Hello I am a member of the anesthesia team at [facility]. We have an anesthesia team available at all times to provide care throughout the hospital, including epidural management and anesthesia for C-section. I don't know your plan for the delivery whether it a natural birth, water birth, IV sedation, nitrous supplementation, doula or epidural, but we want to meet your pain goals."   1.Was your pain managed to your expectations on prior hospitalizations?   Yes   2.What is your expectation for pain management during this hospitalization?     Epidural  3.How can we help you reach that goal? epidural  Record the patient's initial score and the patient's pain goal.   Pain: 0  Pain Goal: 5 [facility] wants you to be able to say your pain was always managed very well.  Pressley Barsky 03/13/2018

## 2018-03-13 NOTE — Progress Notes (Signed)
Dr Si Raider notified of no change in Headache.  Patient rating 7/10.  Patient complaining of floaters.    Dr. Si Raider to put in transfer orders to birthing suites.  Rn to call report to [employer].

## 2018-03-13 NOTE — Progress Notes (Signed)
LABOR PROGRESS NOTE  Subjective:  Patient seen and examined for progress of labor. Patient comfortable with IV pain meds. Endorses contractions.  Objective:  Vitals:   03/13/18 1931 03/13/18 2001 03/13/18 2011 03/13/18 2030  BP: (!) 146/98 (!) 162/98 (!) 158/98 (!) 145/98  Pulse: 89 88 100 93  Resp: 16 15    Temp:      TempSrc:      SpO2:      Weight:      Height:       Dilation: Closed Effacement (%): 50 Cervical Position: Posterior Station: Ballotable Presentation: Vertex Exam by:: Darliss Ridgel RN  FHT: 145 bpm, moderate variability, accelerations present, absent decelerations TOCO: irregular, every 3-5 minutes  Assessment/Plan: Brystol Wasilewski is a 32 y.o. G2P1001 at [redacted]w[redacted]d here for IOL due to [place] with SIEP. BP 140-150/90s on labetalol.  S/p Cytotec x2.  Will consider FB during next cervical check.  Labor: stage 1 Preeclampsia: on IV mag Fetal wellbeing: category 1 Pain control: minimal, IV pain meds PRN, would like epidural I/D: GBS+ on PCN Anticipated MOD: continue expectant management, anticipate SVD  Harriet Butte, DO, PGY-2 03/13/2018, 9:13 PM

## 2018-03-13 NOTE — Progress Notes (Signed)
Pt admitted with chronic hypertension and concern for superimposed preeclampsia. Complains of headache that now remains moderate to severe and no relief with Percocet. Also with new onset of visual disturbance (floaters). BPs mildly elevated, labs w/o severe features. Received betamethasone x2 last week (admitted then for concern for sipe). Given severe headache that has not resolved with opioid and new onset visual disturbance, think this to be superimposed preeclampsia with severe features. As is past 34 weeks, am not ordering further steroids. Will start magnesium for seizure prophylaxis. Discussed at length w/ patient and FOB risks/benefits of induction at this gestational age, they agree with proceeding w/ induction. Category 1 fetal tracing.

## 2018-03-14 ENCOUNTER — Encounter (HOSPITAL_COMMUNITY): Payer: Self-pay | Admitting: Anesthesiology

## 2018-03-14 MED ORDER — TERBUTALINE SULFATE 1 MG/ML IJ SOLN: 0.2500 mg | Freq: Once | INTRAMUSCULAR | Status: DC | PRN

## 2018-03-14 MED ORDER — FENTANYL CITRATE (PF) 100 MCG/2ML IJ SOLN
INTRAMUSCULAR | Status: AC
  Administered 2018-03-14: 100 ug via INTRAVENOUS
  Filled 2018-03-14: qty 2

## 2018-03-14 MED ORDER — FENTANYL CITRATE (PF) 100 MCG/2ML IJ SOLN
100.0000 ug | INTRAMUSCULAR | Status: DC | PRN
  Administered 2018-03-14 – 2018-03-15 (×6): 100 ug via INTRAVENOUS
  Filled 2018-03-14 (×5): qty 2

## 2018-03-14 MED ORDER — OXYTOCIN 40 UNITS IN LACTATED RINGERS INFUSION - SIMPLE MED
1.0000 m[IU]/min | INTRAVENOUS | Status: DC
  Administered 2018-03-14: 2 m[IU]/min via INTRAVENOUS
  Administered 2018-03-15: 24 m[IU]/min via INTRAVENOUS
  Filled 2018-03-14 (×2): qty 1000

## 2018-03-14 NOTE — Progress Notes (Signed)
32 y/o G2P1001 at 360d admitted for IL for htn and headache. On mag, s/p betamethasone.  Subjective: Doing well. Feeling contractions, pain well controlled.   Objective: BP (!) 125/91   Pulse 93   Temp 98.3 F (36.8 C) (Oral)   Resp 16   Ht 5\' 6"  (1.676 m)   Wt 97.5 kg (215 lb)   LMP 07/05/2017   SpO2 99%   BMI 34.70 kg/m  I/O last 3 completed shifts: In: 5774.4 [P.O.:3322; I.V.:2052.4; IV Piggyback:400] Out: 75916 [Urine:11000; Emesis/NG output:240] Total I/O In: 384 [P.O.:120; I.V.:750; IV Piggyback:50] Out: 2050 [Urine:2050]  FHT: Tracing picking up poorly, makes interpretation difficult.  FHR: 140 bpm, variability: minimal ,  accelerations:  Abscent,  decelerations:  Absent UC:   irregular, every 4 minutes SVE:   Foley bulb in place  Labs: Lab Results  Component Value Date   WBC 10.8 (H) 03/13/2018   HGB 11.4 (L) 03/13/2018   HCT 34.0 (L) 03/13/2018   MCV 86.7 03/13/2018   PLT 245 03/13/2018    Assessment / Plan: Regina Coxis a 32 y.o.G2P1001 at 61w6dhere for IOL due to cHTN with SIEP. BP 120/91 on labetolol.S/p Cytotec x3. Foley bulb in place.   Labor: Progressing normally, foley bulb in place, cytotec x3 Preeclampsia:  on magnesium sulfate, no signs or symptoms of toxicity, intake and ouput balanced and labs stable Fetal Wellbeing:  Category I, difficult tracing Pain Control:  Fentanyl x2 I/D:  n/a Anticipated MOD:  NSVD  Guadalupe Dawn 03/14/2018, 2:27 PM

## 2018-03-14 NOTE — Progress Notes (Addendum)
LABOR PROGRESS NOTE  Subjective:  Patient seen and examined for progress of labor. Patient comfortable with IV pain meds.   Objective:  Vitals:   03/14/18 1930 03/14/18 2000 03/14/18 2030 03/14/18 2100  BP: (!) 154/107 (!) 154/95 (!) 146/96 (!) 158/102  Pulse: 93 93 92 96  Resp: 15 16 17 15   Temp:   98.4 F (36.9 C)   TempSrc:   Oral   SpO2:      Weight:      Height:       Dilation: 4 Effacement (%): 80 Cervical Position: Posterior Station: Ballotable Presentation: Vertex Exam by:: Dr Ofilia Neas FHT: 140 bpm, moderate variability, accelerations present, absent decelerations TOCO: irregular, every 3-5 minutes  Assessment/Plan: 32 y.o. G2P1001 at [redacted]w[redacted]d here for IOL for pre-eclampsia with severe features.  S/p Cytotec x3, FB.  Will titrate pit to achieve adequate labor.  Labor: stage 1 Preeclampsia: on IV mag, BP 140/90s Fetal wellbeing: category 1 Pain control: IV pain meds, planning for epidural I/D: GBS pos, on PCN Anticipated MOD: continue expectant management, anticipate SVD  Harriet Butte, DO, PGY-2 03/14/2018, 10:04 PM

## 2018-03-14 NOTE — Progress Notes (Signed)
LABOR PROGRESS NOTE  Regina Foley is a 32 y.o. G2P1001 at [redacted]w[redacted]d  admitted for IOL for pre-eclampsia with severe features  Subjective: Patient doing well. Denies headache or any other symptoms.   Objective: BP (!) 144/75   Pulse (!) 104   Temp 98.3 F (36.8 C) (Oral)   Resp 16   Ht 5\' 6"  (1.676 m)   Wt 215 lb (97.5 kg)   LMP 07/05/2017   SpO2 99%   BMI 34.70 kg/m  or  Vitals:   03/14/18 1300 03/14/18 1330 03/14/18 1400 03/14/18 1430  BP: 137/80 (!) 141/89 (!) 125/91 (!) 144/75  Pulse: 86 93 93 (!) 104  Resp: 16 16 16 16   Temp:      TempSrc:      SpO2:      Weight:      Height:         FB in place FHT: baseline rate 135, moderate varibility, +acel, no decel Toco: irregular ctx  Assessment / Plan: 32 y.o. G2P1001 at [redacted]w[redacted]d here for IOL for pre-eclampsia with severe features  Labor: FB in palce Fetal Wellbeing:  Cat I Pain Control:  Per patient's request Anticipated MOD:  SVD  Gailen Shelter, MD 03/14/2018

## 2018-03-14 NOTE — Progress Notes (Signed)
LABOR PROGRESS NOTE  Subjective:  Patient seen and examined for progress of labor. Patient comfortable without epidural.   Objective:  Vitals:   03/13/18 2100 03/13/18 2137 03/13/18 2200 03/13/18 2230  BP: (!) 148/97 (!) 146/92 (!) 132/93 (!) 137/94  Pulse: (!) 101 (!) 103 96 (!) 104  Resp: 16 17  16   Temp:      TempSrc:      SpO2:      Weight:      Height:       Dilation: Closed Effacement (%): 50 Cervical Position: Posterior Station: Ballotable Presentation: Vertex Exam by:: Darliss Ridgel RN  FHT: 150 bpm, moderate variability, accelerations present, absent decelerations TOCO: irregular, every 3-5 minutes  Assessment/Plan: Vinita Coxis a 32 y.o.G2P1001 at [redacted]w[redacted]d here for IOL due to cHTN with SIEP. BP 140-150/90s on labetalol.  S/p Cytotec x2.  Unable to insert FB. Will give another dose of Cytotec.  Labor: stage 1 Preeclampsia: on IV mag Fetal wellbeing: category 1 Pain control:minimal, IV pain meds PRN, would like epidural I/D:GBS+ on PCN Anticipated MOD: continue expectant management, anticipate SVD  Harriet Butte, DO, PGY-2 03/14/2018, 12:13 AM

## 2018-03-14 NOTE — Progress Notes (Signed)
Regina Foley is a 32 y.o. G2P1001 at [redacted]w[redacted]d  admitted for induction of labor due to Hypertension and Severe features: headache , now resolved on Mag Sulfate pt is s/p Betamethasone last week.. 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  Objective: BP (!) 147/98   Pulse 85   Temp 98.4 F (36.9 C) (Oral)   Resp 15   Ht 5\' 6"  (1.676 m)   Wt 215 lb (97.5 kg)   LMP 07/05/2017   SpO2 99%   BMI 34.70 kg/m  I/O last 3 completed shifts: In: 5774.4 [P.O.:3322; I.V.:2052.4; IV Piggyback:400] Out: 00370 [Urine:11000; Emesis/NG output:240] No intake/output data recorded.  FHT:  FHR: 135 bpm, variability: moderate,  accelerations:  Present,  decelerations:  Absent UC:   irregular, every 5-6 minutes SVE:   Dilation: 1 Effacement (%): 70 Station: Ballotable Exam by:: Darliss Ridgel RN  Cervix able to be located using uterine packing forceps, and foley inserted Labs: Lab Results  Component Value Date   WBC 10.8 (H) 03/13/2018   HGB 11.4 (L) 03/13/2018   HCT 34.0 (L) 03/13/2018   MCV 86.7 03/13/2018   PLT 245 03/13/2018    Assessment / Plan: Induction of labor due to preeclampsia,  progressing well on pitocin  Labor: Progressing on Pitocin, will continue to increase then AROM Preeclampsia:  on magnesium sulfate Fetal Wellbeing:  Category I Pain Control:  Epidural I/D:  n/a Anticipated MOD:  NSVD  [name] 03/14/2018, 7:45 AM

## 2018-03-14 NOTE — Progress Notes (Signed)
FB remains in place. SVE 2.5 cm/90%/bollatable. Attempted to breakdown more cervical scar tissue. SVE 3cm-3.5cm. Will recheck again in ~2-3 hours if FB still in place.  Almyra Free P. Doshia Dalia, MD OB Fellow

## 2018-03-14 NOTE — Progress Notes (Signed)
LABOR PROGRESS NOTE  Gisella Alwine is a 32 y.o. G2P1001 at [redacted]w[redacted]d  admitted for IOL for pre-eclampsia with severe features  Subjective: Patient doing well. Denies headache or any other symptoms.   Objective: BP (!) 149/93   Pulse 91   Temp 98.3 F (36.8 C) (Oral)   Resp 16   Ht 5\' 6"  (1.676 m)   Wt 215 lb (97.5 kg)   LMP 07/05/2017   SpO2 99%   BMI 34.70 kg/m  or  Vitals:   03/14/18 1730 03/14/18 1800 03/14/18 1830 03/14/18 1900  BP: (!) 143/89 (!) 147/104 (!) 154/95 (!) 149/93  Pulse: 94 93 84 91  Resp: 16 16 16 16   Temp:      TempSrc:      SpO2:      Weight:      Height:       FB in place -- cervix stretched out to 4cm. FB now out. SVE 4/80%/bollatoble   FHT: baseline rate 145, moderate varibility, +acel, no decel Toco: ctx q~5 min  Assessment / Plan: 32 y.o. G2P1001 at [redacted]w[redacted]d here for IOL for pre-eclampsia with severe features  Labor: Continue to titrate PItocin Fetal Wellbeing:  Cat I Pain Control:  IV Fentanyl Anticipated MOD:  SVD  Gailen Shelter, MD 03/14/2018,7:12 PM

## 2018-03-15 ENCOUNTER — Inpatient Hospital Stay (HOSPITAL_COMMUNITY): Payer: Managed Care, Other (non HMO) | Admitting: Anesthesiology

## 2018-03-15 ENCOUNTER — Encounter (HOSPITAL_COMMUNITY): Payer: Self-pay | Admitting: *Deleted

## 2018-03-15 ENCOUNTER — Encounter: Payer: Self-pay | Admitting: Obstetrics & Gynecology

## 2018-03-15 ENCOUNTER — Other Ambulatory Visit: Payer: Self-pay

## 2018-03-15 LAB — CBC
HEMATOCRIT: 36.1 % (ref 36.0–46.0)
Hemoglobin: 12 g/dL (ref 12.0–15.0)
MCH: 29.3 pg (ref 26.0–34.0)
MCHC: 33.2 g/dL (ref 30.0–36.0)
MCV: 88.3 fL (ref 78.0–100.0)
Platelets: 212 10*3/uL (ref 150–400)
RBC: 4.09 MIL/uL (ref 3.87–5.11)
RDW: 13.2 % (ref 11.5–15.5)
WBC: 12.5 10*3/uL — ABNORMAL HIGH (ref 4.0–10.5)

## 2018-03-15 LAB — COMPREHENSIVE METABOLIC PANEL
ALK PHOS: 118 U/L (ref 38–126)
ALT: 12 U/L — AB (ref 14–54)
AST: 22 U/L (ref 15–41)
Albumin: 2.8 g/dL — ABNORMAL LOW (ref 3.5–5.0)
Anion gap: 11 (ref 5–15)
BUN: 7 mg/dL (ref 6–20)
CALCIUM: 7.4 mg/dL — AB (ref 8.9–10.3)
CHLORIDE: 101 mmol/L (ref 101–111)
CO2: 21 mmol/L — AB (ref 22–32)
CREATININE: 0.55 mg/dL (ref 0.44–1.00)
Glucose, Bld: 105 mg/dL — ABNORMAL HIGH (ref 65–99)
Potassium: 4.1 mmol/L (ref 3.5–5.1)
Sodium: 133 mmol/L — ABNORMAL LOW (ref 135–145)
Total Bilirubin: 0.6 mg/dL (ref 0.3–1.2)
Total Protein: 7.5 g/dL (ref 6.5–8.1)

## 2018-03-15 MED ORDER — FENTANYL 2.5 MCG/ML BUPIVACAINE 1/10 % EPIDURAL INFUSION (WH - ANES)
14.0000 mL/h | INTRAMUSCULAR | Status: DC | PRN
  Administered 2018-03-15 (×3): 14 mL/h via EPIDURAL
  Filled 2018-03-15 (×3): qty 100

## 2018-03-15 MED ORDER — FENTANYL CITRATE (PF) 100 MCG/2ML IJ SOLN
50.0000 ug | INTRAMUSCULAR | Status: DC | PRN
  Administered 2018-03-15: 50 ug via INTRAVENOUS
  Filled 2018-03-15: qty 2

## 2018-03-15 MED ORDER — PHENYLEPHRINE 40 MCG/ML (10ML) SYRINGE FOR IV PUSH (FOR BLOOD PRESSURE SUPPORT)
80.0000 ug | PREFILLED_SYRINGE | INTRAVENOUS | Status: DC | PRN
  Filled 2018-03-15: qty 5
  Filled 2018-03-15: qty 10

## 2018-03-15 MED ORDER — EPHEDRINE 5 MG/ML INJ
10.0000 mg | INTRAVENOUS | Status: DC | PRN
  Filled 2018-03-15: qty 2

## 2018-03-15 MED ORDER — PHENYLEPHRINE 40 MCG/ML (10ML) SYRINGE FOR IV PUSH (FOR BLOOD PRESSURE SUPPORT)
80.0000 ug | PREFILLED_SYRINGE | INTRAVENOUS | Status: DC | PRN
  Filled 2018-03-15: qty 5

## 2018-03-15 MED ORDER — LACTATED RINGERS IV SOLN: 500.0000 mL | Freq: Once | INTRAVENOUS | Status: DC

## 2018-03-15 MED ORDER — METHYLERGONOVINE MALEATE 0.2 MG/ML IJ SOLN
INTRAMUSCULAR | Status: AC
  Administered 2018-03-15: 0.2 mg
  Filled 2018-03-15: qty 1

## 2018-03-15 MED ORDER — DIPHENHYDRAMINE HCL 50 MG/ML IJ SOLN: 12.5000 mg | INTRAMUSCULAR | Status: DC | PRN

## 2018-03-15 NOTE — Progress Notes (Signed)
Pt. Requesting not to wear monitors for a moment due to irritation to skin. Pitocin off and CNM okay with patient off monitors for short period.

## 2018-03-15 NOTE — Anesthesia Preprocedure Evaluation (Signed)
Anesthesia Evaluation  Patient identified by MRN, date of birth, ID band Patient awake    Reviewed: Allergy & Precautions, H&P , NPO status , Patient's Chart, lab work & pertinent test results, reviewed documented beta blocker date and time   Airway Mallampati: II  TM Distance: >3 FB Neck ROM: full    Dental no notable dental hx. (+) Teeth Intact   Pulmonary neg pulmonary ROS,    Pulmonary exam normal breath sounds clear to auscultation       Cardiovascular hypertension, Pt. on medications and Pt. on home beta blockers negative cardio ROS Normal cardiovascular exam Rhythm:regular Rate:Normal     Neuro/Psych negative neurological ROS  negative psych ROS   GI/Hepatic negative GI ROS, Neg liver ROS,   Endo/Other  negative endocrine ROS  Renal/GU negative Renal ROS     Musculoskeletal   Abdominal (+) + obese,   Peds  Hematology negative hematology ROS (+)   Anesthesia Other Findings   Reproductive/Obstetrics (+) Pregnancy                             Anesthesia Physical Anesthesia Plan  ASA: III  Anesthesia Plan: Epidural   Post-op Pain Management:    Induction:   PONV Risk Score and Plan:   Airway Management Planned:   Additional Equipment:   Intra-op Plan:   Post-operative Plan:   Informed Consent: I have reviewed the patients History and Physical, chart, labs and discussed the procedure including the risks, benefits and alternatives for the proposed anesthesia with the patient or authorized representative who has indicated his/her understanding and acceptance.     Plan Discussed with:   Anesthesia Plan Comments:         Anesthesia Quick Evaluation

## 2018-03-15 NOTE — Progress Notes (Signed)
LABOR PROGRESS NOTE  Subjective:  Patient seen and examined for progress of labor. Patient comfortable with pain meds.   Objective:  Vitals:   03/15/18 0300 03/15/18 0330 03/15/18 0400 03/15/18 0430  BP: (!) 156/102 (!) 142/93 (!) 137/96 (!) 158/93  Pulse: 89 86 97 96  Resp: 16  18 15   Temp:   98.5 F (36.9 C)   TempSrc:   Oral   SpO2:      Weight:      Height:       Dilation: 3.5 Effacement (%): 70 Cervical Position: Middle Station: -3 Presentation: Vertex Exam by:: Darliss Ridgel RN  FHT: 140 bpm, moderate variability, accelerations present, absent decelerations TOCO: irregular, every 3-5 minutes  Assessment/Plan: 32 y.o.G2P1001 at [redacted]w[redacted]d here for IOL for pre-eclampsia with severe features.  S/p Cytotec x3, FB.  Will continue to titrate pit to achieve adequate labor.  Labor: stage 1 Preeclampsia: on IV mag, BP 150/90s Fetal wellbeing: category 1 Pain control: IV pain meds, planning epidural I/D: GBS pos, on PCN Anticipated MOD: continue expectant management, anticipate SVD  Harriet Butte, DO, PGY-2 03/15/2018, 5:00 AM

## 2018-03-15 NOTE — Anesthesia Procedure Notes (Signed)
Epidural Patient location during procedure: OB Start time: 03/15/2018 12:58 PM End time: 03/15/2018 1:02 PM  Staffing Anesthesiologist: Lyn Hollingshead, MD Performed: anesthesiologist   Preanesthetic Checklist Completed: patient identified, site marked, surgical consent, pre-op evaluation, timeout performed, IV checked, risks and benefits discussed and monitors and equipment checked  Epidural Patient position: sitting Prep: site prepped and draped and DuraPrep Patient monitoring: continuous pulse ox and blood pressure Approach: midline Location: L3-L4 Injection technique: LOR air  Needle:  Needle type: Tuohy  Needle gauge: 17 G Needle length: 9 cm and 9 Needle insertion depth: 6 cm Catheter type: closed end flexible Catheter size: 19 Gauge Catheter at skin depth: 11 cm Test dose: negative and Other  Assessment Sensory level: T9 Events: blood not aspirated, injection not painful, no injection resistance, negative IV test and no paresthesia  Additional Notes Reason for block:procedure for pain

## 2018-03-15 NOTE — Progress Notes (Addendum)
LABOR PROGRESS NOTE  Subjective:  Patient seen and examined for progress of labor. Patient comfortable with epidural. States she cannot feel her contractions.  Objective:  Vitals:   03/15/18 1800 03/15/18 1832 03/15/18 1902 03/15/18 2000  BP: (!) 147/84 (!) 151/78 129/70 (!) 141/86  Pulse: 82 80 86 92  Resp:  18 16   Temp:  98.7 F (37.1 C)    TempSrc:  Oral    SpO2:      Weight:      Height:       Dilation: 6.5 Effacement (%): 80 Cervical Position: Middle Station: -1 Presentation: Vertex Exam by:: caroline neal, CNM FHT: 150 bpm, minimal variability, accelerations present, absent decelerations TOCO: regular, every 3 minutes  Assessment/Plan: 32 y.o.G2P1001 at [redacted]w[redacted]d here for IOL for preE w/SIPE. S/p Cytotec x3, FB, AROM @0925 . IUPC in place. Will continue to titrate pit to achieve adequate labor though may benefit from pit break if no advancement during next check.  Labor: stage 2 Preeclampsia: on IV mag, BP 140/80 Fetal wellbeing: category 1 Pain control: epidural I/D: GBS pos, received PCN Anticipated MOD: continue expectant management, anticipate SVD  Harriet Butte, DO, PGY-2 03/15/2018, 8:58 PM

## 2018-03-15 NOTE — Progress Notes (Signed)
L&D Note  03/15/2018 - 9:27 AM  32 y.o. G2P1001 [redacted]w[redacted]d. Pregnancy complicated by:  Patient Active Problem List   Diagnosis Date Noted  . Severe hypertension affecting pregnancy in third trimester 03/12/2018  . Chronic hypertension in obstetric context in third trimester 03/09/2018  . GBS bacteriuria 03/08/2018  . Chronic hypertension during pregnancy, antepartum 03/08/2018  . Oral herpes 01/21/2018  . Anemia 12/28/2017  . Round ligament pain 12/28/2017  . Supervision of high risk pregnancy, antepartum 11/26/2017  . Chronic hypertension in pregnancy 11/26/2017  . History of cardiac arrest 11/26/2017  . History of supraventricular tachycardia 11/26/2017  . H/O LEEP 11/26/2017  . Tachycardia- history of PSVT with ? attempted RFA at [facility] Nov 2013 06/26/2013  . Altered mental status- syncope while in CT- intubated then extubated  06/26/2013  . Conversion reaction- reported final diagnosis from [place] work up 06/26/2013    Ms. Terita Hellums is admitted for IOL for cHTN with superimposed pre-eclampsia with severe features (HA, BP)   Subjective:  Mild HA (better). Not really feeling UCs. Frustrated with labor process and desires c-section.   Objective:   Vitals:   03/15/18 0757 03/15/18 0812 03/15/18 0822 03/15/18 0831  BP: (!) 159/115 (!) 175/101 (!) 153/103 (!) 161/105  Pulse: 90 92 90 91  Resp:      Temp:      TempSrc:      SpO2:      Weight:      Height:        Current Vital Signs 24h Vital Sign Ranges  T 98.2 F (36.8 C) Temp  Avg: 98.3 F (36.8 C)  Min: 98.1 F (36.7 C)  Max: 98.5 F (36.9 C)  BP (!) 161/105 BP  Min: 125/91  Max: 175/101  HR 91 Pulse  Avg: 90.4  Min: 82  Max: 104  RR 15 Resp  Avg: 15.9  Min: 14  Max: 18  SaO2 99 % (Room Air) No data recorded       24 Hour I/O Current Shift I/O  Time Ins Outs 04/04 0701 - 04/05 0700 In: 4484.2 [P.O.:1490; I.V.:2744.2] Out: 6000 [Urine:6000] No intake/output data recorded.   FHR: 145 baseline, +accels and scalp stim,  no decel, mod variability Toco: q2-54m Gen: NAD CTAB Normal s1 and s2. No mrgs SVE: 4-5/80/-1 with BBOW-->AROM clear fluid. Vigorous sweeping. There does feel to be a thin rim on the anterior portion of the cervix.   Labs:  Recent Labs  Lab 03/12/18 1352 03/13/18 0553 03/15/18 0819  WBC 16.3* 10.8* 12.5*  HGB 13.1 11.4* 12.0  HCT 38.5 34.0* 36.1  PLT 271 245 212   Recent Labs  Lab 03/08/18 1214 03/12/18 1352 03/13/18 0553  NA 136 135 133*  K 4.3 4.4 4.0  CL 105 103 103  CO2 21* 19* 19*  BUN 7 12 10   CREATININE 0.53 0.53 0.54  CALCIUM 9.2 9.5 7.7*  PROT 7.2 7.4 6.4*  BILITOT 0.4 0.5 0.4  ALKPHOS 107 112 98  ALT 10* 12* 11*  AST 15 23 22   GLUCOSE 80 87 124*    Medications Current Facility-Administered Medications  Medication Dose Route Frequency Provider Last Rate Last Dose  . acetaminophen (TYLENOL) tablet 650 mg  650 mg Oral Q4H PRN Wende Mott, CNM   650 mg at 03/15/18 0600  . fentaNYL (SUBLIMAZE) injection 100 mcg  100 mcg Intravenous Q1H PRN Degele, Jenne Pane, MD   100 mcg at 03/14/18 1840  . hydrALAZINE (APRESOLINE) injection 10 mg  10 mg Intravenous Once PRN Len Blalock M, CNM      . labetalol (NORMODYNE,TRANDATE) injection 20-80 mg  20-80 mg Intravenous Q10 min PRN Wende Mott, CNM   10 mg at 03/15/18 0809  . lactated ringers infusion 500-1,000 mL  500-1,000 mL Intravenous PRN Wende Mott, CNM      . lactated ringers infusion   Intravenous Continuous Wende Mott, [place] 75 mL/hr at 03/14/18 1850    . lidocaine (PF) (XYLOCAINE) 1 % injection 30 mL  30 mL Subcutaneous PRN Len Blalock M, CNM      . magnesium sulfate 40 grams in LR 500 mL OB infusion  2 g/hr Intravenous Titrated Wende Mott, CNM 25 mL/hr at 03/15/18 0756 2 g/hr at 03/15/18 0756  . misoprostol (CYTOTEC) tablet 50 mcg  50 mcg Oral Q4H PRN Wende Mott, CNM   50 mcg at 03/14/18 0028  . ondansetron (ZOFRAN) injection 4 mg  4 mg Intravenous Q6H PRN Wende Mott,  CNM      . oxytocin (PITOCIN) IV BOLUS FROM BAG  500 mL Intravenous Once Wende Mott, CNM      . oxytocin (PITOCIN) IV infusion 40 units in LR 1000 mL - Premix  2.5 Units/hr Intravenous Continuous Wende Mott, CNM      . oxytocin (PITOCIN) IV infusion 40 units in LR 1000 mL - Premix  1-40 milli-units/min Intravenous Titrated Seabron Spates, CNM 33 mL/hr at 03/15/18 0653 22 milli-units/min at 03/15/18 0653  . penicillin G potassium 3 Million Units in dextrose 70mL IVPB  3 Million Units Intravenous Q4H Wende Mott, CNM 100 mL/hr at 03/15/18 9622 3 Million Units at 03/15/18 0733  . sodium citrate-citric acid (ORACIT) solution 30 mL  30 mL Oral Q2H PRN Wende Mott, CNM      . sodium phosphate (FLEET) 7-19 GM/118ML enema 1 enema  1 enema Rectal PRN Wende Mott, CNM      . terbutaline (BRETHINE) injection 0.25 mg  0.25 mg Subcutaneous Once PRN Wende Mott, CNM      . terbutaline (BRETHINE) injection 0.25 mg  0.25 mg Subcutaneous Once PRN Seabron Spates, CNM        Assessment & Plan:  Pt progressing *IUP: category I with accels *Labor: long d/w pt prior to SVE. I was able to convince her to at least let me check her and I told her that if AROM was safe at that time that that is what i'd recommend. During SVE, I felt AROM to be safe and recommended such and she was amenable to this and she wanted to know how long would we let her go before saying she had failed her induction and I told her that standard obstetrical time would be 12-18h s/p AROM and on pitocin before considering failed IOL. She stated she wanted a recheck in 4 hours and if no change to have a c-section then. I told her that it's good that her cervix changed and we were able to AROM her and to try and give time to the labor process.  I did try and loosen up that band as much as possible, on the cervix *GBS: pos. S/p pcn x 2 *CV: continue with IV PRNs and Mg. Good UOP and will follow up surveillance  cbc and cmp *Preterm: s/p bmz on 3/29 and 3/30. 3/13: efw 88%, 2602gm, AC >97% *Analgesia: no current needs  Durene Romans MD Attending [facility] [place] (  Water engineer)

## 2018-03-15 NOTE — Progress Notes (Signed)
Labor Progress Note Regina Foley is a 32 y.o. G2P1001 at [redacted]w[redacted]d presented for IOL for CHTN with super imposed preeclampsia  S:  Patient comfortable with epidural  O:  BP (!) 154/96   Pulse 87   Temp 98.7 F (37.1 C) (Oral)   Resp 16   Ht 5\' 6"  (1.676 m)   Wt 215 lb (97.5 kg)   LMP 07/05/2017   SpO2 99%   BMI 34.70 kg/m   Fetal Tracing:  Baseline: 145 Variability: moderate Accels: 15x15 Decels: none  Toco: 3-4  CVE: Dilation: 6.5 Effacement (%): 80 Cervical Position: Middle Station: -1 Presentation: Vertex Exam by:: [name], CNM   A&P: 32 y.o. G2P1001 [redacted]w[redacted]d IOL for CHTN with superimposed preeclampsia #Labor: Progressing well. Discussed placement of IUPC with patient and patient agreeable to plan of care. IUPC placed without difficulty #Pain: epidural #FWB: Cat 1 #GBS positive  Wende Mott, CNM 5:58 PM

## 2018-03-15 NOTE — Progress Notes (Signed)
Vitals:   03/15/18 0000 03/15/18 0004  BP:  (!) 135/93  Pulse:  94  Resp: 17   Temp:  98.1 F (36.7 C)  SpO2:     Not feeling any pain at all.  Has been on Pitocin since this am.  Ptiocin at 20 mu/min. FHR cat 1 .  Will give a 2 hr pitocin break and reastart at 50mu/min.

## 2018-03-15 NOTE — Progress Notes (Signed)
L&D Note  03/15/2018 - 1:43 PM  32 y.o. G2P1001 [redacted]w[redacted]d. Pregnancy complicated by:  Patient Active Problem List   Diagnosis Date Noted  . Severe hypertension affecting pregnancy in third trimester 03/12/2018  . Chronic hypertension in obstetric context in third trimester 03/09/2018  . GBS bacteriuria 03/08/2018  . Chronic hypertension during pregnancy, antepartum 03/08/2018  . Oral herpes 01/21/2018  . Anemia 12/28/2017  . Round ligament pain 12/28/2017  . Supervision of high risk pregnancy, antepartum 11/26/2017  . Chronic hypertension in pregnancy 11/26/2017  . History of cardiac arrest 11/26/2017  . History of supraventricular tachycardia 11/26/2017  . H/O LEEP 11/26/2017  . Tachycardia- history of PSVT with ? attempted RFA at [facility] Nov 2013 06/26/2013  . Altered mental status- syncope while in CT- intubated then extubated  06/26/2013  . Conversion reaction- reported final diagnosis from [place] work up 06/26/2013    [name] is admitted for IOL for cHTN with superimposed pre-eclampsia with severe features (HA, BP)   Subjective:  Epidural just placed  Objective:   Vitals:   03/15/18 1328 03/15/18 1330 03/15/18 1332 03/15/18 1337  BP: (!) 153/99  126/73 115/71  Pulse: 87  87 93  Resp: 18     Temp:  98 F (36.7 C)    TempSrc:  Oral    SpO2:      Weight:      Height:        Current Vital Signs 24h Vital Sign Ranges  T 98 F (36.7 C) Temp  Avg: 98.3 F (36.8 C)  Min: 98 F (36.7 C)  Max: 98.5 F (36.9 C)  BP 115/71 BP  Min: 113/68  Max: 175/101  HR 93 Pulse  Avg: 90.1  Min: 83  Max: 104  RR 18 Resp  Avg: 15.8  Min: 14  Max: 18  SaO2 99 % (Room Air) SpO2  Avg: 99 %  Min: 98 %  Max: 100 %       24 Hour I/O Current Shift I/O  Time Ins Outs 04/04 0701 - 04/05 0700 In: 4484.2 [P.O.:1490; I.V.:2744.2] Out: 6000 [Urine:6000] 04/05 0701 - 04/05 1900 In: 731.2 [P.O.:240; I.V.:491.2] Out: 1600 [Urine:1600]   FHR: 145 baseline, +accels, no decel, mod  variability Toco: q2-20m  Gen: NAD SVE: 6/80/-1. Feels more engaged. Still with rim on anterior-left portion of cervix.    Labs:  Recent Labs  Lab 03/12/18 1352 03/13/18 0553 03/15/18 0819  WBC 16.3* 10.8* 12.5*  HGB 13.1 11.4* 12.0  HCT 38.5 34.0* 36.1  PLT 271 245 212   Recent Labs  Lab 03/12/18 1352 03/13/18 0553 03/15/18 0912  NA 135 133* 133*  K 4.4 4.0 4.1  CL 103 103 101  CO2 19* 19* 21*  BUN 12 10 7   CREATININE 0.53 0.54 0.55  CALCIUM 9.5 7.7* 7.4*  PROT 7.4 6.4* 7.5  BILITOT 0.5 0.4 0.6  ALKPHOS 112 98 118  ALT 12* 11* 12*  AST 23 22 22   GLUCOSE 87 124* 105*    Medications Current Facility-Administered Medications  Medication Dose Route Frequency Provider Last Rate Last Dose  . acetaminophen (TYLENOL) tablet 650 mg  650 mg Oral Q4H PRN Wende Mott, CNM   650 mg at 03/15/18 0600  . diphenhydrAMINE (BENADRYL) injection 12.5 mg  12.5 mg Intravenous Q15 min PRN Hatchett, Franklin, MD      . ePHEDrine injection 10 mg  10 mg Intravenous PRN Lyn Hollingshead, MD      . ePHEDrine injection 10  mg  10 mg Intravenous PRN Lyn Hollingshead, MD      . fentaNYL (SUBLIMAZE) injection 100 mcg  100 mcg Intravenous Q1H PRN Degele, Jenne Pane, MD   100 mcg at 03/15/18 1219  . fentaNYL (SUBLIMAZE) injection 50 mcg  50 mcg Intravenous Q1H PRN Wende Mott, CNM   50 mcg at 03/15/18 1021  . fentaNYL 2.5 mcg/ml w/bupivacaine 0.1% in NS 123ml epidural infusion (WH-ANES)  14 mL/hr Epidural Continuous PRN Lyn Hollingshead, MD 14 mL/hr at 03/15/18 1306 14 mL/hr at 03/15/18 1306  . hydrALAZINE (APRESOLINE) injection 10 mg  10 mg Intravenous Once PRN Len Blalock M, CNM      . labetalol (NORMODYNE,TRANDATE) injection 20-80 mg  20-80 mg Intravenous Q10 min PRN Wende Mott, CNM   10 mg at 03/15/18 0809  . lactated ringers infusion 500 mL  500 mL Intravenous Once Hatchett, Franklin, MD      . lactated ringers infusion 500-1,000 mL  500-1,000 mL Intravenous PRN Wende Mott, CNM      . lactated ringers infusion   Intravenous Continuous Wende Mott, [place] 75 mL/hr at 03/14/18 1850    . magnesium sulfate 40 grams in LR 500 mL OB infusion  2 g/hr Intravenous Titrated Wende Mott, CNM 25 mL/hr at 03/15/18 1300 2 g/hr at 03/15/18 1300  . misoprostol (CYTOTEC) tablet 50 mcg  50 mcg Oral Q4H PRN Wende Mott, CNM   50 mcg at 03/14/18 0028  . ondansetron (ZOFRAN) injection 4 mg  4 mg Intravenous Q6H PRN Wende Mott, CNM      . oxytocin (PITOCIN) IV BOLUS FROM BAG  500 mL Intravenous Once Wende Mott, CNM      . oxytocin (PITOCIN) IV infusion 40 units in LR 1000 mL - Premix  2.5 Units/hr Intravenous Continuous Wende Mott, CNM      . oxytocin (PITOCIN) IV infusion 40 units in LR 1000 mL - Premix  1-40 milli-units/min Intravenous Titrated Seabron Spates, CNM 33 mL/hr at 03/15/18 0653 22 milli-units/min at 03/15/18 0653  . penicillin G potassium 3 Million Units in dextrose 6mL IVPB  3 Million Units Intravenous Q4H Wende Mott, CNM 100 mL/hr at 03/15/18 0626 3 Million Units at 03/15/18 0733  . PHENYLephrine 40 mcg/ml in normal saline Adult IV Push Syringe  80 mcg Intravenous PRN Hatchett, Mateo Flow, MD      . PHENYLephrine 40 mcg/ml in normal saline Adult IV Push Syringe  80 mcg Intravenous PRN Hatchett, Franklin, MD      . sodium citrate-citric acid (ORACIT) solution 30 mL  30 mL Oral Q2H PRN Wende Mott, CNM      . sodium phosphate (FLEET) 7-19 GM/118ML enema 1 enema  1 enema Rectal PRN Wende Mott, CNM      . terbutaline (BRETHINE) injection 0.25 mg  0.25 mg Subcutaneous Once PRN Wende Mott, CNM      . terbutaline (BRETHINE) injection 0.25 mg  0.25 mg Subcutaneous Once PRN Seabron Spates, CNM        Assessment & Plan:  Pt progressing *IUP: category I with accels *Labor: pt amenable to proceeding. Continue pitocin per protocol.  *GBS: pos. S/p pcn x 2 *CV: continue with IV PRNs and Mg. Good UOP and  will follow up surveillance cbc and cmp *Preterm: s/p bmz on 3/29 and 3/30. 3/13: efw 88%, 2602gm, AC >97% *Analgesia: doing well with recently placed epidural.   Durene Romans MD Attending  Center for [employer])

## 2018-03-16 ENCOUNTER — Encounter (HOSPITAL_COMMUNITY): Payer: Self-pay

## 2018-03-16 DIAGNOSIS — Z3A36 36 weeks gestation of pregnancy: Secondary | ICD-10-CM

## 2018-03-16 DIAGNOSIS — O1002 Pre-existing essential hypertension complicating childbirth: Secondary | ICD-10-CM

## 2018-03-16 DIAGNOSIS — O99824 Streptococcus B carrier state complicating childbirth: Secondary | ICD-10-CM

## 2018-03-16 LAB — CBC WITH DIFFERENTIAL/PLATELET
BASOS ABS: 0 10*3/uL (ref 0.0–0.1)
Basophils Relative: 0 %
EOS PCT: 0 %
Eosinophils Absolute: 0 10*3/uL (ref 0.0–0.7)
HCT: 27.5 % — ABNORMAL LOW (ref 36.0–46.0)
Hemoglobin: 9.4 g/dL — ABNORMAL LOW (ref 12.0–15.0)
Lymphocytes Relative: 8 %
Lymphs Abs: 1.9 10*3/uL (ref 0.7–4.0)
MCH: 28.9 pg (ref 26.0–34.0)
MCHC: 34.2 g/dL (ref 30.0–36.0)
MCV: 84.6 fL (ref 78.0–100.0)
MONO ABS: 0.8 10*3/uL (ref 0.1–1.0)
Monocytes Relative: 3 %
Neutro Abs: 22.2 10*3/uL — ABNORMAL HIGH (ref 1.7–7.7)
Neutrophils Relative %: 89 %
PLATELETS: 246 10*3/uL (ref 150–400)
RBC: 3.25 MIL/uL — ABNORMAL LOW (ref 3.87–5.11)
RDW: 12.8 % (ref 11.5–15.5)
WBC: 24.9 10*3/uL — ABNORMAL HIGH (ref 4.0–10.5)

## 2018-03-16 LAB — CBC
HCT: 21.7 % — ABNORMAL LOW (ref 36.0–46.0)
HEMATOCRIT: 32.8 % — AB (ref 36.0–46.0)
HEMOGLOBIN: 7.6 g/dL — AB (ref 12.0–15.0)
Hemoglobin: 11.1 g/dL — ABNORMAL LOW (ref 12.0–15.0)
MCH: 28.9 pg (ref 26.0–34.0)
MCH: 29.8 pg (ref 26.0–34.0)
MCHC: 33.8 g/dL (ref 30.0–36.0)
MCHC: 35 g/dL (ref 30.0–36.0)
MCV: 85.4 fL (ref 78.0–100.0)
MCV: 85.1 fL (ref 78.0–100.0)
PLATELETS: 220 10*3/uL (ref 150–400)
Platelets: 196 10*3/uL (ref 150–400)
RBC: 3.84 MIL/uL — ABNORMAL LOW (ref 3.87–5.11)
RBC: 2.55 MIL/uL — ABNORMAL LOW (ref 3.87–5.11)
RDW: 12.9 % (ref 11.5–15.5)
RDW: 13.1 % (ref 11.5–15.5)
WBC: 24 10*3/uL — AB (ref 4.0–10.5)
WBC: 16.2 10*3/uL — ABNORMAL HIGH (ref 4.0–10.5)

## 2018-03-16 LAB — MAGNESIUM
MAGNESIUM: 4.9 mg/dL — AB (ref 1.7–2.4)
MAGNESIUM: 5 mg/dL — AB (ref 1.7–2.4)

## 2018-03-16 MED ORDER — WITCH HAZEL-GLYCERIN EX PADS: 1.0000 "application " | MEDICATED_PAD | CUTANEOUS | Status: DC | PRN

## 2018-03-16 MED ORDER — OXYTOCIN 40 UNITS IN LACTATED RINGERS INFUSION - SIMPLE MED
2.5000 [IU]/h | Freq: Once | INTRAVENOUS | Status: AC
  Administered 2018-03-16: 2.5 [IU]/h via INTRAVENOUS
  Filled 2018-03-16: qty 1000

## 2018-03-16 MED ORDER — MISOPROSTOL 200 MCG PO TABS
800.0000 ug | ORAL_TABLET | Freq: Once | ORAL | Status: AC
  Administered 2018-03-16: 800 ug via ORAL

## 2018-03-16 MED ORDER — COCONUT OIL OIL: 1.0000 "application " | TOPICAL_OIL | Status: DC | PRN

## 2018-03-16 MED ORDER — SIMETHICONE 80 MG PO CHEW: 80.0000 mg | CHEWABLE_TABLET | ORAL | Status: DC | PRN

## 2018-03-16 MED ORDER — MISOPROSTOL 50MCG HALF TABLET: 100.0000 ug | ORAL_TABLET | Freq: Once | ORAL | Status: DC

## 2018-03-16 MED ORDER — MISOPROSTOL 200 MCG PO TABS: 1000.0000 ug | ORAL_TABLET | Freq: Once | ORAL | Status: DC

## 2018-03-16 MED ORDER — CARBOPROST TROMETHAMINE 250 MCG/ML IM SOLN
250.0000 ug | Freq: Once | INTRAMUSCULAR | Status: AC
  Administered 2018-03-16: 250 ug via INTRAMUSCULAR

## 2018-03-16 MED ORDER — DIPHENOXYLATE-ATROPINE 2.5-0.025 MG PO TABS
2.0000 | ORAL_TABLET | Freq: Once | ORAL | Status: AC
  Administered 2018-03-16: 2 via ORAL
  Filled 2018-03-16: qty 2

## 2018-03-16 MED ORDER — SENNOSIDES-DOCUSATE SODIUM 8.6-50 MG PO TABS
2.0000 | ORAL_TABLET | ORAL | Status: DC
  Administered 2018-03-17 (×2): 2 via ORAL
  Filled 2018-03-16 (×2): qty 2

## 2018-03-16 MED ORDER — MISOPROSTOL 200 MCG PO TABS
ORAL_TABLET | ORAL | Status: AC
  Administered 2018-03-16: 800 ug via ORAL
  Filled 2018-03-16: qty 4

## 2018-03-16 MED ORDER — PRENATAL MULTIVITAMIN CH
1.0000 | ORAL_TABLET | Freq: Every day | ORAL | Status: DC
  Administered 2018-03-16 – 2018-03-17 (×2): 1 via ORAL
  Filled 2018-03-16 (×2): qty 1

## 2018-03-16 MED ORDER — METHYLERGONOVINE MALEATE 0.2 MG PO TABS
0.2000 mg | ORAL_TABLET | ORAL | Status: AC
  Administered 2018-03-16 (×6): 0.2 mg via ORAL
  Filled 2018-03-16 (×6): qty 1

## 2018-03-16 MED ORDER — CARBOPROST TROMETHAMINE 250 MCG/ML IM SOLN
INTRAMUSCULAR | Status: AC
  Administered 2018-03-16: 250 ug via INTRAMUSCULAR
  Filled 2018-03-16: qty 1

## 2018-03-16 MED ORDER — FUROSEMIDE 10 MG/ML IJ SOLN
20.0000 mg | Freq: Once | INTRAMUSCULAR | Status: AC
  Administered 2018-03-16: 20 mg via INTRAVENOUS
  Filled 2018-03-16: qty 2

## 2018-03-16 MED ORDER — DIPHENHYDRAMINE HCL 25 MG PO CAPS: 25.0000 mg | ORAL_CAPSULE | Freq: Four times a day (QID) | ORAL | Status: DC | PRN

## 2018-03-16 MED ORDER — IBUPROFEN 600 MG PO TABS
600.0000 mg | ORAL_TABLET | Freq: Four times a day (QID) | ORAL | Status: DC
  Administered 2018-03-16 – 2018-03-18 (×8): 600 mg via ORAL
  Filled 2018-03-16 (×8): qty 1

## 2018-03-16 MED ORDER — TRANEXAMIC ACID 1000 MG/10ML IV SOLN
1000.0000 mg | INTRAVENOUS | Status: AC
  Administered 2018-03-16: 1000 mg via INTRAVENOUS
  Filled 2018-03-16: qty 1100

## 2018-03-16 MED ORDER — DIBUCAINE 1 % RE OINT: 1.0000 "application " | TOPICAL_OINTMENT | RECTAL | Status: DC | PRN

## 2018-03-16 MED ORDER — ONDANSETRON HCL 4 MG/2ML IJ SOLN: 4.0000 mg | INTRAMUSCULAR | Status: DC | PRN

## 2018-03-16 MED ORDER — ZOLPIDEM TARTRATE 5 MG PO TABS: 5.0000 mg | ORAL_TABLET | Freq: Every evening | ORAL | Status: DC | PRN

## 2018-03-16 MED ORDER — ACETAMINOPHEN 325 MG PO TABS: 650.0000 mg | ORAL_TABLET | ORAL | Status: DC | PRN

## 2018-03-16 MED ORDER — TETANUS-DIPHTH-ACELL PERTUSSIS 5-2.5-18.5 LF-MCG/0.5 IM SUSP: 0.5000 mL | Freq: Once | INTRAMUSCULAR | Status: DC

## 2018-03-16 MED ORDER — AMLODIPINE BESYLATE 10 MG PO TABS
10.0000 mg | ORAL_TABLET | Freq: Every day | ORAL | Status: DC
  Administered 2018-03-16 – 2018-03-18 (×3): 10 mg via ORAL
  Filled 2018-03-16 (×3): qty 1

## 2018-03-16 MED ORDER — ONDANSETRON HCL 4 MG PO TABS: 4.0000 mg | ORAL_TABLET | ORAL | Status: DC | PRN

## 2018-03-16 MED ORDER — BENZOCAINE-MENTHOL 20-0.5 % EX AERO: 1.0000 "application " | INHALATION_SPRAY | CUTANEOUS | Status: DC | PRN

## 2018-03-16 NOTE — Lactation Note (Signed)
This note was copied from a baby's chart. Lactation Consultation Note: initial visit with this mom of 36.1 Regina Foley infant now 31 hours old. Baby in nursery at this time due to mom not feeling well. BF  Brochure and late pre-term infant information given. Discussed pumping with mom- offered setup and assist but mom refused at this time- states she doesn't feel like it now. Encouraged to ask RN when she feels ready.  Reports baby did latch on during the night- reports good suck with no pain. Encouraged frequent nursing as mom feels able and pumping to promote milk supply. No questions at present. To call for assist prn  Patient Name: Regina Foley [id number] Date: 03/16/2018 Reason for consult: Initial assessment;Late-preterm 34-36.6wks   Maternal Data Formula Feeding for Exclusion: No  Feeding Feeding Type: Bottle Fed - Formula Length of feed: 15 min  LATCH Score                   Interventions    Lactation Tools Discussed/Used     Consult Status Consult Status: Follow-up Date: 03/17/18 Follow-up type: In-patient    Truddie Crumble 03/16/2018, 7:33 AM

## 2018-03-16 NOTE — Progress Notes (Signed)
Eure MD remained at bedside since delivery to control bleeding.

## 2018-03-16 NOTE — Anesthesia Postprocedure Evaluation (Signed)
Anesthesia Post Note  Patient: Regina Foley  Procedure(s) Performed: 03/15/18 AN AD HOC LABOR EPIDURAL     Patient location during evaluation: [facility] Anesthesia Type: Epidural Level of consciousness: awake and alert and oriented Pain management: satisfactory to patient Vital Signs Assessment: post-procedure vital signs reviewed and stable Respiratory status: respiratory function stable Cardiovascular status: stable Postop Assessment: no headache, no backache, epidural receding, patient able to bend at knees, no signs of nausea or vomiting and adequate PO intake Anesthetic complications: no    Last Vitals:  Vitals:   03/16/18 0631 03/16/18 0641  BP: 116/71   Pulse: (!) 116   Resp: 16   Temp:    SpO2: 98% 100%    Last Pain:  Vitals:   03/16/18 0430  TempSrc:   PainSc: 0-No pain   Pain Goal: Patients Stated Pain Goal: 3 (03/13/18 2137)               Katherina Mires

## 2018-03-16 NOTE — Evaluation (Signed)
Call to Dr. Elonda Husky re: pt's passage of a fist sized clot and bldg. Uterus remains U/1 and firm however bldg is moderate. Pt. Also reports feeling dizzy. Pt. Insisted prior to call she needed to use toilet(report of diarrhea on L/D post hemabate). Pt. Was sitting on the side of the bed. Nurse assisted. Pt. Felt dizzy. Pt. Requested to lay on floor. Pt. Assisted to bed with staff. Instructed to not get out of bed.  Vitals cycled.  Dr. Elonda Husky states "noted"  Will continue to monitor.  Pt. Remained a & o x 4 during event.

## 2018-03-17 LAB — CBC
HCT: 19 % — ABNORMAL LOW (ref 36.0–46.0)
HEMATOCRIT: 15.6 % — AB (ref 36.0–46.0)
Hemoglobin: 6.5 g/dL — CL (ref 12.0–15.0)
Hemoglobin: 5.4 g/dL — CL (ref 12.0–15.0)
MCH: 29.4 pg (ref 26.0–34.0)
MCH: 30 pg (ref 26.0–34.0)
MCHC: 34.2 g/dL (ref 30.0–36.0)
MCHC: 34.6 g/dL (ref 30.0–36.0)
MCV: 86 fL (ref 78.0–100.0)
MCV: 86.7 fL (ref 78.0–100.0)
PLATELETS: 173 10*3/uL (ref 150–400)
Platelets: 151 10*3/uL (ref 150–400)
RBC: 2.21 MIL/uL — AB (ref 3.87–5.11)
RBC: 1.8 MIL/uL — ABNORMAL LOW (ref 3.87–5.11)
RDW: 13.2 % (ref 11.5–15.5)
RDW: 13.4 % (ref 11.5–15.5)
WBC: 12.4 10*3/uL — AB (ref 4.0–10.5)
WBC: 11.3 10*3/uL — ABNORMAL HIGH (ref 4.0–10.5)

## 2018-03-17 LAB — PROTIME-INR
INR: 0.95
Prothrombin Time: 12.6 seconds (ref 11.4–15.2)

## 2018-03-17 LAB — PREPARE RBC (CROSSMATCH)

## 2018-03-17 LAB — FIBRINOGEN: FIBRINOGEN: 738 mg/dL — AB (ref 210–475)

## 2018-03-17 LAB — D-DIMER, QUANTITATIVE: D-Dimer, Quant: 0.83 ug/mL-FEU — ABNORMAL HIGH (ref 0.00–0.50)

## 2018-03-17 MED ORDER — FUROSEMIDE 10 MG/ML IJ SOLN
20.0000 mg | Freq: Once | INTRAMUSCULAR | Status: AC
  Administered 2018-03-17: 20 mg via INTRAVENOUS
  Filled 2018-03-17: qty 2

## 2018-03-17 MED ORDER — SODIUM CHLORIDE 0.9 % IV SOLN
510.0000 mg | Freq: Once | INTRAVENOUS | Status: AC
  Administered 2018-03-17: 510 mg via INTRAVENOUS
  Filled 2018-03-17: qty 17

## 2018-03-17 MED ORDER — ACETAMINOPHEN 325 MG PO TABS
650.0000 mg | ORAL_TABLET | Freq: Once | ORAL | Status: AC
Start: 2018-03-17 — End: 2018-03-17
  Administered 2018-03-17: 650 mg via ORAL
  Filled 2018-03-17: qty 2

## 2018-03-17 MED ORDER — SODIUM CHLORIDE 0.9 % IV SOLN
Freq: Once | INTRAVENOUS | Status: AC
  Administered 2018-03-17: 21:00:00 via INTRAVENOUS

## 2018-03-17 MED ORDER — SODIUM CHLORIDE 0.9% FLUSH: 3.0000 mL | INTRAVENOUS | Status: DC | PRN

## 2018-03-17 NOTE — Progress Notes (Addendum)
2116: Tylenol 650 mg po post transfusion.  2156: 1st UPRBC transfusing  2353: Lasix 20 mg administered  0016: 2nd UPRBC  transfusing  0037: Voided 200 mls clear urine  0200: 2 UPRBC transfusion completed  0600: H&H entered for 0600 AM  0633: H & H collected

## 2018-03-17 NOTE — Progress Notes (Signed)
CRITICAL VALUE ALERT  Critical Value: Hemoglobin 6.5  Date & Time Notied: 03/17/18 @0643   Provider Notified: Dr. Rip Harbour   Orders Received/Actions taken: No new orders at this time.

## 2018-03-17 NOTE — Lactation Note (Signed)
This note was copied from a baby's chart. Lactation Consultation Note  Patient Name: Regina Foley [id number] Date: 03/17/2018 Reason for consult: Follow-up assessment;Late-preterm 34-36.6wks;Other (Comment)(mom S/P PPH from yesterday )  Baby is 28 hours old  LC reviewed and updated the doc flow sheets.  As LC entered the room, baby latched and released when latch was being checked,  Nipple noted to be slanted. LC noted a large wet and medium transitional stool / changed  And assisted to latch on the same breast with added pillows/ support/ depth achieved/ swallows  Noted and increased with breast compressions. Baby fed for 15 mins.and nipple more round when abby released.  LC encouraged mom to wear shells between feedings except when sleeping to elongate the nipple / areola complex for a deeper latch. Also encouraged mom to continue to post pump both breast, save milk and feed baby back with a bottle.     Maternal Data Has patient been taught Hand Expression?: Yes  Feeding Feeding Type: Breast Fed Length of feed: 15 min(swallows, increased w / breast compressions and swallows )  LATCH Score Latch: Grasps breast easily, tongue down, lips flanged, rhythmical sucking.  Audible Swallowing: Spontaneous and intermittent  Type of Nipple: Everted at rest and after stimulation(some areola edema/ indicating shells )  Comfort (Breast/Nipple): Soft / non-tender  Hold (Positioning): Assistance needed to correctly position infant at breast and maintain latch.  LATCH Score: 9  Interventions Interventions: Breast feeding basics reviewed;Assisted with latch;Skin to skin;Breast massage;Hand express;Breast compression;Adjust position;Support pillows;Position options;Shells;DEBP  Lactation Tools Discussed/Used Tools: Shells;Pump Shell Type: Inverted Breast pump type: Double-Electric Breast Pump   Consult Status Consult Status: Follow-up Date: 03/18/18 Follow-up type:  In-patient    [place] 03/17/2018, 4:08 PM

## 2018-03-18 ENCOUNTER — Ambulatory Visit: Payer: Self-pay

## 2018-03-18 LAB — HEMOGLOBIN AND HEMATOCRIT, BLOOD
HCT: 20.7 % — ABNORMAL LOW (ref 36.0–46.0)
HEMOGLOBIN: 7.2 g/dL — AB (ref 12.0–15.0)

## 2018-03-18 MED ORDER — IBUPROFEN 800 MG PO TABS: 800.0000 mg | ORAL_TABLET | Freq: Three times a day (TID) | ORAL | 0 refills | Status: DC | PRN

## 2018-03-18 MED ORDER — AMLODIPINE BESYLATE 10 MG PO TABS: 10.0000 mg | ORAL_TABLET | Freq: Every day | ORAL | 1 refills | Status: DC

## 2018-03-18 NOTE — Discharge Summary (Signed)
Physician Discharge Summary  Patient ID: Regina Foley MRN: [id number] DOB/AGE: October 09, 1986 32 y.o.  Admit date: 03/12/2018 Discharge date: 03/18/2018  Admission Diagnoses: Pre eclapmsia with severe features  Discharge Diagnoses:  Active Problems:   Severe hypertension affecting pregnancy in third trimester  Postpartum hemorrhage Discharged Condition: good  Hospital Course: pt was admitted for observation for elevated BP, she had received her BMZ and was induced after BP continued to rise.  3 day induction resulted in vaginal delivery and she experienced a postpartum hemorrhage, treated aggressively paharmacologically.  Hemoglobin drifted down to 5,4 over the course of 2 days postpartum and she was transfused 2 units PRBC Hemoglobin stabilized and bleeding was appropriate so was discharged am PPD#3  Consults: None  Significant Diagnostic Studies: labs:   Treatments: induction delivery  Discharge Exam: Blood pressure 129/83, pulse 93, temperature 97.9 F (36.6 C), temperature source Oral, resp. rate 18, height 5\' 6"  (1.676 m), weight 215 lb (97.5 kg), last menstrual period 07/05/2017, SpO2 100 %, unknown if currently breastfeeding. General appearance: alert, cooperative and no distress GI: soft, non-tender; bowel sounds normal; no masses,  no organomegaly  Feels good reports minmal bleeding no dizziness or SOB  Disposition: Discharge disposition: 01-Home or Self Care       Discharge Instructions    Call MD for:   Complete by:  As directed    Excessive bleeding   Diet - low sodium heart healthy   Complete by:  As directed       Follow-up Information    Florian Buff, MD Follow up in 1 week(s).   Specialties:  Obstetrics and Gynecology, Radiology Contact information: [place] [id number] [phone]           Signed: Florian Buff 03/18/2018, 9:52 AM

## 2018-03-18 NOTE — Progress Notes (Signed)
Pt discharged with printed instructions. Pt verbalized an understanding. No concerns noted. Sussan Meter L Kadrian Partch, RN 

## 2018-03-18 NOTE — Discharge Instructions (Signed)
Vaginal Delivery, Care After °Refer to this sheet in the next few weeks. These instructions provide you with information about caring for yourself after vaginal delivery. Your health care provider may also give you more specific instructions. Your treatment has been planned according to current medical practices, but problems sometimes occur. Call your health care provider if you have any problems or questions. °What can I expect after the procedure? °After vaginal delivery, it is common to have: °· Some bleeding from your vagina. °· Soreness in your abdomen, your vagina, and the area of skin between your vaginal opening and your anus (perineum). °· Pelvic cramps. °· Fatigue. ° °Follow these instructions at home: °Medicines °· Take over-the-counter and prescription medicines only as told by your health care provider. °· If you were prescribed an antibiotic medicine, take it as told by your health care provider. Do not stop taking the antibiotic until it is finished. °Driving ° °· Do not drive or operate heavy machinery while taking prescription pain medicine. °· Do not drive for 24 hours if you received a sedative. °Lifestyle °· Do not drink alcohol. This is especially important if you are breastfeeding or taking medicine to relieve pain. °· Do not use tobacco products, including cigarettes, chewing tobacco, or e-cigarettes. If you need help quitting, ask your health care provider. °Eating and drinking °· Drink at least 8 eight-ounce glasses of water every day unless you are told not to by your health care provider. If you choose to breastfeed your baby, you may need to drink more water than this. °· Eat high-fiber foods every day. These foods may help prevent or relieve constipation. High-fiber foods include: °? Whole grain cereals and breads. °? Brown rice. °? Beans. °? Fresh fruits and vegetables. °Activity °· Return to your normal activities as told by your health care provider. Ask your health care provider  what activities are safe for you. °· Rest as much as possible. Try to rest or take a nap when your baby is sleeping. °· Do not lift anything that is heavier than your baby or 10 lb (4.5 kg) until your health care provider says that it is safe. °· Talk with your health care provider about when you can engage in sexual activity. This may depend on your: °? Risk of infection. °? Rate of healing. °? Comfort and desire to engage in sexual activity. °Vaginal Care °· If you have an episiotomy or a vaginal tear, check the area every day for signs of infection. Check for: °? More redness, swelling, or pain. °? More fluid or blood. °? Warmth. °? Pus or a bad smell. °· Do not use tampons or douches until your health care provider says this is safe. °· Watch for any blood clots that may pass from your vagina. These may look like clumps of dark red, brown, or black discharge. °General instructions °· Keep your perineum clean and dry as told by your health care provider. °· Wear loose, comfortable clothing. °· Wipe from front to back when you use the toilet. °· Ask your health care provider if you can shower or take a bath. If you had an episiotomy or a perineal tear during labor and delivery, your health care provider may tell you not to take baths for a certain length of time. °· Wear a bra that supports your breasts and fits you well. °· If possible, have someone help you with household activities and help care for your baby for at least a few days after   you leave the hospital. °· Keep all follow-up visits for you and your baby as told by your health care provider. This is important. °Contact a health care provider if: °· You have: °? Vaginal discharge that has a bad smell. °? Difficulty urinating. °? Pain when urinating. °? A sudden increase or decrease in the frequency of your bowel movements. °? More redness, swelling, or pain around your episiotomy or vaginal tear. °? More fluid or blood coming from your episiotomy or  vaginal tear. °? Pus or a bad smell coming from your episiotomy or vaginal tear. °? A fever. °? A rash. °? Little or no interest in activities you used to enjoy. °? Questions about caring for yourself or your baby. °· Your episiotomy or vaginal tear feels warm to the touch. °· Your episiotomy or vaginal tear is separating or does not appear to be healing. °· Your breasts are painful, hard, or turn red. °· You feel unusually sad or worried. °· You feel nauseous or you vomit. °· You pass large blood clots from your vagina. If you pass a blood clot from your vagina, save it to show to your health care provider. Do not flush blood clots down the toilet without having your health care provider look at them. °· You urinate more than usual. °· You are dizzy or light-headed. °· You have not breastfed at all and you have not had a menstrual period for 12 weeks after delivery. °· You have stopped breastfeeding and you have not had a menstrual period for 12 weeks after you stopped breastfeeding. °Get help right away if: °· You have: °? Pain that does not go away or does not get better with medicine. °? Chest pain. °? Difficulty breathing. °? Blurred vision or spots in your vision. °? Thoughts about hurting yourself or your baby. °· You develop pain in your abdomen or in one of your legs. °· You develop a severe headache. °· You faint. °· You bleed from your vagina so much that you fill two sanitary pads in one hour. °This information is not intended to replace advice given to you by your health care provider. Make sure you discuss any questions you have with your health care provider. °Document Released: 11/24/2000 Document Revised: 05/10/2016 Document Reviewed: 12/12/2015 °Elsevier Interactive Patient Education © 2018 [employer] ° °

## 2018-03-18 NOTE — Lactation Note (Signed)
This note was copied from a baby's chart. Lactation Consultation Note  Patient Name: Regina Foley Date: 03/18/2018 Reason for consult: Follow-up assessment;Late-preterm 34-36.6wks;Other (Comment)(milk is in, not engorged/ swollen lateral milk ducts ) Baby is 36 hours old weight loss 8% weight loss.  Baby recently breast fed , and mom feeding the baby EBM from the bottle as LC entered the room.  Per mom total in the bottle 60 ml, small amount  spitting.  LC reviewed LC plan with mom and recommended - feed for 15 -20 mins , supplement 30 ml  And post pump.  Mom pointed out the lateral aspect of the right breast had swollen milk ducts - Ice packs provided and  Suggested mom ice for 15 -20 mins, and then pump both breast 10 mins with massage.  DEBP is set up in the room.  Per mom has a DEBP at home.    Maternal Data    Feeding Feeding Type: (baby recently fed ) Nipple Type: Slow - flow Length of feed: 35 min(per mom )  LATCH Score                   Interventions Interventions: Breast feeding basics reviewed;Shells;DEBP  Lactation Tools Discussed/Used Tools: Shells;Pump(per mom shells are helping soreness ) Shell Type: Inverted Breast pump type: Double-Electric Breast Pump WIC Program: No Pump Review: Setup, frequency, and cleaning;Milk Storage(mom has been pumping and aware of how to use DEBP )   Consult Status Consult Status: Follow-up Date: 03/19/18 Follow-up type: In-patient    [place] 03/18/2018, 2:37 PM

## 2018-03-19 LAB — TYPE AND SCREEN
ABO/RH(D): A POS
Antibody Screen: NEGATIVE
UNIT DIVISION: 0
Unit division: 0

## 2018-03-19 LAB — BPAM RBC
BLOOD PRODUCT EXPIRATION DATE: 201904172359
Blood Product Expiration Date: 201904172359
ISSUE DATE / TIME: 201904072147
ISSUE DATE / TIME: 201904080007
UNIT TYPE AND RH: 600
Unit Type and Rh: 600

## 2018-03-22 ENCOUNTER — Ambulatory Visit (HOSPITAL_COMMUNITY): Payer: Self-pay

## 2018-03-22 ENCOUNTER — Encounter: Payer: Self-pay | Admitting: Obstetrics & Gynecology

## 2018-03-22 ENCOUNTER — Telehealth: Payer: Self-pay | Admitting: *Deleted

## 2018-03-22 ENCOUNTER — Other Ambulatory Visit: Payer: Self-pay

## 2018-03-22 NOTE — Telephone Encounter (Signed)
Pt called stating that she has been having a headache that wont go away. She has also had increased swelling in her feet and lower legs since she delivered. No c/o visual disturbance or epigastric pain. She states that she has checked her BP and it is has been running around 130/90. Advised that the swelling could just be from fluids she received during delivery. Advised that with her history of cardiac arrest and c/o headache that wont go away, [name], CNM recommends that [facility]. Pt verbalized understanding.

## 2018-03-26 ENCOUNTER — Ambulatory Visit (INDEPENDENT_AMBULATORY_CARE_PROVIDER_SITE_OTHER): Payer: Managed Care, Other (non HMO) | Admitting: Obstetrics & Gynecology

## 2018-03-26 LAB — POCT HEMOGLOBIN: Hemoglobin: 9.2 g/dL — AB (ref 12.2–16.2)

## 2018-03-26 MED ORDER — MISOPROSTOL 200 MCG PO TABS: ORAL_TABLET | ORAL | 0 refills | Status: DC

## 2018-03-26 NOTE — Progress Notes (Signed)
Pt reports that her swelling has improved. She still has a headache. She denies blurry vision or dizziness. She is currently on Amlodipine 10 mg.  Pt reports that her bleeding has increased and she passed a large clot yesterday and another over the weekend. She does have a lot of abdominal pain.

## 2018-03-26 NOTE — Progress Notes (Signed)
Patient ID: Regina Foley, female   DOB: 1986-10-11, 32 y.o.   MRN: [id number]      Chief Complaint  Patient presents with  . Blood Pressure Check      32 y.o. [id number] No LMP recorded. The current method of family planning is breast feeding.  Outpatient Encounter Medications as of 03/26/2018  Medication Sig  . amLODipine (NORVASC) 10 MG tablet Take 1 tablet (10 mg total) by mouth daily. (Patient not taking: Reported on 07/26/2018)  . ibuprofen (ADVIL,MOTRIN) 800 MG tablet Take 1 tablet (800 mg total) by mouth every 8 (eight) hours as needed. (Patient not taking: Reported on 07/26/2018)  . Prenatal Vit-Fe Fumarate-FA (MULTIVITAMIN-PRENATAL) 27-0.8 MG TABS tablet Take 1 tablet by mouth daily at 12 noon.  . misoprostol (CYTOTEC) 200 MCG tablet 1 twice daily for 3 days (Patient not taking: Reported on 04/25/2018)   No facility-administered encounter medications on file as of 03/26/2018.     Subjective Regina Foley in for pp BP check on norvasc Also had pp hemorrhage Past Medical History:  Diagnosis Date  . Cardiac arrest ([place])   . Cervical cancer ([place])   . Hypertension     Past Surgical History:  Procedure Laterality Date  . APPENDECTOMY    . CERVICAL CONE BIOPSY    . CHOLECYSTECTOMY    . LEEP    . Ovarian cyst     drained    OB History    Gravida  2   Para  2   Term  1   Preterm  1   AB      Living  2     SAB      TAB      Ectopic      Multiple  0   Live Births  2           Allergies  Allergen Reactions  . Diclegis [Doxylamine-Pyridoxine] Anaphylaxis and Swelling  . Hydrocodone Nausea And Vomiting  . Compazine [Prochlorperazine Edisylate]     Swelling of the throat. Took with metoclopramide, not sure which causes reaction.  . Nifedipine Other (See Comments)    Severe h/a  . Reglan [Metoclopramide]     Swelling of throat. Took with compazine, not sure which causes reaction.    Social History   Socioeconomic History  . Marital status: Divorced      Spouse name: Not on file  . Number of children: Not on file  . Years of education: Not on file  . Highest education level: Not on file  Occupational History  . Not on file  Social Needs  . Financial resource strain: Not on file  . Food insecurity:    Worry: Not on file    Inability: Not on file  . Transportation needs:    Medical: Not on file    Non-medical: Not on file  Tobacco Use  . Smoking status: Never Smoker  . Smokeless tobacco: Never Used  Substance and Sexual Activity  . Alcohol use: No    Frequency: Never  . Drug use: No  . Sexual activity: Yes    Birth control/protection: Pill  Lifestyle  . Physical activity:    Days per week: Not on file    Minutes per session: Not on file  . Stress: Not on file  Relationships  . Social connections:    Talks on phone: Not on file    Gets together: Not on file    Attends religious service: Not on file  Active member of club or organization: Not on file    Attends meetings of clubs or organizations: Not on file    Relationship status: Not on file  Other Topics Concern  . Not on file  Social History Narrative  . Not on file    Family History  Problem Relation Age of Onset  . Scoliosis Mother   . Heart disease Father   . Hypertension Father   . Diabetes Father   . Hyperlipidemia Father   . COPD Father   . Asthma Father     Medications:       Current Outpatient Medications:  .  amLODipine (NORVASC) 10 MG tablet, Take 1 tablet (10 mg total) by mouth daily. (Patient not taking: Reported on 07/26/2018), Disp: 30 tablet, Rfl: 1 .  ibuprofen (ADVIL,MOTRIN) 800 MG tablet, Take 1 tablet (800 mg total) by mouth every 8 (eight) hours as needed. (Patient not taking: Reported on 07/26/2018), Disp: 30 tablet, Rfl: 0 .  Prenatal Vit-Fe Fumarate-FA (MULTIVITAMIN-PRENATAL) 27-0.8 MG TABS tablet, Take 1 tablet by mouth daily at 12 noon., Disp: , Rfl:  .  amLODipine (NORVASC) 10 MG tablet, Take 1 tablet (10 mg total) by mouth  daily. (Patient not taking: Reported on 07/26/2018), Disp: 30 tablet, Rfl: 11 .  amoxicillin-clavulanate (AUGMENTIN) 875-125 MG tablet, Take 1 tablet by mouth 2 (two) times daily. (Patient not taking: Reported on 07/26/2018), Disp: 20 tablet, Rfl: 0 .  enalapril (VASOTEC) 10 MG tablet, Take 1 tablet (10 mg total) by mouth daily., Disp: 90 tablet, Rfl: 3 .  megestrol (MEGACE) 40 MG tablet, 3 tablets a day for 5 days, 2 tablets a day for 5 days then 1 tablet daily, Disp: 45 tablet, Rfl: 3 .  misoprostol (CYTOTEC) 200 MCG tablet, 1 twice daily for 3 days (Patient not taking: Reported on 04/25/2018), Disp: 6 tablet, Rfl: 0 .  norethindrone (MICRONOR,CAMILA,ERRIN) 0.35 MG tablet, Take 1 tablet (0.35 mg total) by mouth daily. Take 1 a day, Disp: 1 Package, Rfl: 11 .  triamterene-hydrochlorothiazide (DYAZIDE) 37.5-25 MG capsule, Take 1 each (1 capsule total) by mouth daily., Disp: 30 capsule, Rfl: 11  Objective Blood pressure 130/82, pulse 82, height 5\' 6"  (1.676 m), weight 194 lb 6.4 oz (88.2 kg), currently breastfeeding.    Pertinent ROS   Labs or studies     Impression Diagnoses this Encounter::   ICD-10-CM   1. Postpartum hemorrhage, unspecified type O72.1 POCT hemoglobin    Established relevant diagnosis(es):   Plan/Recommendations: Meds ordered this encounter  Medications  . misoprostol (CYTOTEC) 200 MCG tablet    Sig: 1 twice daily for 3 days    Dispense:  6 tablet    Refill:  0    Labs or Scans Ordered: Orders Placed This Encounter  Procedures  . POCT hemoglobin    Management:: cytotec 200 Bid x 3 days Continue norvasc  Follow up Return in about 4 weeks (around 04/23/2018) for with Dr Elonda Husky.       All questions were answered.

## 2018-03-27 ENCOUNTER — Telehealth: Payer: Self-pay | Admitting: *Deleted

## 2018-03-28 ENCOUNTER — Encounter (HOSPITAL_COMMUNITY): Payer: Self-pay

## 2018-03-28 ENCOUNTER — Ambulatory Visit (HOSPITAL_COMMUNITY): Payer: Managed Care, Other (non HMO)

## 2018-03-28 ENCOUNTER — Other Ambulatory Visit: Payer: Self-pay

## 2018-03-28 ENCOUNTER — Encounter: Payer: Self-pay | Admitting: Obstetrics and Gynecology

## 2018-03-28 NOTE — Telephone Encounter (Signed)
Patient is requesting an additional 2 weeks our for her recovery due to BP issues and pp hemorrhage after delivery.  Will discuss with Dr Elonda Husky and call patient back later today.

## 2018-04-01 ENCOUNTER — Encounter: Payer: Self-pay | Admitting: *Deleted

## 2018-04-01 NOTE — Telephone Encounter (Signed)
Patient informed note was signed.  She will pick up at next appointment.

## 2018-04-25 ENCOUNTER — Encounter: Payer: Self-pay | Admitting: Obstetrics & Gynecology

## 2018-04-25 ENCOUNTER — Ambulatory Visit (INDEPENDENT_AMBULATORY_CARE_PROVIDER_SITE_OTHER): Payer: Managed Care, Other (non HMO) | Admitting: Obstetrics & Gynecology

## 2018-04-25 ENCOUNTER — Other Ambulatory Visit: Payer: Self-pay | Admitting: Obstetrics & Gynecology

## 2018-04-25 ENCOUNTER — Encounter: Payer: Self-pay | Admitting: *Deleted

## 2018-04-25 MED ORDER — NORETHINDRONE 0.35 MG PO TABS: 1.0000 | ORAL_TABLET | Freq: Every day | ORAL | 11 refills | Status: DC

## 2018-04-25 MED ORDER — AMLODIPINE BESYLATE 10 MG PO TABS: 10.0000 mg | ORAL_TABLET | Freq: Every day | ORAL | 11 refills | Status: DC

## 2018-04-25 NOTE — Progress Notes (Signed)
Subjective:     Regina Foley is a 32 y.o. female who presents for a postpartum visit. She is 6 weeks postpartum following a spontaneous vaginal delivery. I have fully reviewed the prenatal and intrapartum course. The delivery was at 45 gestational weeks. Outcome: spontaneous vaginal delivery. Anesthesia: epidural. Postpartum course has been significant for hypertension and immediate postpartum hemorrhage. Baby's course has been significant for GERD and possible seizures. Baby is feeding by breast. Bleeding no bleeding. Bowel function is normal. Bladder function is normal. Patient is sexually active. Contraception method is none. Postpartum depression screening: negative.  The following portions of the patient's history were reviewed and updated as appropriate: allergies, current medications, past family history, past medical history, past social history, past surgical history and problem list.  Review of Systems Pertinent items are noted in HPI.   Objective:    BP (!) 142/80 (BP Location: Left Arm, Patient Position: Sitting, Cuff Size: Normal)   Pulse 98   Ht 5\' 6"  (1.676 m)   Wt 190 lb (86.2 kg)   Breastfeeding? Yes   BMI 30.67 kg/m   General:  alert, cooperative and no distress   Breasts:    Lungs:   Heart:    Abdomen: soft, non-tender; bowel sounds normal; no masses,  no organomegaly   Vulva:  normal  Vagina: normal vagina  Cervix:  no cervical motion tenderness and no lesions  Corpus: normal size, contour, position, consistency, mobility, non-tender  Adnexa:  normal adnexa  Rectal Exam:         Assessment:     normal postpartum exam. Pap smear not done at today's visit.   Plan:    1. Contraception: micronor 2. Continue norvasc 10 daily 3. Follow up in: 3 months or as needed.

## 2018-05-02 ENCOUNTER — Telehealth: Payer: Self-pay | Admitting: Obstetrics & Gynecology

## 2018-05-02 MED ORDER — ENALAPRIL MALEATE 10 MG PO TABS: 10.0000 mg | ORAL_TABLET | Freq: Every day | ORAL | 11 refills | Status: DC

## 2018-05-02 NOTE — Telephone Encounter (Signed)
Pt called stating that she hasnt had a period since delivery but she started one today. She asks if that is normal. Advised that it can be considering she is breastfeeding. She calls with concerns about taking Norvasc. She states that she has dealt with a headache since after delivery. She states that she stopped taking the norvasc on Sunday and has not had a headache. She states that she then took the norvasc this morning and started with a headache again. She is asking if something different can be prescribed. She also states that she had a reaction previously to nifedipine so she can not take that. Advised that I would send her request to Dr Elonda Husky and let her know. Pt verbalized understanding.

## 2018-05-02 NOTE — Telephone Encounter (Signed)
Enalapril(vasotec ordered) 10 mg daily safe in breastfeeding

## 2018-05-03 NOTE — Telephone Encounter (Signed)
Informed pt that Rx for enalapril had been sent in. Advised to call back if she had an issues after taking it. Pt verbalized understanding.

## 2018-05-23 ENCOUNTER — Other Ambulatory Visit: Payer: Self-pay | Admitting: Obstetrics & Gynecology

## 2018-05-23 ENCOUNTER — Telehealth: Payer: Self-pay | Admitting: Obstetrics & Gynecology

## 2018-05-23 MED ORDER — ENALAPRIL MALEATE 10 MG PO TABS: 10.0000 mg | ORAL_TABLET | Freq: Every day | ORAL | 3 refills | Status: DC

## 2018-05-23 NOTE — Telephone Encounter (Signed)
sent 

## 2018-05-23 NOTE — Telephone Encounter (Signed)
Pt has been getting bp meds now needs to have 90 day supply and go to different pharmacy for her ins to pay/ please call pt

## 2018-07-01 ENCOUNTER — Telehealth: Payer: Self-pay | Admitting: Obstetrics & Gynecology

## 2018-07-01 ENCOUNTER — Encounter: Payer: Self-pay | Admitting: *Deleted

## 2018-07-01 MED ORDER — AMOXICILLIN-POT CLAVULANATE 875-125 MG PO TABS: 1.0000 | ORAL_TABLET | Freq: Two times a day (BID) | ORAL | 0 refills | Status: DC

## 2018-07-01 NOTE — Telephone Encounter (Signed)
PT has sinus infection called her primary to get treated and they told her since she was breat feeding she would have to call us . Please advise if pt needs to be seen or if something can be called in for pt/ [facility])

## 2018-07-01 NOTE — Telephone Encounter (Signed)
Patient is experiencing sinus pressure and head congestion with green, bloody snot. PCP would not treat because she is still breastfeeding. Please advise.

## 2018-07-01 NOTE — Telephone Encounter (Signed)
Meds ordered this encounter  Medications  . amoxicillin-clavulanate (AUGMENTIN) 875-125 MG tablet    Sig: Take 1 tablet by mouth 2 (two) times daily.    Dispense:  20 tablet    Refill:  0   

## 2018-07-01 NOTE — Telephone Encounter (Signed)
augmentin is e prescribed

## 2018-07-26 ENCOUNTER — Ambulatory Visit (INDEPENDENT_AMBULATORY_CARE_PROVIDER_SITE_OTHER): Payer: Managed Care, Other (non HMO) | Admitting: Obstetrics & Gynecology

## 2018-07-26 ENCOUNTER — Encounter: Payer: Self-pay | Admitting: Obstetrics & Gynecology

## 2018-07-26 ENCOUNTER — Ambulatory Visit: Payer: Managed Care, Other (non HMO) | Admitting: Obstetrics & Gynecology

## 2018-07-26 VITALS — BP 159/109 | HR 86 | Ht 66.0 in | Wt 192.0 lb

## 2018-07-26 DIAGNOSIS — I10 Essential (primary) hypertension: Secondary | ICD-10-CM

## 2018-07-26 DIAGNOSIS — N938 Other specified abnormal uterine and vaginal bleeding: Secondary | ICD-10-CM | POA: Diagnosis not present

## 2018-07-26 MED ORDER — TRIAMTERENE-HCTZ 37.5-25 MG PO CAPS: 1.0000 | ORAL_CAPSULE | Freq: Every day | ORAL | 11 refills | Status: DC

## 2018-07-26 MED ORDER — MEGESTROL ACETATE 40 MG PO TABS: ORAL_TABLET | ORAL | 3 refills | Status: DC

## 2018-07-26 NOTE — Progress Notes (Signed)
Chief Complaint  Patient presents with  . discuss bleeding      32 y.o. [id number] Patient's last menstrual period was 07/01/2018. The current method of family planning is oral progesterone-only contraceptive.  Outpatient Encounter Medications as of 07/26/2018  Medication Sig  . enalapril (VASOTEC) 10 MG tablet Take 1 tablet (10 mg total) by mouth daily.  . norethindrone (MICRONOR,CAMILA,ERRIN) 0.35 MG tablet Take 1 tablet (0.35 mg total) by mouth daily. Take 1 a day  . Prenatal Vit-Fe Fumarate-FA (MULTIVITAMIN-PRENATAL) 27-0.8 MG TABS tablet Take 1 tablet by mouth daily at 12 noon.  Marland Kitchen amLODipine (NORVASC) 10 MG tablet Take 1 tablet (10 mg total) by mouth daily. (Patient not taking: Reported on 07/26/2018)  . amLODipine (NORVASC) 10 MG tablet Take 1 tablet (10 mg total) by mouth daily. (Patient not taking: Reported on 07/26/2018)  . amoxicillin-clavulanate (AUGMENTIN) 875-125 MG tablet Take 1 tablet by mouth 2 (two) times daily. (Patient not taking: Reported on 07/26/2018)  . ibuprofen (ADVIL,MOTRIN) 800 MG tablet Take 1 tablet (800 mg total) by mouth every 8 (eight) hours as needed. (Patient not taking: Reported on 07/26/2018)  . megestrol (MEGACE) 40 MG tablet 3 tablets a day for 5 days, 2 tablets a day for 5 days then 1 tablet daily  . misoprostol (CYTOTEC) 200 MCG tablet 1 twice daily for 3 days (Patient not taking: Reported on 04/25/2018)  . triamterene-hydrochlorothiazide (DYAZIDE) 37.5-25 MG capsule Take 1 each (1 capsule total) by mouth daily.   No facility-administered encounter medications on file as of 07/26/2018.     Subjective Regina Foley comes in with DUB on micronor, 16 days out of the last 30 sometimes spotting sometimes regular bleeding Some cramping Also with lower abdominal pain with movement Past Medical History:  Diagnosis Date  . Cardiac arrest ([place])   . Cervical cancer ([place])   . Hypertension     Past Surgical History:  Procedure Laterality Date  .  APPENDECTOMY    . CERVICAL CONE BIOPSY    . CHOLECYSTECTOMY    . LEEP    . Ovarian cyst     drained    OB History    Gravida  2   Para  2   Term  1   Preterm  1   AB      Living  2     SAB      TAB      Ectopic      Multiple  0   Live Births  2           Allergies  Allergen Reactions  . Diclegis [Doxylamine-Pyridoxine] Anaphylaxis and Swelling  . Hydrocodone Nausea And Vomiting  . Compazine [Prochlorperazine Edisylate]     Swelling of the throat. Took with metoclopramide, not sure which causes reaction.  . Nifedipine Other (See Comments)    Severe h/a  . Reglan [Metoclopramide]     Swelling of throat. Took with compazine, not sure which causes reaction.    Social History   Socioeconomic History  . Marital status: Divorced    Spouse name: Not on file  . Number of children: Not on file  . Years of education: Not on file  . Highest education level: Not on file  Occupational History  . Not on file  Social Needs  . Financial resource strain: Not on file  . Food insecurity:    Worry: Not on file    Inability: Not on file  . Transportation needs:  Medical: Not on file    Non-medical: Not on file  Tobacco Use  . Smoking status: Never Smoker  . Smokeless tobacco: Never Used  Substance and Sexual Activity  . Alcohol use: No    Frequency: Never  . Drug use: No  . Sexual activity: Yes    Birth control/protection: Pill  Lifestyle  . Physical activity:    Days per week: Not on file    Minutes per session: Not on file  . Stress: Not on file  Relationships  . Social connections:    Talks on phone: Not on file    Gets together: Not on file    Attends religious service: Not on file    Active member of club or organization: Not on file    Attends meetings of clubs or organizations: Not on file    Relationship status: Not on file  Other Topics Concern  . Not on file  Social History Narrative  . Not on file    Family History  Problem  Relation Age of Onset  . Scoliosis Mother   . Heart disease Father   . Hypertension Father   . Diabetes Father   . Hyperlipidemia Father   . COPD Father   . Asthma Father     Medications:       Current Outpatient Medications:  .  enalapril (VASOTEC) 10 MG tablet, Take 1 tablet (10 mg total) by mouth daily., Disp: 90 tablet, Rfl: 3 .  norethindrone (MICRONOR,CAMILA,ERRIN) 0.35 MG tablet, Take 1 tablet (0.35 mg total) by mouth daily. Take 1 a day, Disp: 1 Package, Rfl: 11 .  Prenatal Vit-Fe Fumarate-FA (MULTIVITAMIN-PRENATAL) 27-0.8 MG TABS tablet, Take 1 tablet by mouth daily at 12 noon., Disp: , Rfl:  .  amLODipine (NORVASC) 10 MG tablet, Take 1 tablet (10 mg total) by mouth daily. (Patient not taking: Reported on 07/26/2018), Disp: 30 tablet, Rfl: 1 .  amLODipine (NORVASC) 10 MG tablet, Take 1 tablet (10 mg total) by mouth daily. (Patient not taking: Reported on 07/26/2018), Disp: 30 tablet, Rfl: 11 .  amoxicillin-clavulanate (AUGMENTIN) 875-125 MG tablet, Take 1 tablet by mouth 2 (two) times daily. (Patient not taking: Reported on 07/26/2018), Disp: 20 tablet, Rfl: 0 .  ibuprofen (ADVIL,MOTRIN) 800 MG tablet, Take 1 tablet (800 mg total) by mouth every 8 (eight) hours as needed. (Patient not taking: Reported on 07/26/2018), Disp: 30 tablet, Rfl: 0 .  megestrol (MEGACE) 40 MG tablet, 3 tablets a day for 5 days, 2 tablets a day for 5 days then 1 tablet daily, Disp: 45 tablet, Rfl: 3 .  misoprostol (CYTOTEC) 200 MCG tablet, 1 twice daily for 3 days (Patient not taking: Reported on 04/25/2018), Disp: 6 tablet, Rfl: 0 .  triamterene-hydrochlorothiazide (DYAZIDE) 37.5-25 MG capsule, Take 1 each (1 capsule total) by mouth daily., Disp: 30 capsule, Rfl: 11  Objective Blood pressure (!) 159/109, pulse 86, height 5\' 6"  (1.676 m), weight 192 lb (87.1 kg), last menstrual period 07/01/2018, currently breastfeeding.  Gen WDWN NAD  Pertinent ROS No burning with urination, frequency or urgency No nausea,  vomiting or diarrhea Nor fever chills or other constitutional symptoms   Labs or studies     Impression Diagnoses this Encounter::   ICD-10-CM   1. DUB (dysfunctional uterine bleeding) N93.8   2. Essential hypertension I10     Established relevant diagnosis(es):   Plan/Recommendations: Meds ordered this encounter  Medications  . triamterene-hydrochlorothiazide (DYAZIDE) 37.5-25 MG capsule    Sig: Take 1 each (1  capsule total) by mouth daily.    Dispense:  30 capsule    Refill:  11  . megestrol (MEGACE) 40 MG tablet    Sig: 3 tablets a day for 5 days, 2 tablets a day for 5 days then 1 tablet daily    Dispense:  45 tablet    Refill:  3    Labs or Scans Ordered: No orders of the defined types were placed in this encounter.   Management:: meagce for 1 month then a week off, restart micronor Add 1/2 dyazide tablet to her vasotec, breastfeeding is well extablished, pt aware script will say 1 tablet daily, but take 1/2 tablet per day  Follow up Return in about 6 weeks (around 09/06/2018).        Face to face time:  15 minutes  Greater than 50% of the visit time was spent in counseling and coordination of care with the patient.  The summary and outline of the counseling and care coordination is summarized in the note above.   All questions were answered.

## 2018-08-13 ENCOUNTER — Other Ambulatory Visit: Payer: Self-pay | Admitting: Obstetrics & Gynecology

## 2018-08-13 MED ORDER — AMOXICILLIN-POT CLAVULANATE 875-125 MG PO TABS: 1.0000 | ORAL_TABLET | Freq: Two times a day (BID) | ORAL | 0 refills | Status: DC

## 2018-09-06 ENCOUNTER — Ambulatory Visit (INDEPENDENT_AMBULATORY_CARE_PROVIDER_SITE_OTHER): Payer: Managed Care, Other (non HMO) | Admitting: Obstetrics & Gynecology

## 2018-09-06 ENCOUNTER — Other Ambulatory Visit: Payer: Self-pay

## 2018-09-06 ENCOUNTER — Encounter: Payer: Self-pay | Admitting: Obstetrics & Gynecology

## 2018-09-06 VITALS — BP 139/101 | HR 97 | Ht 66.0 in | Wt 193.0 lb

## 2018-09-06 DIAGNOSIS — I10 Essential (primary) hypertension: Secondary | ICD-10-CM

## 2018-09-06 DIAGNOSIS — N938 Other specified abnormal uterine and vaginal bleeding: Secondary | ICD-10-CM

## 2018-09-06 DIAGNOSIS — R1030 Lower abdominal pain, unspecified: Secondary | ICD-10-CM | POA: Diagnosis not present

## 2018-09-06 MED ORDER — ENALAPRIL MALEATE 20 MG PO TABS: 20.0000 mg | ORAL_TABLET | Freq: Every day | ORAL | 11 refills | Status: DC

## 2018-09-06 NOTE — Progress Notes (Signed)
Chief Complaint  Patient presents with  . Follow-up      32 y.o. [id number] Patient's last menstrual period was 08/24/2018. The current method of family planning is oral progesterone-only contraceptive.  Outpatient Encounter Medications as of 09/06/2018  Medication Sig  . enalapril (VASOTEC) 20 MG tablet Take 1 tablet (20 mg total) by mouth daily.  . norethindrone (MICRONOR,CAMILA,ERRIN) 0.35 MG tablet Take 1 tablet (0.35 mg total) by mouth daily. Take 1 a day  . Prenatal Vit-Fe Fumarate-FA (MULTIVITAMIN-PRENATAL) 27-0.8 MG TABS tablet Take 1 tablet by mouth daily at 12 noon.  . triamterene-hydrochlorothiazide (DYAZIDE) 37.5-25 MG capsule Take 1 each (1 capsule total) by mouth daily.  . [DISCONTINUED] enalapril (VASOTEC) 10 MG tablet Take 1 tablet (10 mg total) by mouth daily.  . megestrol (MEGACE) 40 MG tablet 3 tablets a day for 5 days, 2 tablets a day for 5 days then 1 tablet daily (Patient not taking: Reported on 09/06/2018)  . [DISCONTINUED] amLODipine (NORVASC) 10 MG tablet Take 1 tablet (10 mg total) by mouth daily. (Patient not taking: Reported on 07/26/2018)  . [DISCONTINUED] amLODipine (NORVASC) 10 MG tablet Take 1 tablet (10 mg total) by mouth daily. (Patient not taking: Reported on 07/26/2018)  . [DISCONTINUED] amoxicillin-clavulanate (AUGMENTIN) 875-125 MG tablet Take 1 tablet by mouth 2 (two) times daily.  . [DISCONTINUED] ibuprofen (ADVIL,MOTRIN) 800 MG tablet Take 1 tablet (800 mg total) by mouth every 8 (eight) hours as needed. (Patient not taking: Reported on 07/26/2018)  . [DISCONTINUED] misoprostol (CYTOTEC) 200 MCG tablet 1 twice daily for 3 days (Patient not taking: Reported on 04/25/2018)   No facility-administered encounter medications on file as of 09/06/2018.     Subjective Pt is breastfeeding  Trying to manage her BP, on vasotec 10 mg + dyazide 37.5/25 BP have been running 140/90s asymptomatic Still with the point tenderness area just to the left of midline  in the rectus/pubic insertion moderate worse with twists turn etc Past Medical History:  Diagnosis Date  . Cardiac arrest ([place])   . Cervical cancer ([place])   . Hypertension     Past Surgical History:  Procedure Laterality Date  . APPENDECTOMY    . CERVICAL CONE BIOPSY    . CHOLECYSTECTOMY    . LEEP    . Ovarian cyst     drained    OB History    Gravida  2   Para  2   Term  1   Preterm  1   AB      Living  2     SAB      TAB      Ectopic      Multiple  0   Live Births  2           Allergies  Allergen Reactions  . Diclegis [Doxylamine-Pyridoxine] Anaphylaxis and Swelling  . Hydrocodone Nausea And Vomiting  . Compazine [Prochlorperazine Edisylate]     Swelling of the throat. Took with metoclopramide, not sure which causes reaction.  . Nifedipine Other (See Comments)    Severe h/a  . Reglan [Metoclopramide]     Swelling of throat. Took with compazine, not sure which causes reaction.    Social History   Socioeconomic History  . Marital status: Divorced    Spouse name: Not on file  . Number of children: Not on file  . Years of education: Not on file  . Highest education level: Not on file  Occupational History  . Not on  file  Social Needs  . Financial resource strain: Not on file  . Food insecurity:    Worry: Not on file    Inability: Not on file  . Transportation needs:    Medical: Not on file    Non-medical: Not on file  Tobacco Use  . Smoking status: Never Smoker  . Smokeless tobacco: Never Used  Substance and Sexual Activity  . Alcohol use: No    Frequency: Never  . Drug use: No  . Sexual activity: Yes    Birth control/protection: Pill  Lifestyle  . Physical activity:    Days per week: Not on file    Minutes per session: Not on file  . Stress: Not on file  Relationships  . Social connections:    Talks on phone: Not on file    Gets together: Not on file    Attends religious service: Not on file    Active member of club or  organization: Not on file    Attends meetings of clubs or organizations: Not on file    Relationship status: Not on file  Other Topics Concern  . Not on file  Social History Narrative  . Not on file    Family History  Problem Relation Age of Onset  . Scoliosis Mother   . Heart disease Father   . Hypertension Father   . Diabetes Father   . Hyperlipidemia Father   . COPD Father   . Asthma Father   . Diabetes Brother   . Diabetes Sister     Medications:       Current Outpatient Medications:  .  enalapril (VASOTEC) 20 MG tablet, Take 1 tablet (20 mg total) by mouth daily., Disp: 30 tablet, Rfl: 11 .  norethindrone (MICRONOR,CAMILA,ERRIN) 0.35 MG tablet, Take 1 tablet (0.35 mg total) by mouth daily. Take 1 a day, Disp: 1 Package, Rfl: 11 .  Prenatal Vit-Fe Fumarate-FA (MULTIVITAMIN-PRENATAL) 27-0.8 MG TABS tablet, Take 1 tablet by mouth daily at 12 noon., Disp: , Rfl:  .  triamterene-hydrochlorothiazide (DYAZIDE) 37.5-25 MG capsule, Take 1 each (1 capsule total) by mouth daily., Disp: 30 capsule, Rfl: 11 .  megestrol (MEGACE) 40 MG tablet, 3 tablets a day for 5 days, 2 tablets a day for 5 days then 1 tablet daily (Patient not taking: Reported on 09/06/2018), Disp: 45 tablet, Rfl: 3  Objective Blood pressure (!) 139/101, pulse 97, height 5\' 6"  (1.676 m), weight 193 lb (87.5 kg), last menstrual period 08/24/2018, currently breastfeeding.  Gen WDWN NAD  Pertinent ROS No burning with urination, frequency or urgency No nausea, vomiting or diarrhea Nor fever chills or other constitutional symptoms   Labs or studies     Impression Diagnoses this Encounter::   ICD-10-CM   1. Essential hypertension I10   2. DUB (dysfunctional uterine bleeding) N93.8   3. Abdominal wall pain in suprapubic region R10.30     Established relevant diagnosis(es):   Plan/Recommendations: Meds ordered this encounter  Medications  . enalapril (VASOTEC) 20 MG tablet    Sig: Take 1 tablet (20 mg  total) by mouth daily.    Dispense:  30 tablet    Refill:  11    Labs or Scans Ordered: No orders of the defined types were placed in this encounter.   Management:: >increase enalapril to 20 mg daily >continue dyazide >begin topical lidoderm patches, if unsuccessful, will do series of injections, pt will contact via my chart  Follow up Return in about 3 months (around 12/06/2018)  for Follow up, with Dr Elonda Husky.     Face to face time:  15 minutes  Greater than 50% of the visit time was spent in counseling and coordination of care with the patient.  The summary and outline of the counseling and care coordination is summarized in the note above.   All questions were answered.   All questions were answered.

## 2018-09-17 ENCOUNTER — Other Ambulatory Visit: Payer: Self-pay | Admitting: Obstetrics & Gynecology

## 2018-09-17 DIAGNOSIS — Z136 Encounter for screening for cardiovascular disorders: Secondary | ICD-10-CM

## 2018-09-25 DIAGNOSIS — Z029 Encounter for administrative examinations, unspecified: Secondary | ICD-10-CM

## 2018-10-11 ENCOUNTER — Other Ambulatory Visit: Payer: Self-pay | Admitting: Obstetrics & Gynecology

## 2018-10-12 LAB — B12 AND FOLATE PANEL
FOLATE: 20.3 ng/mL
VITAMIN B 12: 551 pg/mL (ref 200–1100)

## 2018-10-18 ENCOUNTER — Other Ambulatory Visit: Payer: Self-pay | Admitting: Obstetrics & Gynecology

## 2018-10-18 MED ORDER — DICLOXACILLIN SODIUM 500 MG PO CAPS: 500.0000 mg | ORAL_CAPSULE | Freq: Four times a day (QID) | ORAL | 0 refills | Status: DC

## 2018-10-25 ENCOUNTER — Other Ambulatory Visit: Payer: Self-pay | Admitting: Obstetrics & Gynecology

## 2018-11-29 ENCOUNTER — Ambulatory Visit: Payer: Managed Care, Other (non HMO) | Admitting: Obstetrics & Gynecology

## 2018-12-11 NOTE — L&D Delivery Note (Addendum)
Delivery Note  Regina Foley is a 33 y.o. [id number] s/p SVD at [redacted]w[redacted]d. She was admitted for pre-term labor and cHTN with superimposed pre eclampsia with severe features.   ROM: 0h 73m with clear fluid GBS Status: negative   Maximum Maternal Temperature: 66 F  Labor Progress: Patient presented to L&D pre-term labor and cHTN with superimposed pre eclampsia with severe features. Patient arrived at 6cm dilation. She progressed spontaneously to fully dilated with a bulging bag. On admission started on penicillin and magensium and given dose of betamethasone. After becoming fully dilated patient had cessation of contractions and remained fully dilated for approximately 15 hours without further progress. Initial plan at that point to allow patient to become betamethasone complete as well as reach 34 weeks. Subsequently she had SROM during an episode of vomiting and decision made to proceed to delivery, had quick delivery after 12 minute second stage.   Delivery Date/Time: 10/08/19 at 1816 Delivery: Once appropriate staff had been called to room including Neonatologist patient began to push. Head delivered  ROA. No nuchal cord present. Shoulder and body delivered in usual fashion. Infant with spontaneous cry, placed on mother's abdomen, dried and stimulated. Cord clamped x 2 after 1-minute delay, and cut by father. Cord blood drawn. Placenta delivered spontaneously with gentle cord traction. Fundus firm with massage and Pitocin. Labia, perineum, vagina, and cervix inspected inspected with  no lacerations appreciated. Newborn required PPV with improvement of apgars. Given history of two prior [place] patient had been given TXA prophylactically prior to the start of pushing. After delivery for ongoing trickling she was also given 462mcg buccal misoprostol with good effect and no further bleeding.   Baby Weight:  2850 grams Placenta: Sent to path Complications: None Lacerations: None  EBL: 300 Analgesia:  Epidural  Infant: APGAR (1 MIN): 4   APGAR (5 MINS): 7    Maxie Better, MD, PGY1  Center for [facility], [facility] 10/08/2019, 6:52 PM   OB FELLOW ATTESTATION  I was present, gloved, and supervising throughout the delivery and have edited the above note to reflect any changes.   Augustin Coupe, MD/MPH OB Fellow  10/08/2019, 8:25 PM

## 2018-12-12 IMAGING — US US FETAL BPP W/ NON-STRESS
1 series · 13 of 13 positions shown · non-contrast
Comparison: none

[Series 1: us fetal bpp w/nonstress · 13 acquisitions, 13 frames shown]
[im 1/13]
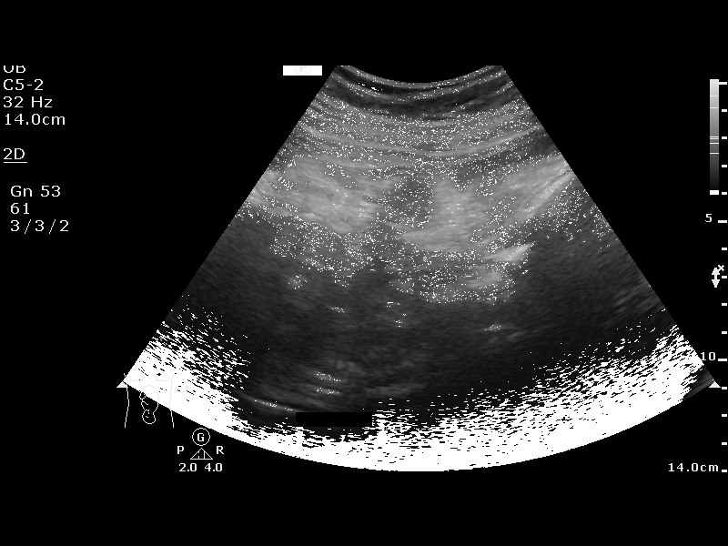
[im 2/13]
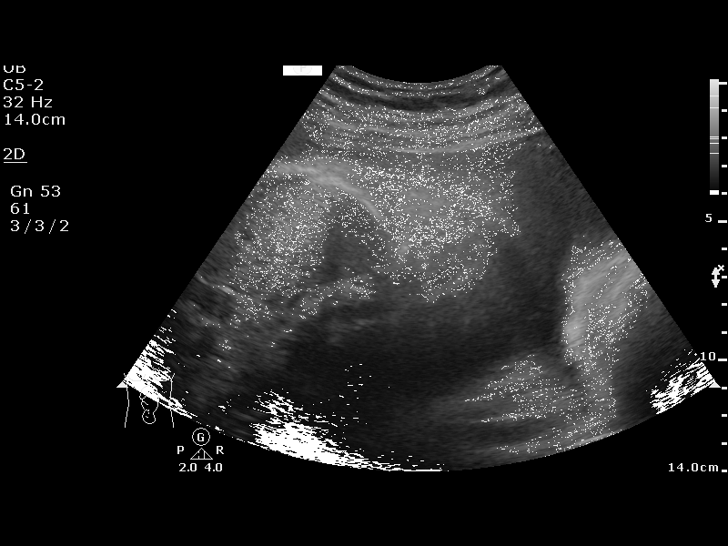
[im 3/13]
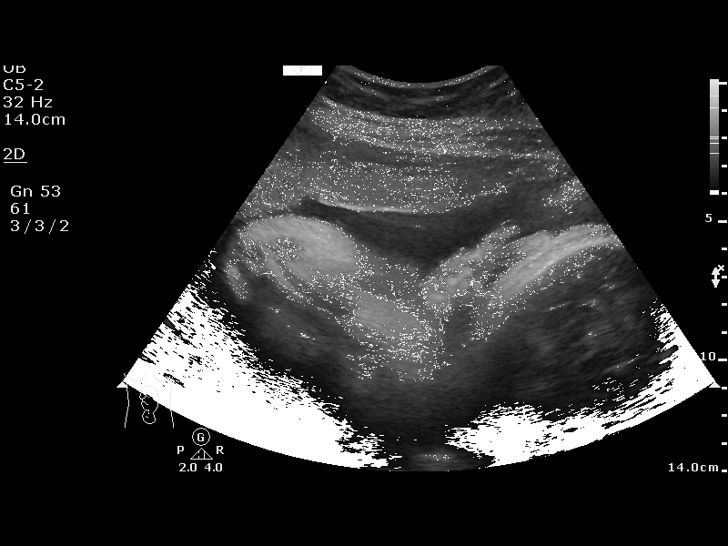
[im 4/13]
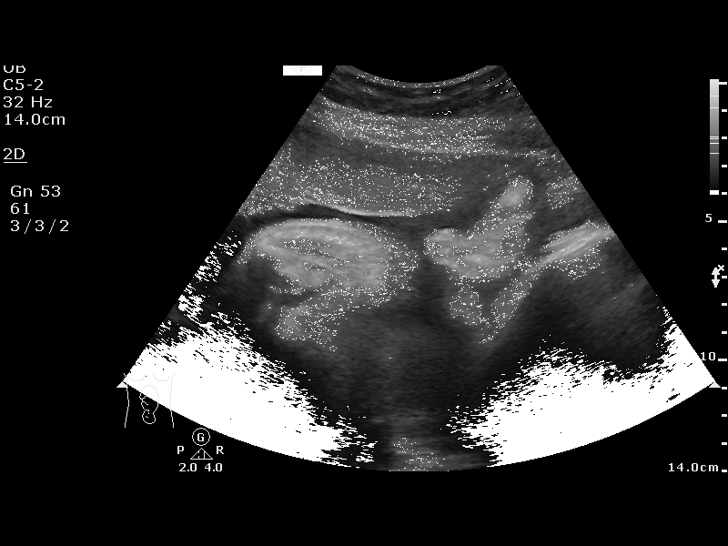
[im 5/13]
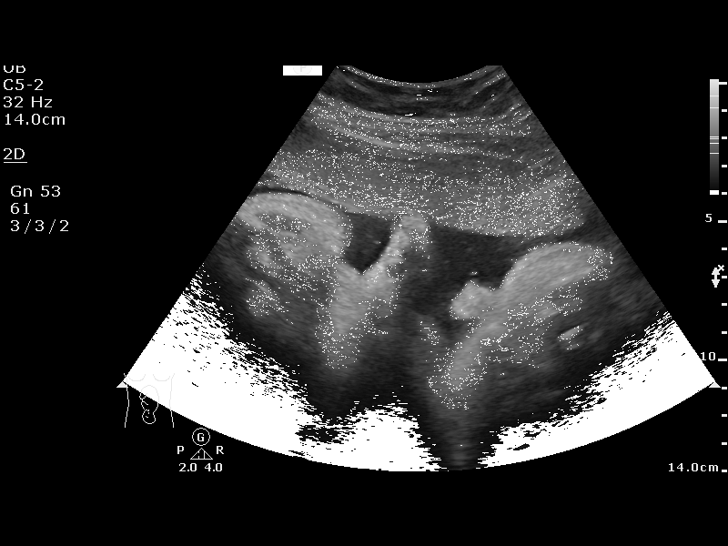
[im 6/13]
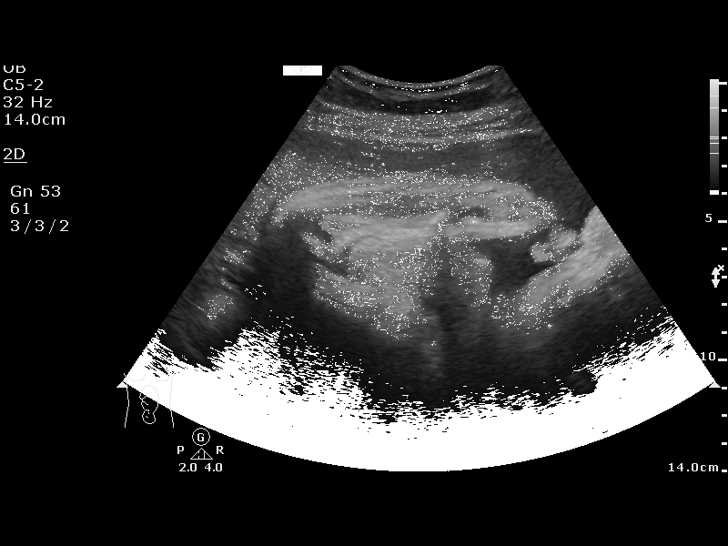
[im 7/13]
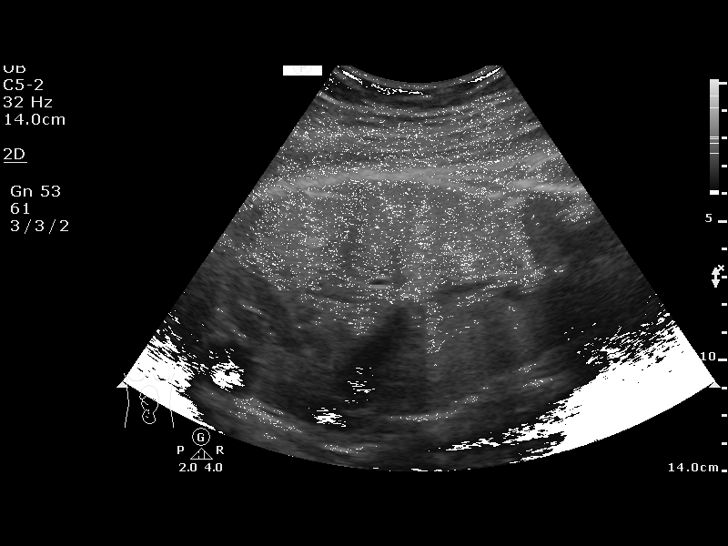
[im 8/13]
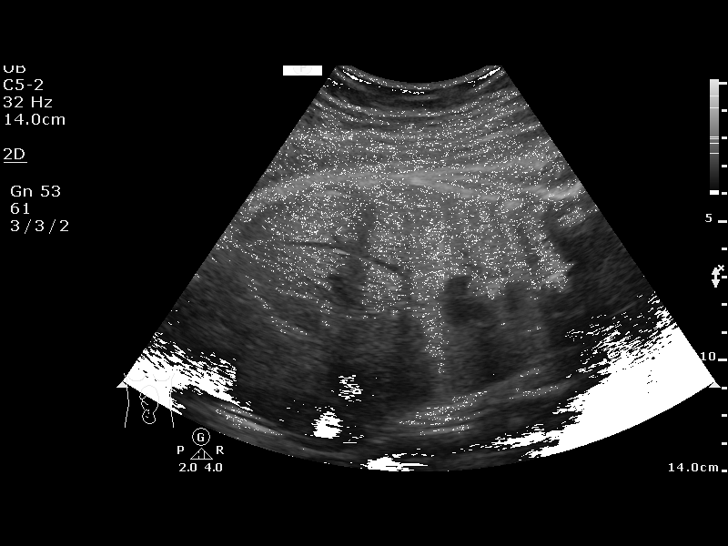
[im 9/13]
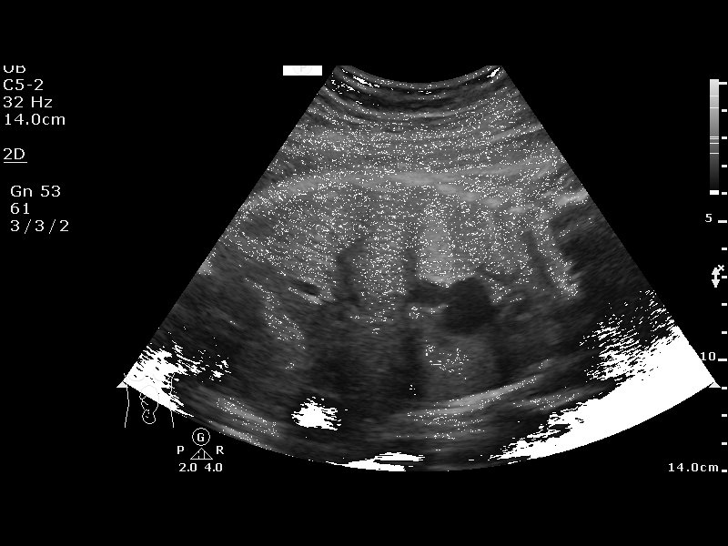
[im 10/13]
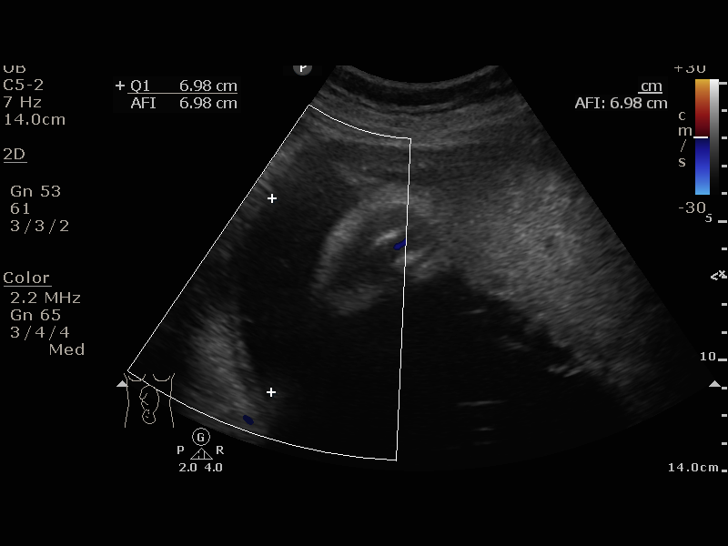
[im 11/13]
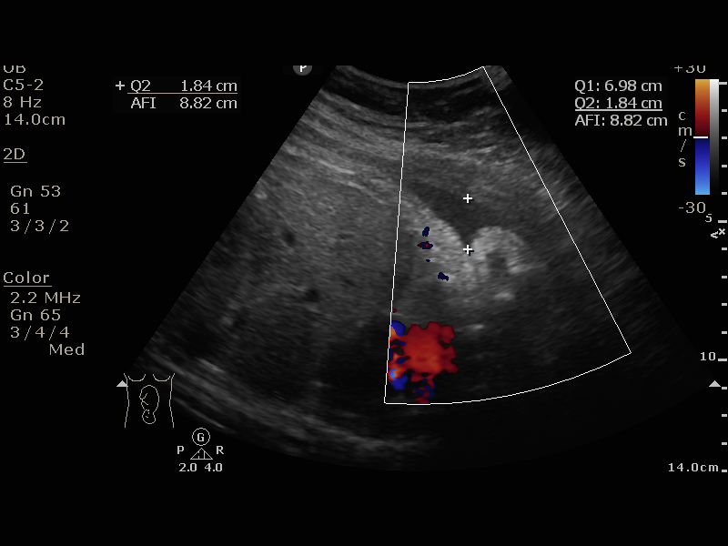
[im 12/13]
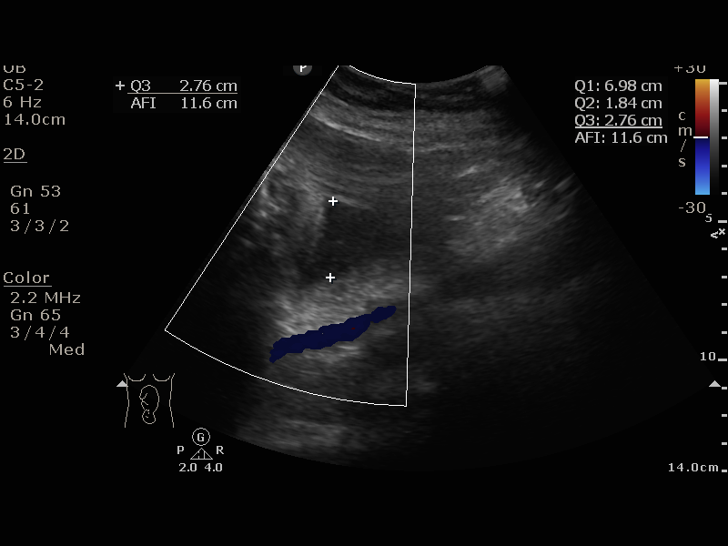
[im 13/13]
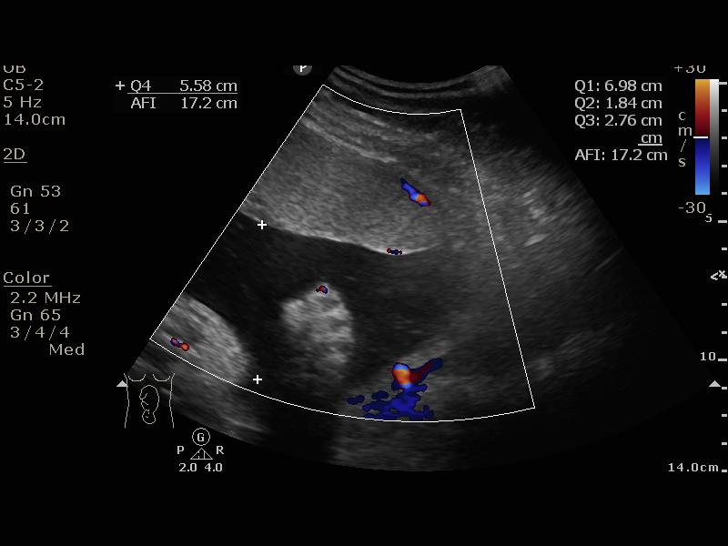

[13 of 13 positions shown; findings below may reference images not displayed]

Women's
[REDACTED]

1  US FETAL BPP W/NONSTRESS                    76818.4

1  GZI ERDNA            [id number]      [id number]     [id number]
Service(s) Provided

Indications

35 weeks gestation of pregnancy
Unspecified pre-existing hypertension
complicating pregnancy, third trimester
OB History

Blood Type:            Height:  5'6"   Weight (lb):  199       BMI:
Gravidity:    2         Term:   1
Living:       1
Fetal Evaluation

Num Of Fetuses:     1
Preg. Location:     Intrauterine
Cardiac Activity:   Observed
Presentation:       Cephalic

Amniotic Fluid
AFI FV:      Subjectively within normal limits

AFI Sum(cm)     %Tile       Largest Pocket(cm)
17.16           63

RUQ(cm)       RLQ(cm)       LUQ(cm)        LLQ(cm)
6.98
Biophysical Evaluation
Amniotic F.V:   Pocket => 2 cm two         F. Tone:        Observed
planes
F. Movement:    Observed                   N.S.T:          Reactive
F. Breathing:   Observed                   Score:          [DATE]
Gestational Age

LMP:           35w 1d        Date:  07/05/17                 EDD:   04/11/18
Best:          35w 1d     Det. By:  LMP  (07/05/17)          EDD:   04/11/18
Impression

Vertex presentation
Recommendations

Continue with antenatal testing as clinical indicated

## 2019-02-17 ENCOUNTER — Other Ambulatory Visit: Payer: Self-pay | Admitting: Obstetrics & Gynecology

## 2019-02-17 ENCOUNTER — Telehealth: Payer: Self-pay | Admitting: Obstetrics & Gynecology

## 2019-02-17 ENCOUNTER — Telehealth: Payer: Self-pay | Admitting: *Deleted

## 2019-02-17 MED ORDER — DESOGESTREL-ETHINYL ESTRADIOL 0.15-0.02/0.01 MG (21/5) PO TABS: 1.0000 | ORAL_TABLET | Freq: Every day | ORAL | 11 refills | Status: DC

## 2019-02-17 NOTE — Telephone Encounter (Signed)
Pt currently on Micronor, is planning to stop breastfeeding in the next few weeks. Would like ocp's called to pharmacy.

## 2019-02-17 NOTE — Telephone Encounter (Signed)
error 

## 2019-02-17 NOTE — Telephone Encounter (Signed)
On the last pack of pills going to stop breast feeding at end of this pack an needs something else call in/ [facility])

## 2019-03-07 ENCOUNTER — Other Ambulatory Visit: Payer: Self-pay | Admitting: Obstetrics & Gynecology

## 2019-04-07 ENCOUNTER — Telehealth: Payer: Self-pay | Admitting: *Deleted

## 2019-04-07 ENCOUNTER — Telehealth: Payer: Self-pay | Admitting: Obstetrics & Gynecology

## 2019-04-07 ENCOUNTER — Encounter: Payer: Self-pay | Admitting: *Deleted

## 2019-04-07 NOTE — Telephone Encounter (Signed)
Please call pt she needs to talk to someone about having a + preg test. Has been breast feeding and started birth control. Just needs to talk to someone

## 2019-04-07 NOTE — Telephone Encounter (Signed)
Patient called stating she switched her BCP recently after she stopped breastfeeding.  She has also changed her BP medication and started a workout regimen but has just not felt good.  She decided to take a pregnancy test which turned up positive.  She is very concerned as she was high risk with her last pregnancy and has no idea how far along she could be.  Informed patient I could get her scheduled for a virtual visit with Anderson Malta tomorrow to get her scheduled for an ultrasound and to review her medications. Pt agreeable to plan and scheduled.  Pt requested note be sent to Dr Elonda Husky as well.

## 2019-04-08 ENCOUNTER — Encounter: Payer: Self-pay | Admitting: Adult Health

## 2019-04-08 ENCOUNTER — Other Ambulatory Visit: Payer: Self-pay

## 2019-04-08 ENCOUNTER — Ambulatory Visit (INDEPENDENT_AMBULATORY_CARE_PROVIDER_SITE_OTHER): Payer: 59 | Admitting: Adult Health

## 2019-04-08 VITALS — Ht 67.0 in | Wt 199.0 lb

## 2019-04-08 DIAGNOSIS — Z8759 Personal history of other complications of pregnancy, childbirth and the puerperium: Secondary | ICD-10-CM | POA: Insufficient documentation

## 2019-04-08 DIAGNOSIS — Z3201 Encounter for pregnancy test, result positive: Secondary | ICD-10-CM | POA: Insufficient documentation

## 2019-04-08 DIAGNOSIS — O09299 Supervision of pregnancy with other poor reproductive or obstetric history, unspecified trimester: Secondary | ICD-10-CM | POA: Insufficient documentation

## 2019-04-08 DIAGNOSIS — O3680X0 Pregnancy with inconclusive fetal viability, not applicable or unspecified: Secondary | ICD-10-CM | POA: Insufficient documentation

## 2019-04-08 DIAGNOSIS — I10 Essential (primary) hypertension: Secondary | ICD-10-CM | POA: Insufficient documentation

## 2019-04-08 DIAGNOSIS — O10919 Unspecified pre-existing hypertension complicating pregnancy, unspecified trimester: Secondary | ICD-10-CM

## 2019-04-08 DIAGNOSIS — Z3A01 Less than 8 weeks gestation of pregnancy: Secondary | ICD-10-CM | POA: Insufficient documentation

## 2019-04-08 MED ORDER — PROMETHAZINE HCL 25 MG PO TABS: 25.0000 mg | ORAL_TABLET | Freq: Four times a day (QID) | ORAL | 1 refills | Status: DC | PRN

## 2019-04-08 MED ORDER — LABETALOL HCL 100 MG PO TABS: 200.0000 mg | ORAL_TABLET | Freq: Two times a day (BID) | ORAL | 6 refills | Status: DC

## 2019-04-08 NOTE — Progress Notes (Signed)
Patient ID: Regina Foley, female   DOB: 1986/10/30, 33 y.o.   MRN: [id number]   TELEHEALTH VIRTUAL GYNECOLOGY VISIT ENCOUNTER NOTE  I connected with Lecia Farve on 04/08/19 at  9:15 AM EDT by telephone at home and verified that I am speaking with the correct person using two identifiers.   I discussed the limitations, risks, security and privacy concerns of performing an evaluation and management service by telephone and the availability of in person appointments. I also discussed with the patient that there may be a patient responsible charge related to this service. The patient expressed understanding and agreed to proceed.   History:  Fran Neiswonger is a 33 y.o. [phone] female being evaluated today for having no period and +HPTs, had been breastfeeding and changed from POP to Marvin. She has 31 year old at home is worried that since had eclampsia and postpartum hemorrhage this will not be a good pregnancy. She has nausea and is tired and some cramping. She denies any abnormal vaginal discharge, bleeding, pelvic pain or other concerns.       Past Medical History:  Diagnosis Date  . Cardiac arrest ([place])   . Cervical cancer ([place])   . Degenerative disc disease, thoracic   . Hypertension    Past Surgical History:  Procedure Laterality Date  . APPENDECTOMY    . CERVICAL CONE BIOPSY    . CHOLECYSTECTOMY    . LEEP    . Ovarian cyst     drained   The following portions of the patient's history were reviewed and updated as appropriate: allergies, current medications, past family history, past medical history, past social history, past surgical history and problem list.   Health Maintenance:  Normal pap on 09/14/17.   Review of Systems:  Pertinent items noted in HPI and remainder of comprehensive ROS otherwise negative.  Physical Exam:   General:  Alert, oriented and cooperative.   Mental Status: Normal mood and affect perceived. Normal judgment and thought content.  Physical exam deferred due to  nature of the encounter Ht 5\' 7"  (1.702 m)   Wt 199 lb (90.3 kg)   LMP  (LMP Unknown)   Breastfeeding No   BMI 31.17 kg/m  per pt.  Fall risk is low. Will get QCHG to see how far along the pregnancy is, and will rx phenergan for nausea, eat often.  She is aware that can not give the re assurance she is wanting that she will not have eclampsia again, or about postpartum bleeding.   Labs and Imaging No results found for this or any previous visit (from the past 336 hour(s)). No results found.    Assessment and Plan:     1. Positive pregnancy test -+HPTs - Beta hCG quant (ref lab)  2. Less than [redacted] weeks gestation of pregnancy - Beta hCG quant (ref lab)  3. Encounter to determine fetal viability of pregnancy, single or unspecified fetus - US OB Comp Less 14 Wks; Future, in about 3 weeks   4. History of eclampsia  5. History of postpartum hemorrhage, currently pregnant  6. Chronic hypertension affecting pregnancy - stop Vasotec and dyazide Take Labetalol 200 mg bid Meds ordered this encounter  Medications  . promethazine (PHENERGAN) 25 MG tablet    Sig: Take 1 tablet (25 mg total) by mouth every 6 (six) hours as needed for nausea or vomiting.    Dispense:  30 tablet    Refill:  1    Order Specific Question:   Supervising  Provider    Answer:   Tania Ade H [[id number]]  . labetalol (NORMODYNE) 100 MG tablet    Sig: Take 2 tablets (200 mg total) by mouth 2 (two) times daily.    Dispense:  60 tablet    Refill:  6    Order Specific Question:   Supervising Provider    Answer:   Florian Buff [[id number]]         I discussed the assessment and treatment plan with the patient. The patient was provided an opportunity to ask questions and all were answered. The patient agreed with the plan and demonstrated an understanding of the instructions.   The patient was advised to call back or seek an in-person evaluation/go to the ED if the symptoms worsen or if the condition fails to improve as  anticipated.  I provided  10 minutes of non-face-to-face time during this encounter.   Derrek Monaco, NP Center for [employer], [place]

## 2019-04-08 NOTE — Telephone Encounter (Signed)
noted 

## 2019-04-09 ENCOUNTER — Telehealth: Payer: Self-pay | Admitting: Adult Health

## 2019-04-09 NOTE — Telephone Encounter (Signed)
Left message @ 11:48 am. JSY

## 2019-04-09 NOTE — Telephone Encounter (Signed)
Spoke with pt. Will fax lab order to pt. Pt wants to have labs closer to where she works. [place]

## 2019-04-09 NOTE — Telephone Encounter (Signed)
Patient called stating that she would like for [place] to fax over her Lab orders to [place]: [phone] so she could do the blood work closer to work. Please contact p

## 2019-04-14 ENCOUNTER — Telehealth: Payer: Self-pay | Admitting: Adult Health

## 2019-04-14 NOTE — Telephone Encounter (Signed)
Patient called stating that she would like a call back from a nurse regarding her blood work that she had done on Friday. Please contact pt

## 2019-04-24 ENCOUNTER — Telehealth: Payer: Self-pay | Admitting: Obstetrics & Gynecology

## 2019-04-24 NOTE — Telephone Encounter (Signed)
Pt is wanting to see if she can be seen with a provider after her [place] tomorrow. She states that for a couple days she is having stomach pain, that feels like a knot right below her sternum. Also experiencing nausea from the pain. She has taken nausea medication, Tums, etc, nothing helps.

## 2019-04-24 NOTE — Telephone Encounter (Signed)
Pt to see JAG at 10 am tomorrow. Pt aware. Regina Foley

## 2019-04-25 ENCOUNTER — Other Ambulatory Visit: Payer: Self-pay

## 2019-04-25 ENCOUNTER — Ambulatory Visit (INDEPENDENT_AMBULATORY_CARE_PROVIDER_SITE_OTHER): Payer: 59 | Admitting: Adult Health

## 2019-04-25 ENCOUNTER — Encounter: Payer: Self-pay | Admitting: Adult Health

## 2019-04-25 ENCOUNTER — Ambulatory Visit (INDEPENDENT_AMBULATORY_CARE_PROVIDER_SITE_OTHER): Payer: 59

## 2019-04-25 VITALS — BP 132/88 | HR 100 | Temp 98.4°F | Ht 66.0 in | Wt 205.0 lb

## 2019-04-25 DIAGNOSIS — R1013 Epigastric pain: Secondary | ICD-10-CM | POA: Diagnosis not present

## 2019-04-25 DIAGNOSIS — Z331 Pregnant state, incidental: Secondary | ICD-10-CM

## 2019-04-25 DIAGNOSIS — Z3A10 10 weeks gestation of pregnancy: Secondary | ICD-10-CM | POA: Diagnosis not present

## 2019-04-25 DIAGNOSIS — O3680X0 Pregnancy with inconclusive fetal viability, not applicable or unspecified: Secondary | ICD-10-CM

## 2019-04-25 DIAGNOSIS — Z1389 Encounter for screening for other disorder: Secondary | ICD-10-CM | POA: Diagnosis not present

## 2019-04-25 DIAGNOSIS — K219 Gastro-esophageal reflux disease without esophagitis: Secondary | ICD-10-CM | POA: Diagnosis not present

## 2019-04-25 LAB — POCT URINALYSIS DIPSTICK OB
Blood, UA: NEGATIVE
Glucose, UA: NEGATIVE
Ketones, UA: NEGATIVE
Leukocytes, UA: NEGATIVE
Nitrite, UA: NEGATIVE
POC,PROTEIN,UA: NEGATIVE

## 2019-04-25 MED ORDER — PANTOPRAZOLE SODIUM 40 MG PO TBEC: 40.0000 mg | DELAYED_RELEASE_TABLET | Freq: Every day | ORAL | 3 refills | Status: DC

## 2019-04-25 NOTE — Progress Notes (Addendum)
Patient ID: Regina Foley, female   DOB: 1986/10/25, 33 y.o.   MRN: [id number] History of Present Illness: Regina Foley is a 33 year old white female, G3P1102 in for her dating [place] and is complaining of pain in upper abdomen, has used Prilosec and TUMS with no relief.    Current Medications, Allergies, Past Medical History, Past Surgical History, Family History and Social History were reviewed in [employer] record.     Review of Systems: Pain in mid epigastric area If pain "bad" has nausea    Physical Exam:BP 132/88 (BP Location: Left Arm, Patient Position: Sitting, Cuff Size: Normal)   Pulse 100   Temp 98.4 F (36.9 C)   Ht 5\' 6"  (1.676 m)   Wt 205 lb (93 kg)   LMP  (LMP Unknown)   Breastfeeding No   BMI 33.09 kg/m   Urine dipstick is negative.  General:  Well developed, well nourished, no acute distress Skin:  Warm and dry Lungs; Clear to auscultation bilaterally Cardiovascular: Regular rate and rhythm Abdomen:  Soft,+epigastric tenderness, no hepatosplenomegaly Psych:  No mood changes, alert and cooperative,seems happy US showed 10+1 weeks with EDD 11/30/19, and FHR 171.  Will try Protonix, decrease greasy foods, has had Gallbaldder removed.    Impression: 1. Epigastric pain Meds ordered this encounter  Medications  . pantoprazole (PROTONIX) 40 MG tablet    Sig: Take 1 tablet (40 mg total) by mouth daily.    Dispense:  30 tablet    Refill:  3    Order Specific Question:   Supervising Provider    Answer:   Elonda Husky, LUTHER H [[id number]]    2. Gastroesophageal reflux disease, esophagitis presence not specified .-will try Protonix and decrease greasy foods  3. Screening for genitourinary condition - POC Urinalysis Dipstick OB  4. Pregnant state, incidental - POC Urinalysis Dipstick OB    Plan: Take Protonix Decrease greasy foods Return in 3 weeks for IT/NT and new OB

## 2019-04-25 NOTE — Progress Notes (Signed)
[place] 10+1 wks,single IUP w/ys,fhr 171 bpm,crl 33.36 mm,normal ovaries bilat

## 2019-04-29 ENCOUNTER — Other Ambulatory Visit: Payer: 59

## 2019-05-19 ENCOUNTER — Other Ambulatory Visit: Payer: Self-pay | Admitting: Adult Health

## 2019-05-19 ENCOUNTER — Encounter: Payer: Self-pay | Admitting: *Deleted

## 2019-05-19 DIAGNOSIS — O3680X0 Pregnancy with inconclusive fetal viability, not applicable or unspecified: Secondary | ICD-10-CM

## 2019-05-19 DIAGNOSIS — Z3682 Encounter for antenatal screening for nuchal translucency: Secondary | ICD-10-CM

## 2019-05-20 ENCOUNTER — Ambulatory Visit (INDEPENDENT_AMBULATORY_CARE_PROVIDER_SITE_OTHER): Payer: 59 | Admitting: Women's Health

## 2019-05-20 ENCOUNTER — Ambulatory Visit: Payer: 59 | Admitting: *Deleted

## 2019-05-20 ENCOUNTER — Other Ambulatory Visit: Payer: Self-pay

## 2019-05-20 ENCOUNTER — Ambulatory Visit (INDEPENDENT_AMBULATORY_CARE_PROVIDER_SITE_OTHER): Payer: 59

## 2019-05-20 ENCOUNTER — Encounter: Payer: Self-pay | Admitting: Women's Health

## 2019-05-20 VITALS — BP 140/92 | HR 110 | Wt 204.0 lb

## 2019-05-20 DIAGNOSIS — O10919 Unspecified pre-existing hypertension complicating pregnancy, unspecified trimester: Secondary | ICD-10-CM

## 2019-05-20 DIAGNOSIS — O0990 Supervision of high risk pregnancy, unspecified, unspecified trimester: Secondary | ICD-10-CM | POA: Insufficient documentation

## 2019-05-20 DIAGNOSIS — O3680X0 Pregnancy with inconclusive fetal viability, not applicable or unspecified: Secondary | ICD-10-CM

## 2019-05-20 DIAGNOSIS — Z1389 Encounter for screening for other disorder: Secondary | ICD-10-CM

## 2019-05-20 DIAGNOSIS — Z1379 Encounter for other screening for genetic and chromosomal anomalies: Secondary | ICD-10-CM

## 2019-05-20 DIAGNOSIS — O09299 Supervision of pregnancy with other poor reproductive or obstetric history, unspecified trimester: Secondary | ICD-10-CM

## 2019-05-20 DIAGNOSIS — Z3A13 13 weeks gestation of pregnancy: Secondary | ICD-10-CM

## 2019-05-20 DIAGNOSIS — Z331 Pregnant state, incidental: Secondary | ICD-10-CM

## 2019-05-20 DIAGNOSIS — O09291 Supervision of pregnancy with other poor reproductive or obstetric history, first trimester: Secondary | ICD-10-CM

## 2019-05-20 DIAGNOSIS — O0991 Supervision of high risk pregnancy, unspecified, first trimester: Secondary | ICD-10-CM

## 2019-05-20 DIAGNOSIS — Z3682 Encounter for antenatal screening for nuchal translucency: Secondary | ICD-10-CM

## 2019-05-20 DIAGNOSIS — O10911 Unspecified pre-existing hypertension complicating pregnancy, first trimester: Secondary | ICD-10-CM

## 2019-05-20 DIAGNOSIS — F449 Dissociative and conversion disorder, unspecified: Secondary | ICD-10-CM

## 2019-05-20 LAB — POCT URINALYSIS DIPSTICK OB
Blood, UA: NEGATIVE
Glucose, UA: NEGATIVE
Ketones, UA: NEGATIVE
Leukocytes, UA: NEGATIVE
Nitrite, UA: NEGATIVE
POC,PROTEIN,UA: NEGATIVE

## 2019-05-20 MED ORDER — PROMETHAZINE HCL 25 MG PO TABS: 25.0000 mg | ORAL_TABLET | Freq: Four times a day (QID) | ORAL | 1 refills | Status: DC | PRN

## 2019-05-20 MED ORDER — SCOPOLAMINE 1 MG/3DAYS TD PT72: 1.0000 | MEDICATED_PATCH | TRANSDERMAL | 12 refills | Status: DC

## 2019-05-20 NOTE — Progress Notes (Signed)
[place] 13+5 wks,measurements c/w dates,crl 75.94 mm,NB present,NT 2.5 mm,normal ovaries bilat,fhr 167 bpm,posterior placenta

## 2019-05-20 NOTE — Progress Notes (Signed)
INITIAL OBSTETRICAL VISIT Patient name: Regina Foley MRN [id number]  Date of birth: 09/10/1986 Chief Complaint:   Initial Prenatal Visit (nausea, nt/it)  History of Present Illness:   Regina Foley is a 33 y.o. G14P1102 Caucasian female at [redacted]w[redacted]d by 10wk u/s, with an Estimated Date of Delivery: 11/20/19 being seen today for her initial obstetrical visit.   Her obstetrical history is significant for term SVB x 1 w/ mild PPH, then 36.1wk PTB after IOL d/t CHTN w/ severe pre-e w/ severe PPH requiring methergine/hemabate/cytotec/TXA/2uPRBC drop in hgb from 13.1 to 5.4. Unplanned pregnancy, was on POPs while breastfeeding, switched to COCs, didn't skip any. Very anxious about this pregnancy, d/t PPH. Wants BTL. CHTN on bystolic 10mg  daily prior to pregnancy, switched to Labetalol 200mg  BID   Per notes, h/o "cardiac arrest" per pt report during cardiac ablation for SVT, notes reveal she had a tachyarrhythmia w/ rate in 250's and procedure had to be aborted. Multiple admissions for syncopal episodes/seizure like activity, tachycardia, HTN, paresthesias, multiple intubations- all workups have been negative, final dx by [facility] was conversion disorder.  Today she reports continued nausea, phenergan makes her sleepy, allergic to diclegis, offered zofran- she has motion sickness and would like to try scop patch.  No LMP recorded (lmp unknown). Patient is pregnant. Last pap 09/14/17. Results were: normal Review of Systems:   Pertinent items are noted in HPI Denies cramping/contractions, leakage of fluid, vaginal bleeding, abnormal vaginal discharge w/ itching/odor/irritation, headaches, visual changes, shortness of breath, chest pain, abdominal pain, severe nausea/vomiting, or problems with urination or bowel movements unless otherwise stated above.  Pertinent History Reviewed:  Reviewed past medical,surgical, social, obstetrical and family history.  Reviewed problem list, medications and allergies. OB History   Gravida Para Term Preterm AB Living  3 2 1 1   2   SAB TAB Ectopic Multiple Live Births        0 2    # Outcome Date GA Lbr Len/2nd Weight Sex Delivery Anes PTL Lv  3 Current           2 Preterm 03/15/18 [redacted]w[redacted]d 05:09 / 00:11 7 lb 1.5 oz (3.218 kg) F Vag-Spont EPI  LIV     Birth Comments: PPH  1 Term 11/18/07 [redacted]w[redacted]d  7 lb 9 oz (3.43 kg) F Vag-Spont EPI  LIV     Birth Comments: Hyperemesis,HTN, PPH   Physical Assessment:   Vitals:   05/20/19 1422 05/20/19 1441  BP: (!) 149/106 (!) 140/92  Pulse: (!) 119 (!) 110  Weight: 204 lb (92.5 kg)   Body mass index is 32.93 kg/m.       Physical Examination:  General appearance - well appearing, and in no distress  Mental status - alert, oriented to person, place, and time  Psych:  She has a normal mood and affect  Skin - warm and dry, normal color, no suspicious lesions noted  Chest - effort normal, all lung fields clear to auscultation bilaterally  Heart - normal rate and regular rhythm  Abdomen - soft, nontender  Extremities:  No swelling or varicosities noted  Thin prep pap is not done  TODAY'S NT [place] 13+5 wks,measurements c/w dates,crl 75.94 mm,NB present,NT 2.5 mm,normal ovaries bilat,fhr 167 bpm,posterior placenta    Assessment & Plan:  1) High-Risk Pregnancy G3P1102 at [redacted]w[redacted]d with an Estimated Date of Delivery: 11/20/19   2) Initial OB visit  3) CHTN> on bystolic prior to pregnancy, switched earlier to labetalol 200mg  BID. Initial bp today elevated, better on  recheck, states she had a very stressful day at work. Has home bp cuff. To start checking QID. Start ASA 162mg  today. Baseline labs today  4) H/O PPH x 2> last one severe, pt wants MD in room @ delivery  5) Conversion disorder> h/o multiple admissions for syncope/seizure like activity, tachycardia, HTN, paresthesias w/ multiple intubations- all workups negative, final dx @ [facility] was conversion disorder  6) Nausea> w/ motion sickness, requests scopolamine patch and refill on  phenergan  Meds:  Meds ordered this encounter  Medications  . scopolamine (TRANSDERM-SCOP) 1 MG/3DAYS    Sig: Place 1 patch (1.5 mg total) onto the skin every 3 (three) days.    Dispense:  10 patch    Refill:  12    Order Specific Question:   Supervising Provider    Answer:   Elonda Husky, LUTHER H [[id number]]  . promethazine (PHENERGAN) 25 MG tablet    Sig: Take 1 tablet (25 mg total) by mouth every 6 (six) hours as needed for nausea or vomiting.    Dispense:  30 tablet    Refill:  1    Order Specific Question:   Supervising Provider    Answer:   Florian Buff [[id number]]    Initial labs obtained Continue prenatal vitamins Reviewed n/v relief measures and warning s/s to report Reviewed recommended weight gain based on pre-gravid BMI Encouraged well-balanced diet Genetic Screening discussed: requested nt/it, declined maternit21 Cystic fibrosis, SMA, Fragile X screening discussed declined Ultrasound discussed; fetal survey: requested CCNC completed  Follow-up: Return in about 2 weeks (around 06/03/2019) for HROB in office.   Orders Placed This Encounter  Procedures  . Urine Culture  . GC/Chlamydia Probe Amp  . Integrated 1  . Obstetric Panel, Including HIV  . Urinalysis, Routine w reflex microscopic  . Sickle cell screen  . Pain Management Screening Profile (10S)  . Comprehensive metabolic panel  . Protein, urine, 24 hour  . POC Urinalysis Dipstick OB    Roma Schanz CNM, [facility] 05/20/2019

## 2019-05-20 NOTE — Patient Instructions (Addendum)
Regina Foley, I greatly value your feedback.  If you receive a survey following your visit with [place] today, we appreciate you taking the time to fill it out.  Thanks, [name], CNM, [facility]  [employer]!!! It is now [name] at [facility] ([place], [place] [address]) Entrance located off of [address] parking   Begin taking 162mg  (two 81mg  tablets) baby aspirin daily to decrease risk of preeclampsia during pregnancy    Home Blood Pressure Monitoring for Patients   Check your blood pressure 4 times daily and keep a log, bring this with you to all appointments.   Helpful Tips for Accurate Home Blood Pressure Checks  . Don't smoke, exercise, or drink caffeine 30 minutes before checking your BP . Use the restroom before checking your BP (a full bladder can raise your pressure) . Relax in a comfortable upright chair . Feet on the ground . Left arm resting comfortably on a flat surface at the level of your heart . Legs uncrossed . Back supported . Sit quietly and don't talk . Place the cuff on your bare arm . Adjust snuggly, so that only two fingertips can fit between your skin and the top of the cuff . Check 2 readings separated by at least one minute . Keep a log of your BP readings . For a visual, please reference this diagram: http://ccnc.care/bpdiagram  Provider Name: [facility]     Phone: [phone]  Zone 1: ALL CLEAR  Continue to monitor your symptoms:  . BP reading is less than 140 (top number) or less than 90 (bottom number)  . No right upper stomach pain . No headaches or seeing spots . No feeling nauseated or throwing up . No swelling in face and hands  Zone 2: CAUTION Call your doctor's office for any of the following:  . BP reading is greater than 140 (top number) or greater than 90 (bottom number)  . Stomach pain under your ribs in the middle or right side . Headaches or seeing spots .  Feeling nauseated or throwing up . Swelling in face and hands  Zone 3: EMERGENCY  Seek immediate medical care if you have any of the following:  . BP reading is greater than160 (top number) or greater than 110 (bottom number) . Severe headaches not improving with Tylenol . Serious difficulty catching your breath . Any worsening symptoms from Zone 2     Nausea & Vomiting  Have saltine crackers or pretzels by your bed and eat a few bites before you raise your head out of bed in the morning  Eat small frequent meals throughout the day instead of large meals  Drink plenty of fluids throughout the day to stay hydrated, just don't drink a lot of fluids with your meals.  This can make your stomach fill up faster making you feel sick  Do not brush your teeth right after you eat  Products with real ginger are good for nausea, like ginger ale and ginger hard candy Make sure it says made with real ginger!  Sucking on sour candy like lemon heads is also good for nausea  If your prenatal vitamins make you nauseated, take them at night so you will sleep through the nausea  Sea Bands  If you feel like you need medicine for the nausea & vomiting please let us know  If you are unable to keep any fluids or food down  please let us know   Constipation  Drink plenty of fluid, preferably water, throughout the day  Eat foods high in fiber such as fruits, vegetables, and grains  Exercise, such as walking, is a good way to keep your bowels regular  Drink warm fluids, especially warm prune juice, or decaf coffee  Eat a 1/2 cup of real oatmeal (not instant), 1/2 cup applesauce, and 1/2-1 cup warm prune juice every day  If needed, you may take Colace (docusate sodium) stool softener once or twice a day to help keep the stool soft. If you are pregnant, wait until you are out of your first trimester (12-14 weeks of pregnancy)  If you still are having problems with constipation, you may take Miralax  once daily as needed to help keep your bowels regular.  If you are pregnant, wait until you are out of your first trimester (12-14 weeks of pregnancy)   First Trimester of Pregnancy The first trimester of pregnancy is from week 1 until the end of week 12 (months 1 through 3). A week after a sperm fertilizes an egg, the egg will implant on the wall of the uterus. This embryo will begin to develop into a baby. Genes from you and your partner are forming the baby. The female genes determine whether the baby is a boy or a girl. At 6-8 weeks, the eyes and face are formed, and the heartbeat can be seen on ultrasound. At the end of 12 weeks, all the baby's organs are formed.  Now that you are pregnant, you will want to do everything you can to have a healthy baby. Two of the most important things are to get good prenatal care and to follow your health care provider's instructions. Prenatal care is all the medical care you receive before the baby's birth. This care will help prevent, find, and treat any problems during the pregnancy and childbirth. BODY CHANGES Your body goes through many changes during pregnancy. The changes vary from woman to woman.   You may gain or lose a couple of pounds at first.  You may feel sick to your stomach (nauseous) and throw up (vomit). If the vomiting is uncontrollable, call your health care provider.  You may tire easily.  You may develop headaches that can be relieved by medicines approved by your health care provider.  You may urinate more often. Painful urination may mean you have a bladder infection.  You may develop heartburn as a result of your pregnancy.  You may develop constipation because certain hormones are causing the muscles that push waste through your intestines to slow down.  You may develop hemorrhoids or swollen, bulging veins (varicose veins).  Your breasts may begin to grow larger and become tender. Your nipples may stick out more, and the tissue  that surrounds them (areola) may become darker.  Your gums may bleed and may be sensitive to brushing and flossing.  Dark spots or blotches (chloasma, mask of pregnancy) may develop on your face. This will likely fade after the baby is born.  Your menstrual periods will stop.  You may have a loss of appetite.  You may develop cravings for certain kinds of food.  You may have changes in your emotions from day to day, such as being excited to be pregnant or being concerned that something may go wrong with the pregnancy and baby.  You may have more vivid and strange dreams.  You may have changes in your hair. These can include  thickening of your hair, rapid growth, and changes in texture. Some women also have hair loss during or after pregnancy, or hair that feels dry or thin. Your hair will most likely return to normal after your baby is born. WHAT TO EXPECT AT YOUR PRENATAL VISITS During a routine prenatal visit:  You will be weighed to make sure you and the baby are growing normally.  Your blood pressure will be taken.  Your abdomen will be measured to track your baby's growth.  The fetal heartbeat will be listened to starting around week 10 or 12 of your pregnancy.  Test results from any previous visits will be discussed. Your health care provider may ask you:  How you are feeling.  If you are feeling the baby move.  If you have had any abnormal symptoms, such as leaking fluid, bleeding, severe headaches, or abdominal cramping.  If you have any questions. Other tests that may be performed during your first trimester include:  Blood tests to find your blood type and to check for the presence of any previous infections. They will also be used to check for low iron levels (anemia) and Rh antibodies. Later in the pregnancy, blood tests for diabetes will be done along with other tests if problems develop.  Urine tests to check for infections, diabetes, or protein in the urine.   An ultrasound to confirm the proper growth and development of the baby.  An amniocentesis to check for possible genetic problems.  Fetal screens for spina bifida and Down syndrome.  You may need other tests to make sure you and the baby are doing well. HOME CARE INSTRUCTIONS  Medicines  Follow your health care provider's instructions regarding medicine use. Specific medicines may be either safe or unsafe to take during pregnancy.  Take your prenatal vitamins as directed.  If you develop constipation, try taking a stool softener if your health care provider approves. Diet  Eat regular, well-balanced meals. Choose a variety of foods, such as meat or vegetable-based protein, fish, milk and low-fat dairy products, vegetables, fruits, and whole grain breads and cereals. Your health care provider will help you determine the amount of weight gain that is right for you.  Avoid raw meat and uncooked cheese. These carry germs that can cause birth defects in the baby.  Eating four or five small meals rather than three large meals a day may help relieve nausea and vomiting. If you start to feel nauseous, eating a few soda crackers can be helpful. Drinking liquids between meals instead of during meals also seems to help nausea and vomiting.  If you develop constipation, eat more high-fiber foods, such as fresh vegetables or fruit and whole grains. Drink enough fluids to keep your urine clear or pale yellow. Activity and Exercise  Exercise only as directed by your health care provider. Exercising will help you:  Control your weight.  Stay in shape.  Be prepared for labor and delivery.  Experiencing pain or cramping in the lower abdomen or low back is a good sign that you should stop exercising. Check with your health care provider before continuing normal exercises.  Try to avoid standing for long periods of time. Move your legs often if you must stand in one place for a long time.  Avoid  heavy lifting.  Wear low-heeled shoes, and practice good posture.  You may continue to have sex unless your health care provider directs you otherwise. Relief of Pain or Discomfort  Wear a good support  bra for breast tenderness.    Take warm sitz baths to soothe any pain or discomfort caused by hemorrhoids. Use hemorrhoid cream if your health care provider approves.    Rest with your legs elevated if you have leg cramps or low back pain.  If you develop varicose veins in your legs, wear support hose. Elevate your feet for 15 minutes, 3-4 times a day. Limit salt in your diet. Prenatal Care  Schedule your prenatal visits by the twelfth week of pregnancy. They are usually scheduled monthly at first, then more often in the last 2 months before delivery.  Write down your questions. Take them to your prenatal visits.  Keep all your prenatal visits as directed by your health care provider. Safety  Wear your seat belt at all times when driving.  Make a list of emergency phone numbers, including numbers for family, friends, the hospital, and police and fire departments. General Tips  Ask your health care provider for a referral to a local prenatal education class. Begin classes no later than at the beginning of month 6 of your pregnancy.  Ask for help if you have counseling or nutritional needs during pregnancy. Your health care provider can offer advice or refer you to specialists for help with various needs.  Do not use hot tubs, steam rooms, or saunas.  Do not douche or use tampons or scented sanitary pads.  Do not cross your legs for long periods of time.  Avoid cat litter boxes and soil used by cats. These carry germs that can cause birth defects in the baby and possibly loss of the fetus by miscarriage or stillbirth.  Avoid all smoking, herbs, alcohol, and medicines not prescribed by your health care provider. Chemicals in these affect the formation and growth of the baby.   Schedule a dentist appointment. At home, brush your teeth with a soft toothbrush and be gentle when you floss. SEEK MEDICAL CARE IF:   You have dizziness.  You have mild pelvic cramps, pelvic pressure, or nagging pain in the abdominal area.  You have persistent nausea, vomiting, or diarrhea.  You have a bad smelling vaginal discharge.  You have pain with urination.  You notice increased swelling in your face, hands, legs, or ankles. SEEK IMMEDIATE MEDICAL CARE IF:   You have a fever.  You are leaking fluid from your vagina.  You have spotting or bleeding from your vagina.  You have severe abdominal cramping or pain.  You have rapid weight gain or loss.  You vomit blood or material that looks like coffee grounds.  You are exposed to [place] measles and have never had them.  You are exposed to fifth disease or chickenpox.  You develop a severe headache.  You have shortness of breath.  You have any kind of trauma, such as from a fall or a car accident. Document Released: 11/21/2001 Document Revised: 04/13/2014 Document Reviewed: 10/07/2013 [facility] Patient Information 2015 [place], [place]. This information is not intended to replace advice given to you by your health care provider. Make sure you discuss any questions you have with your health care provider.

## 2019-05-21 LAB — INTEGRATED 1

## 2019-05-21 LAB — PMP SCREEN PROFILE (10S), URINE
Amphetamine Scrn, Ur: NEGATIVE ng/mL
BARBITURATE SCREEN URINE: NEGATIVE ng/mL
BENZODIAZEPINE SCREEN, URINE: NEGATIVE ng/mL
CANNABINOIDS UR QL SCN: NEGATIVE ng/mL
Cocaine (Metab) Scrn, Ur: NEGATIVE ng/mL
Creatinine(Crt), U: 114.5 mg/dL (ref 20.0–300.0)
Methadone Screen, Urine: NEGATIVE ng/mL
OXYCODONE+OXYMORPHONE UR QL SCN: NEGATIVE ng/mL
Opiate Scrn, Ur: NEGATIVE ng/mL
Ph of Urine: 6.8 (ref 4.5–8.9)
Phencyclidine Qn, Ur: NEGATIVE ng/mL
Propoxyphene Scrn, Ur: NEGATIVE ng/mL

## 2019-05-22 LAB — COMPREHENSIVE METABOLIC PANEL
ALT: 11 IU/L (ref 0–32)
AST: 17 IU/L (ref 0–40)
Albumin/Globulin Ratio: 1.7 (ref 1.2–2.2)
Albumin: 4.5 g/dL (ref 3.9–5.0)
Alkaline Phosphatase: 52 IU/L (ref 39–117)
BUN/Creatinine Ratio: 19 (ref 9–23)
BUN: 11 mg/dL (ref 6–20)
Bilirubin Total: <0.2 mg/dL (ref 0.0–1.2)
CO2: 19 mmol/L — ABNORMAL LOW (ref 20–29)
Calcium: 10 mg/dL (ref 8.7–10.2)
Chloride: 101 mmol/L (ref 96–106)
Creatinine, Ser: 0.59 mg/dL (ref 0.57–1.00)
GFR calc Af Amer: 142 mL/min/{1.73_m2} (ref >59)
GFR calc non Af Amer: 123 mL/min/{1.73_m2} (ref >59)
Globulin, Total: 2.6 g/dL (ref 1.5–4.5)
Glucose: 94 mg/dL (ref 65–99)
Potassium: 3.9 mmol/L (ref 3.5–5.2)
Sodium: 138 mmol/L (ref 134–144)
Total Protein: 7.1 g/dL (ref 6.0–8.5)

## 2019-05-22 LAB — URINE CULTURE

## 2019-05-23 LAB — OBSTETRIC PANEL, INCLUDING HIV
Antibody Screen: NEGATIVE
Basophils Absolute: 0 10*3/uL (ref 0.0–0.2)
Basos: 0 %
EOS (ABSOLUTE): 0.1 10*3/uL (ref 0.0–0.4)
Eos: 1 %
HIV Screen 4th Generation wRfx: NONREACTIVE
Hematocrit: 37.9 % (ref 34.0–46.6)
Hemoglobin: 13.4 g/dL (ref 11.1–15.9)
Hepatitis B Surface Ag: NEGATIVE
Immature Grans (Abs): 0 10*3/uL (ref 0.0–0.1)
Immature Granulocytes: 0 %
Lymphocytes Absolute: 2.3 10*3/uL (ref 0.7–3.1)
Lymphs: 20 %
MCH: 30.3 pg (ref 26.6–33.0)
MCHC: 35.4 g/dL (ref 31.5–35.7)
MCV: 86 fL (ref 79–97)
Monocytes Absolute: 0.6 10*3/uL (ref 0.1–0.9)
Monocytes: 5 %
Neutrophils Absolute: 8.3 10*3/uL — ABNORMAL HIGH (ref 1.4–7.0)
Neutrophils: 74 %
Platelets: 220 10*3/uL (ref 150–450)
RBC: 4.42 x10E6/uL (ref 3.77–5.28)
RDW: 12.2 % (ref 11.7–15.4)
RPR Ser Ql: NONREACTIVE
Rh Factor: POSITIVE
Rubella Antibodies, IGG: 24.5 index (ref >0.99)
WBC: 11.3 10*3/uL — ABNORMAL HIGH (ref 3.4–10.8)

## 2019-05-23 LAB — INTEGRATED 1
Crown Rump Length: 75.9 mm
Gest. Age on Collection Date: 13.4 weeks
Maternal Age at EDD: 31.2 yr
Nuchal Translucency (NT): 2.5 mm
Number of Fetuses: 1
PAPP-A Value: 712.5 ng/mL
Weight: 204 [lb_av]

## 2019-05-23 LAB — URINALYSIS, ROUTINE W REFLEX MICROSCOPIC
Bilirubin, UA: NEGATIVE
Glucose, UA: NEGATIVE
Ketones, UA: NEGATIVE
Leukocytes,UA: NEGATIVE
Nitrite, UA: NEGATIVE
Protein,UA: NEGATIVE
RBC, UA: NEGATIVE
Specific Gravity, UA: 1.022 (ref 1.005–1.030)
Urobilinogen, Ur: 0.2 mg/dL (ref 0.2–1.0)
pH, UA: 7 (ref 5.0–7.5)

## 2019-05-23 LAB — SICKLE CELL SCREEN: Sickle Cell Screen: NEGATIVE

## 2019-05-28 LAB — GC/CHLAMYDIA PROBE AMP
Chlamydia trachomatis, NAA: NEGATIVE
Neisseria Gonorrhoeae by PCR: NEGATIVE

## 2019-05-31 ENCOUNTER — Other Ambulatory Visit: Payer: Self-pay | Admitting: Adult Health

## 2019-06-06 ENCOUNTER — Encounter: Payer: 59 | Admitting: Women's Health

## 2019-06-06 ENCOUNTER — Other Ambulatory Visit: Payer: Self-pay

## 2019-06-06 NOTE — Progress Notes (Addendum)
Pt here for bp check, CHTN on Labetalol 200mg  BID. BP here 152/104. Brought BP log w/ her, 124-144/79-99. All <150/95 but one DBP of 99 on 6/17. Majority 130s/80s. These were all w/ her work bp cuff, which stays well calibrated, so she is certain they are correct. To continue checking QID, let us know if getting multiple >150/95, would increase meds at that time. Has appt in 2wks for HROB and anatomy u/s, will re-evaluate then.  Roma Schanz, CNM, [facility] 06/06/2019 1:53 PM

## 2019-06-08 LAB — PROTEIN, URINE, 24 HOUR
Protein, 24H Urine: 524 mg/24 hr — ABNORMAL HIGH (ref 30–150)
Protein, Ur: 26.2 mg/dL

## 2019-06-12 ENCOUNTER — Telehealth: Payer: Self-pay | Admitting: Advanced Practice Midwife

## 2019-06-12 ENCOUNTER — Telehealth: Payer: Self-pay | Admitting: *Deleted

## 2019-06-12 NOTE — Progress Notes (Signed)
This encounter was created in error - please disregard.

## 2019-06-12 NOTE — Telephone Encounter (Signed)
Has a questions about a mychart labe result please call (urine protein)

## 2019-06-12 NOTE — Telephone Encounter (Signed)
Patient called with concerns regarding 24 hour protein.  Informed patient that this was elevated but was done to have a baseline since she has CHTN.  The plan of care will not change for her at this time, but these labs will be beneficial later in the pregnancy.  Pt verbalized understanding.

## 2019-06-20 ENCOUNTER — Other Ambulatory Visit: Payer: 59

## 2019-06-20 ENCOUNTER — Encounter: Payer: 59 | Admitting: Women's Health

## 2019-06-24 ENCOUNTER — Telehealth: Payer: Self-pay | Admitting: Women's Health

## 2019-06-24 NOTE — Telephone Encounter (Signed)
Patient called, she has an anatomy ultrasound scheduled for 8/07 and an appointment with Maudie Mercury on 8/07.  She can only do Friday's.  She wants her ultrasound moved earlier. Amber doesn't have anything.  The patient wants to have it done at [facility] and then would like to follow up with Dr. Elonda Husky and not Maudie Mercury.  She likes Maudie Mercury, but wants to see an actual doctor on her next visit.  Can you set this up for her?  [phone]

## 2019-06-25 ENCOUNTER — Telehealth: Payer: Self-pay | Admitting: *Deleted

## 2019-06-25 NOTE — Telephone Encounter (Signed)
Spoke to patient and informed her Regina Foley has no available U/S appts next week.  Pt stated she was fine and will still come as scheduled but would like to see Regina Foley.  Informed I can move her to his scheduled as requested.

## 2019-07-17 ENCOUNTER — Other Ambulatory Visit: Payer: Self-pay | Admitting: Women's Health

## 2019-07-17 DIAGNOSIS — Z363 Encounter for antenatal screening for malformations: Secondary | ICD-10-CM

## 2019-07-18 ENCOUNTER — Encounter: Payer: Self-pay | Admitting: Women's Health

## 2019-07-18 ENCOUNTER — Ambulatory Visit (INDEPENDENT_AMBULATORY_CARE_PROVIDER_SITE_OTHER): Payer: Managed Care, Other (non HMO)

## 2019-07-18 ENCOUNTER — Other Ambulatory Visit: Payer: Self-pay

## 2019-07-18 ENCOUNTER — Ambulatory Visit (INDEPENDENT_AMBULATORY_CARE_PROVIDER_SITE_OTHER): Payer: Managed Care, Other (non HMO) | Admitting: Women's Health

## 2019-07-18 VITALS — BP 135/95 | HR 93 | Wt 210.5 lb

## 2019-07-18 DIAGNOSIS — Z331 Pregnant state, incidental: Secondary | ICD-10-CM

## 2019-07-18 DIAGNOSIS — O10919 Unspecified pre-existing hypertension complicating pregnancy, unspecified trimester: Secondary | ICD-10-CM

## 2019-07-18 DIAGNOSIS — Z3A22 22 weeks gestation of pregnancy: Secondary | ICD-10-CM

## 2019-07-18 DIAGNOSIS — Z363 Encounter for antenatal screening for malformations: Secondary | ICD-10-CM

## 2019-07-18 DIAGNOSIS — Z1379 Encounter for other screening for genetic and chromosomal anomalies: Secondary | ICD-10-CM

## 2019-07-18 DIAGNOSIS — O0990 Supervision of high risk pregnancy, unspecified, unspecified trimester: Secondary | ICD-10-CM

## 2019-07-18 DIAGNOSIS — Z1389 Encounter for screening for other disorder: Secondary | ICD-10-CM

## 2019-07-18 DIAGNOSIS — O10912 Unspecified pre-existing hypertension complicating pregnancy, second trimester: Secondary | ICD-10-CM

## 2019-07-18 DIAGNOSIS — O0992 Supervision of high risk pregnancy, unspecified, second trimester: Secondary | ICD-10-CM

## 2019-07-18 LAB — POCT URINALYSIS DIPSTICK OB
Blood, UA: NEGATIVE
Glucose, UA: NEGATIVE
Ketones, UA: NEGATIVE
Leukocytes, UA: NEGATIVE
Nitrite, UA: NEGATIVE
POC,PROTEIN,UA: NEGATIVE

## 2019-07-18 NOTE — Progress Notes (Signed)
[place] 22+1 wks,breech,posterior placenta gr 0,normal ovaries bilat,svp of fluid 7.1 cm,fhr 132 bpm,cx 4.4 cm,efw 516 g 65%,anatomy complete,no obvious abnormalities

## 2019-07-18 NOTE — Progress Notes (Signed)
   HIGH-RISK PREGNANCY VISIT Patient name: Regina Foley MRN [id number]  Date of birth: Dec 10, 1986 Chief Complaint:   High Risk Gestation ([place] today; 2nd IT)  History of Present Illness:   Regina Foley is a 33 y.o. [id number] female at [redacted]w[redacted]d with an Estimated Date of Delivery: 11/20/19 being seen today for ongoing management of a high-risk pregnancy complicated by [facility] currently on no meds, was on Labetalol 200mg  BID, home bp's were normal, so has stopped; h/o severe PPH x 2 Today she reports no complaints. Contractions: Not present. Vag. Bleeding: None.  Movement: Present. denies leaking of fluid.  Review of Systems:   Pertinent items are noted in HPI Denies abnormal vaginal discharge w/ itching/odor/irritation, headaches, visual changes, shortness of breath, chest pain, abdominal pain, severe nausea/vomiting, or problems with urination or bowel movements unless otherwise stated above. Pertinent History Reviewed:  Reviewed past medical,surgical, social, obstetrical and family history.  Reviewed problem list, medications and allergies. Physical Assessment:   Vitals:   07/18/19 1010 07/18/19 1016  BP: (!) 144/96 (!) 135/95  Pulse: 98 93  Weight: 210 lb 8 oz (95.5 kg)   Body mass index is 33.98 kg/m.           Physical Examination:   General appearance: alert, well appearing, and in no distress  Mental status: alert, oriented to person, place, and time  Skin: warm & dry   Extremities: Edema: None    Cardiovascular: normal heart rate noted  Respiratory: normal respiratory effort, no distress  Abdomen: gravid, soft, non-tender  Pelvic: Cervical exam deferred         Fetal Status: Fetal Heart Rate (bpm): 132 u/s   Movement: Present    Fetal Surveillance Testing today: [place] 22+1 wks,breech,posterior placenta gr 0,normal ovaries bilat,svp of fluid 7.1 cm,fhr 132 bpm,cx 4.4 cm,efw 516 g 65%,anatomy complete,no obvious abnormalities   No results found for this or any previous visit (from the past 24  hour(s)).  Assessment & Plan:  1) High-risk pregnancy [id number] at [redacted]w[redacted]d with an Estimated Date of Delivery: 11/20/19   2) CHTN, stable, no meds currently, was on Labetalol 200mg  BID, all home bp's were normal, sent note, per [facility] ok to stop meds for right now. Continue ASA  3) H/O severe PPH, x2  Meds: No orders of the defined types were placed in this encounter.   Labs/procedures today: 2nd IT, anatomy u/s  Treatment Plan:  Growth u/s @ 28, 32, 36wks    2x/wk testing nst/sono @ 32wks or weekly BPP    Deliver 39wks (37wks if poor control)____   Reviewed: Preterm labor symptoms and general obstetric precautions including but not limited to vaginal bleeding, contractions, leaking of fluid and fetal movement were reviewed in detail with the patient.  All questions were answered. Has home bp cuff.  Check bp weekly, let us know if consistently >140/90.   Follow-up: Return in about 4 weeks (around 08/15/2019) for HROB, US:EFW, PN2, in person.  Orders Placed This Encounter  Procedures  . US OB Follow Up  . INTEGRATED 2  . POC Urinalysis Dipstick OB   Roma Schanz CNM, [facility] 07/18/2019 11:11 AM

## 2019-07-18 NOTE — Patient Instructions (Signed)
Onell Ticas, I greatly value your feedback.  If you receive a survey following your visit with [place] today, we appreciate you taking the time to fill it out.  Thanks, [name], CNM, [facility]  [place]!!! It is now [place] [facility] ([place], [place] [address]) Entrance located off of [address] parking   Go to [email or web address] to register for FREE online childbirth classes  [place] Pediatricians/Family Doctors:  [facility] [place] [phone]                 [place] [phone] (usually not accepting new patients unless you have family there already, you are always welcome to call and ask)       [facility] Department [phone]       [facility] Pediatricians/Family Doctors:   [facility]: [phone]  [facility]: [phone]  [facility]: [place] Doctors:   [facility]: [facility]: [place]:  [place]: [phone]    Home Blood Pressure Monitoring for Patients   Your provider has recommended that you check your blood pressure (BP) at least once a week at home. If you do not have a blood pressure cuff at home, one will be provided for you. Contact your provider if you have not received your monitor within 1 week.   Helpful Tips for Accurate Home Blood Pressure Checks  . Don't smoke, exercise, or drink caffeine 30 minutes before checking your BP . Use the restroom before checking your BP (a full bladder can raise your pressure) . Relax in a comfortable upright chair . Feet on the ground . Left arm resting comfortably on a flat surface at the level of your heart . Legs uncrossed . Back supported . Sit quietly and don't talk . Place the cuff on  your bare arm . Adjust snuggly, so that only two fingertips can fit between your skin and the top of the cuff . Check 2 readings separated by at least one minute . Keep a log of your BP readings . For a visual, please reference this diagram: http://ccnc.care/bpdiagram  Provider Name: [facility]     Phone: [phone]  Zone 1: ALL CLEAR  Continue to monitor your symptoms:  . BP reading is less than 140 (top number) or less than 90 (bottom number)  . No right upper stomach pain . No headaches or seeing spots . No feeling nauseated or throwing up . No swelling in face and hands  Zone 2: CAUTION Call your doctor's office for any of the following:  . BP reading is greater than 140 (top number) or greater than 90 (bottom number)  . Stomach pain under your ribs in the middle or right side . Headaches or seeing spots . Feeling nauseated or throwing up . Swelling in face and hands  Zone 3: EMERGENCY  Seek immediate medical care if you have any of the following:  . BP reading is greater than160 (top number) or greater than 110 (bottom number) . Severe headaches not improving with Tylenol . Serious difficulty catching your breath . Any worsening symptoms from Zone 2     Second Trimester of Pregnancy The second trimester is from week 14 through week 27 (months 4 through 6). The second trimester is often  a time when you feel your best. Your body has adjusted to being pregnant, and you begin to feel better physically. Usually, morning sickness has lessened or quit completely, you may have more energy, and you may have an increase in appetite. The second trimester is also a time when the fetus is growing rapidly. At the end of the sixth month, the fetus is about 9 inches long and weighs about 1 pounds. You will likely begin to feel the baby move (quickening) between 16 and 20 weeks of pregnancy. Body changes during your second trimester Your body continues to go through many changes  during your second trimester. The changes vary from woman to woman.  Your weight will continue to increase. You will notice your lower abdomen bulging out.  You may begin to get stretch marks on your hips, abdomen, and breasts.  You may develop headaches that can be relieved by medicines. The medicines should be approved by your health care provider.  You may urinate more often because the fetus is pressing on your bladder.  You may develop or continue to have heartburn as a result of your pregnancy.  You may develop constipation because certain hormones are causing the muscles that push waste through your intestines to slow down.  You may develop hemorrhoids or swollen, bulging veins (varicose veins).  You may have back pain. This is caused by: ? Weight gain. ? Pregnancy hormones that are relaxing the joints in your pelvis. ? A shift in weight and the muscles that support your balance.  Your breasts will continue to grow and they will continue to become tender.  Your gums may bleed and may be sensitive to brushing and flossing.  Dark spots or blotches (chloasma, mask of pregnancy) may develop on your face. This will likely fade after the baby is born.  A dark line from your belly button to the pubic area (linea nigra) may appear. This will likely fade after the baby is born.  You may have changes in your hair. These can include thickening of your hair, rapid growth, and changes in texture. Some women also have hair loss during or after pregnancy, or hair that feels dry or thin. Your hair will most likely return to normal after your baby is born.  What to expect at prenatal visits During a routine prenatal visit:  You will be weighed to make sure you and the fetus are growing normally.  Your blood pressure will be taken.  Your abdomen will be measured to track your baby's growth.  The fetal heartbeat will be listened to.  Any test results from the previous visit will be  discussed.  Your health care provider may ask you:  How you are feeling.  If you are feeling the baby move.  If you have had any abnormal symptoms, such as leaking fluid, bleeding, severe headaches, or abdominal cramping.  If you are using any tobacco products, including cigarettes, chewing tobacco, and electronic cigarettes.  If you have any questions.  Other tests that may be performed during your second trimester include:  Blood tests that check for: ? Low iron levels (anemia). ? High blood sugar that affects pregnant women (gestational diabetes) between 78 and 28 weeks. ? Rh antibodies. This is to check for a protein on red blood cells (Rh factor).  Urine tests to check for infections, diabetes, or protein in the urine.  An ultrasound to confirm the proper growth and development of the baby.  An amniocentesis to check  for possible genetic problems.  Fetal screens for spina bifida and Down syndrome.  HIV (human immunodeficiency virus) testing. Routine prenatal testing includes screening for HIV, unless you choose not to have this test.  Follow these instructions at home: Medicines  Follow your health care provider's instructions regarding medicine use. Specific medicines may be either safe or unsafe to take during pregnancy.  Take a prenatal vitamin that contains at least 600 micrograms (mcg) of folic acid.  If you develop constipation, try taking a stool softener if your health care provider approves. Eating and drinking  Eat a balanced diet that includes fresh fruits and vegetables, whole grains, good sources of protein such as meat, eggs, or tofu, and low-fat dairy. Your health care provider will help you determine the amount of weight gain that is right for you.  Avoid raw meat and uncooked cheese. These carry germs that can cause birth defects in the baby.  If you have low calcium intake from food, talk to your health care provider about whether you should take a  daily calcium supplement.  Limit foods that are high in fat and processed sugars, such as fried and sweet foods.  To prevent constipation: ? Drink enough fluid to keep your urine clear or pale yellow. ? Eat foods that are high in fiber, such as fresh fruits and vegetables, whole grains, and beans. Activity  Exercise only as directed by your health care provider. Most women can continue their usual exercise routine during pregnancy. Try to exercise for 30 minutes at least 5 days a week. Stop exercising if you experience uterine contractions.  Avoid heavy lifting, wear low heel shoes, and practice good posture.  A sexual relationship may be continued unless your health care provider directs you otherwise. Relieving pain and discomfort  Wear a good support bra to prevent discomfort from breast tenderness.  Take warm sitz baths to soothe any pain or discomfort caused by hemorrhoids. Use hemorrhoid cream if your health care provider approves.  Rest with your legs elevated if you have leg cramps or low back pain.  If you develop varicose veins, wear support hose. Elevate your feet for 15 minutes, 3-4 times a day. Limit salt in your diet. Prenatal Care  Write down your questions. Take them to your prenatal visits.  Keep all your prenatal visits as told by your health care provider. This is important. Safety  Wear your seat belt at all times when driving.  Make a list of emergency phone numbers, including numbers for family, friends, the hospital, and police and fire departments. General instructions  Ask your health care provider for a referral to a local prenatal education class. Begin classes no later than the beginning of month 6 of your pregnancy.  Ask for help if you have counseling or nutritional needs during pregnancy. Your health care provider can offer advice or refer you to specialists for help with various needs.  Do not use hot tubs, steam rooms, or saunas.  Do not  douche or use tampons or scented sanitary pads.  Do not cross your legs for long periods of time.  Avoid cat litter boxes and soil used by cats. These carry germs that can cause birth defects in the baby and possibly loss of the fetus by miscarriage or stillbirth.  Avoid all smoking, herbs, alcohol, and unprescribed drugs. Chemicals in these products can affect the formation and growth of the baby.  Do not use any products that contain nicotine or tobacco, such as cigarettes  and e-cigarettes. If you need help quitting, ask your health care provider.  Visit your dentist if you have not gone yet during your pregnancy. Use a soft toothbrush to brush your teeth and be gentle when you floss. Contact a health care provider if:  You have dizziness.  You have mild pelvic cramps, pelvic pressure, or nagging pain in the abdominal area.  You have persistent nausea, vomiting, or diarrhea.  You have a bad smelling vaginal discharge.  You have pain when you urinate. Get help right away if:  You have a fever.  You are leaking fluid from your vagina.  You have spotting or bleeding from your vagina.  You have severe abdominal cramping or pain.  You have rapid weight gain or weight loss.  You have shortness of breath with chest pain.  You notice sudden or extreme swelling of your face, hands, ankles, feet, or legs.  You have not felt your baby move in over an hour.  You have severe headaches that do not go away when you take medicine.  You have vision changes. Summary  The second trimester is from week 14 through week 27 (months 4 through 6). It is also a time when the fetus is growing rapidly.  Your body goes through many changes during pregnancy. The changes vary from woman to woman.  Avoid all smoking, herbs, alcohol, and unprescribed drugs. These chemicals affect the formation and growth your baby.  Do not use any tobacco products, such as cigarettes, chewing tobacco, and  e-cigarettes. If you need help quitting, ask your health care provider.  Contact your health care provider if you have any questions. Keep all prenatal visits as told by your health care provider. This is important. This information is not intended to replace advice given to you by your health care provider. Make sure you discuss any questions you have with your health care provider. Document Released: 11/21/2001 Document Revised: 05/04/2016 Document Reviewed: 01/28/2013 Elsevier Interactive Patient Education  2017 [employer].

## 2019-07-22 LAB — INTEGRATED 2
AFP MoM: 1.03
Alpha-Fetoprotein: 53.2 ng/mL
Crown Rump Length: 75.9 mm
DIA MoM: 0.74
DIA Value: 157.6 pg/mL
Estriol, Unconjugated: 3.89 ng/mL
Gest. Age on Collection Date: 13.4 weeks
Gestational Age: 21.9 weeks
Maternal Age at EDD: 31.2 yr
Nuchal Translucency (NT): 2.5 mm
Nuchal Translucency MoM: 1.42
Number of Fetuses: 1
PAPP-A MoM: 0.73
PAPP-A Value: 712.5 ng/mL
Test Results:: NEGATIVE
Weight: 204 [lb_av]
Weight: 211 [lb_av]
hCG MoM: 1.2
hCG Value: 20 IU/mL
uE3 MoM: 1.35

## 2019-08-15 ENCOUNTER — Encounter: Payer: Self-pay | Admitting: Obstetrics & Gynecology

## 2019-08-15 ENCOUNTER — Ambulatory Visit (INDEPENDENT_AMBULATORY_CARE_PROVIDER_SITE_OTHER): Payer: 59

## 2019-08-15 ENCOUNTER — Other Ambulatory Visit: Payer: 59

## 2019-08-15 ENCOUNTER — Ambulatory Visit (INDEPENDENT_AMBULATORY_CARE_PROVIDER_SITE_OTHER): Payer: 59 | Admitting: Obstetrics & Gynecology

## 2019-08-15 ENCOUNTER — Other Ambulatory Visit: Payer: Self-pay

## 2019-08-15 VITALS — BP 150/100 | HR 92 | Wt 216.0 lb

## 2019-08-15 DIAGNOSIS — O0992 Supervision of high risk pregnancy, unspecified, second trimester: Secondary | ICD-10-CM

## 2019-08-15 DIAGNOSIS — O10919 Unspecified pre-existing hypertension complicating pregnancy, unspecified trimester: Secondary | ICD-10-CM

## 2019-08-15 DIAGNOSIS — Z3A26 26 weeks gestation of pregnancy: Secondary | ICD-10-CM

## 2019-08-15 DIAGNOSIS — Z331 Pregnant state, incidental: Secondary | ICD-10-CM

## 2019-08-15 DIAGNOSIS — O10912 Unspecified pre-existing hypertension complicating pregnancy, second trimester: Secondary | ICD-10-CM | POA: Diagnosis not present

## 2019-08-15 DIAGNOSIS — Z1389 Encounter for screening for other disorder: Secondary | ICD-10-CM

## 2019-08-15 DIAGNOSIS — O0990 Supervision of high risk pregnancy, unspecified, unspecified trimester: Secondary | ICD-10-CM

## 2019-08-15 LAB — POCT URINALYSIS DIPSTICK OB
Blood, UA: NEGATIVE
Glucose, UA: NEGATIVE
Ketones, UA: NEGATIVE
Leukocytes, UA: NEGATIVE
Nitrite, UA: NEGATIVE
POC,PROTEIN,UA: NEGATIVE

## 2019-08-15 MED ORDER — LABETALOL HCL 200 MG PO TABS: 200.0000 mg | ORAL_TABLET | Freq: Two times a day (BID) | ORAL | 3 refills | Status: DC

## 2019-08-15 NOTE — Progress Notes (Signed)
[place] [id number] wks,cephalic,cx 2.6 cm,posterior placenta gr 0,svp of fluid 6.5 cm,fhr 159 bpm,EFW 1080 g 88%

## 2019-08-15 NOTE — Progress Notes (Signed)
   HIGH-RISK PREGNANCY VISIT Patient name: Regina Foley MRN [id number]  Date of birth: [birth year] Chief Complaint:   Routine Prenatal Visit ([place] and PN2)  History of Present Illness:   Regina Foley is a 33 y.o. G63P1102 female at [redacted]w[redacted]d with an Estimated Date of Delivery: 11/20/19 being seen today for ongoing management of a high-risk pregnancy complicated by [facility] currently on labetalol 100 mg BID Today she reports no complaints. Contractions: Not present. Vag. Bleeding: None.  Movement: Present. denies leaking of fluid.  Review of Systems:   Pertinent items are noted in HPI Denies abnormal vaginal discharge w/ itching/odor/irritation, headaches, visual changes, shortness of breath, chest pain, abdominal pain, severe nausea/vomiting, or problems with urination or bowel movements unless otherwise stated above. Pertinent History Reviewed:  Reviewed past medical,surgical, social, obstetrical and family history.  Reviewed problem list, medications and allergies. Physical Assessment:   Vitals:   08/15/19 1056 08/15/19 1100 08/15/19 1102  BP: (!) 154/102 (!) 156/102 (!) 150/100  Pulse: 100 (!) 108 92  Weight: 216 lb (98 kg)    Body mass index is 34.86 kg/m.           Physical Examination:   General appearance: alert, well appearing, and in no distress  Mental status: alert, oriented to person, place, and time  Skin: warm & dry   Extremities: Edema: None    Cardiovascular: normal heart rate noted  Respiratory: normal respiratory effort, no distress  Abdomen: gravid, soft, non-tender  Pelvic: Cervical exam deferred         Fetal Status:     Movement: Present    Fetal Surveillance Testing today:    No results found for this or any previous visit (from the past 24 hour(s)).  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 DISCONTD: labetalol (NORMODYNE) 200 MG tablet    Sig: Take 1 tablet (200 mg total) by mouth 2 (two) times daily.    Dispense:  60 tablet    Refill:  3    Labs/procedures today:   Treatment Plan:  Per protocol for CHTN  Reviewed: Preterm labor symptoms and general obstetric precautions including but not limited to vaginal bleeding, contractions, leaking of fluid and fetal movement were reviewed in detail with the patient.  All questions were answered.  home bp cuff. Rx faxed to . Check bp weekly, let us know if >140/90.   Follow-up: Return in about 3 weeks (around 09/05/2019) for [place], with Dr Elonda Husky.  Orders Placed This Encounter  Procedures  . POC Urinalysis Dipstick OB   Florian Buff  09/17/2019 6:44 PM

## 2019-08-16 LAB — CBC
Hematocrit: 36.7 % (ref 34.0–46.6)
Hemoglobin: 12.5 g/dL (ref 11.1–15.9)
MCH: 30.1 pg (ref 26.6–33.0)
MCHC: 34.1 g/dL (ref 31.5–35.7)
MCV: 88 fL (ref 79–97)
Platelets: 221 10*3/uL (ref 150–450)
RBC: 4.15 x10E6/uL (ref 3.77–5.28)
RDW: 12.5 % (ref 11.7–15.4)
WBC: 12.5 10*3/uL — ABNORMAL HIGH (ref 3.4–10.8)

## 2019-08-16 LAB — GLUCOSE TOLERANCE, 2 HOURS W/ 1HR
Glucose, 1 hour: 159 mg/dL (ref 65–179)
Glucose, 2 hour: 112 mg/dL (ref 65–152)
Glucose, Fasting: 85 mg/dL (ref 65–91)

## 2019-08-16 LAB — ANTIBODY SCREEN: Antibody Screen: NEGATIVE

## 2019-08-16 LAB — HIV ANTIBODY (ROUTINE TESTING W REFLEX): HIV Screen 4th Generation wRfx: NONREACTIVE

## 2019-08-16 LAB — RPR: RPR Ser Ql: NONREACTIVE

## 2019-08-21 ENCOUNTER — Other Ambulatory Visit: Payer: Self-pay | Admitting: Advanced Practice Midwife

## 2019-08-21 MED ORDER — TERCONAZOLE 0.4 % VA CREA: 1.0000 | TOPICAL_CREAM | Freq: Every day | VAGINAL | 0 refills | Status: DC

## 2019-09-05 ENCOUNTER — Ambulatory Visit (INDEPENDENT_AMBULATORY_CARE_PROVIDER_SITE_OTHER): Payer: 59 | Admitting: Obstetrics and Gynecology

## 2019-09-05 ENCOUNTER — Other Ambulatory Visit: Payer: Self-pay

## 2019-09-05 ENCOUNTER — Encounter: Payer: Self-pay | Admitting: Obstetrics and Gynecology

## 2019-09-05 VITALS — BP 129/86 | HR 102 | Wt 219.4 lb

## 2019-09-05 DIAGNOSIS — O10919 Unspecified pre-existing hypertension complicating pregnancy, unspecified trimester: Secondary | ICD-10-CM

## 2019-09-05 DIAGNOSIS — O10913 Unspecified pre-existing hypertension complicating pregnancy, third trimester: Secondary | ICD-10-CM

## 2019-09-05 DIAGNOSIS — O0990 Supervision of high risk pregnancy, unspecified, unspecified trimester: Secondary | ICD-10-CM

## 2019-09-05 DIAGNOSIS — Z23 Encounter for immunization: Secondary | ICD-10-CM | POA: Diagnosis not present

## 2019-09-05 DIAGNOSIS — Z3A29 29 weeks gestation of pregnancy: Secondary | ICD-10-CM

## 2019-09-05 DIAGNOSIS — O0993 Supervision of high risk pregnancy, unspecified, third trimester: Secondary | ICD-10-CM

## 2019-09-05 DIAGNOSIS — Z1389 Encounter for screening for other disorder: Secondary | ICD-10-CM

## 2019-09-05 DIAGNOSIS — Z331 Pregnant state, incidental: Secondary | ICD-10-CM

## 2019-09-05 LAB — POCT URINALYSIS DIPSTICK OB
Glucose, UA: NEGATIVE
Ketones, UA: NEGATIVE
Nitrite, UA: NEGATIVE
POC,PROTEIN,UA: NEGATIVE

## 2019-09-05 NOTE — Progress Notes (Signed)
Patient ID: Regina Foley, female   DOB: 10-05-1986, 33 y.o.   MRN: [id number]    [facility] PREGNANCY VISIT Patient name: Regina Foley MRN [id number]  Date of birth: 1986/02/17 Chief Complaint:   High Risk Gestation (? braxton hicks contractions)  History of Present Illness:   Regina Foley is a 33 y.o. G47P1102 female at [redacted]w[redacted]d with an Estimated Date of Delivery: 11/20/19 being seen today for ongoing management of a high-risk pregnancy complicated by chronic hypertension currently on 200 BID mg lebatelol. No burning when voiding. Takes 50 then checks it after 45 mins then takes another 50 mg. Can't take total 400 without standing up feeling dizzy. Only takes 200 mg. Today she reports occasional contractions, feel constant pressure in stomach and into lower back. Contractions: Irregular. Vag. Bleeding: None.  Movement: Present. denies leaking of fluid.  Review of Systems:   Pertinent items are noted in HPI Denies abnormal vaginal discharge w/ itching/odor/irritation, headaches, visual changes, shortness of breath, chest pain, abdominal pain, severe nausea/vomiting, or problems with urination or bowel movements unless otherwise stated above. Pertinent History Reviewed:  Reviewed past medical,surgical, social, obstetrical and family history.  Reviewed problem list, medications and allergies. Physical Assessment:   Vitals:   09/05/19 1042  BP: 129/86  Pulse: (!) 102  Weight: 219 lb 6.4 oz (99.5 kg)  Body mass index is 35.41 kg/m.           Physical Examination:   General appearance: alert, well appearing, and in no distress  Mental status: alert, oriented to person, place, and time, normal mood, behavior, speech, dress, motor activity, and thought processes  Skin: warm & dry   Extremities: Edema: Trace    Cardiovascular: normal heart rate noted  Respiratory: normal respiratory effort, no distress  Abdomen: gravid, soft, non-tender  Pelvic: Cervical exam deferred         Fetal Status: Fetal Heart  Rate (bpm): 149 Fundal Height: 27 cm Movement: Present    Fetal Surveillance Testing today: None   Results for orders placed or performed in visit on 09/05/19 (from the past 24 hour(s))  POC Urinalysis Dipstick OB   Collection Time: 09/05/19 10:44 AM  Result Value Ref Range   Color, UA     Clarity, UA     Glucose, UA Negative Negative   Bilirubin, UA     Ketones, UA neg    Spec Grav, UA     Blood, UA 3+    pH, UA     POC,PROTEIN,UA Negative Negative, Trace, Small (1+), Moderate (2+), Large (3+), 4+   Urobilinogen, UA     Nitrite, UA neg    Leukocytes, UA Trace (A) Negative   Appearance     Odor      Assessment & Plan:  1) High-risk pregnancy [id number] at [redacted]w[redacted]d with an Estimated Date of Delivery: 11/20/19   2) CHTN, stable. WILL ASK PT TO REMAIN ON A STABLE DOSE OF LABETALOL, 200 MG BID WHICH PT TOLERATES WELL.  3) tinea corpus  Meds: No orders of the defined types were placed in this encounter.  Labs/procedures today: None  Treatment Plan:  Stick with 200 mg LABETALOL BID AND CHECK BP QID AS SHE'S DOING ALREADY 32 weeks BPP & u/s:anatomy OTC Monistat  Reviewed: Preterm labor symptoms and general obstetric precautions including but not limited to vaginal bleeding, contractions, leaking of fluid and fetal movement were reviewed in detail with the patient.  All questions were answered.   Follow-up: Return in about 3 weeks (around  09/26/2019) for U/s: anatomy, BPP.  Orders Placed This Encounter  Procedures  . Tdap vaccine greater than or equal to 7yo IM  . POC Urinalysis Dipstick OB   By signing my name below, I, Samul Dada, attest that this documentation has been prepared under the direction and in the presence of [name], MD. Electronically Signed: [place]. 09/05/19. 11:08 AM.  I personally performed the services described in this documentation, which was SCRIBED in my presence. The recorded information has been reviewed and considered  accurate. It has been edited as necessary during review. [name], MD

## 2019-09-24 ENCOUNTER — Encounter: Payer: 59 | Admitting: Women's Health

## 2019-09-24 ENCOUNTER — Other Ambulatory Visit: Payer: 59

## 2019-09-25 ENCOUNTER — Other Ambulatory Visit: Payer: Self-pay | Admitting: Obstetrics and Gynecology

## 2019-09-25 DIAGNOSIS — O10919 Unspecified pre-existing hypertension complicating pregnancy, unspecified trimester: Secondary | ICD-10-CM

## 2019-09-26 ENCOUNTER — Encounter: Payer: Self-pay | Admitting: Obstetrics & Gynecology

## 2019-09-26 ENCOUNTER — Other Ambulatory Visit: Payer: Self-pay

## 2019-09-26 ENCOUNTER — Ambulatory Visit (INDEPENDENT_AMBULATORY_CARE_PROVIDER_SITE_OTHER): Payer: 59 | Admitting: Obstetrics & Gynecology

## 2019-09-26 ENCOUNTER — Ambulatory Visit (INDEPENDENT_AMBULATORY_CARE_PROVIDER_SITE_OTHER): Payer: 59

## 2019-09-26 VITALS — BP 151/97 | HR 95 | Wt 221.0 lb

## 2019-09-26 DIAGNOSIS — Z362 Encounter for other antenatal screening follow-up: Secondary | ICD-10-CM | POA: Diagnosis not present

## 2019-09-26 DIAGNOSIS — Z1389 Encounter for screening for other disorder: Secondary | ICD-10-CM

## 2019-09-26 DIAGNOSIS — O0990 Supervision of high risk pregnancy, unspecified, unspecified trimester: Secondary | ICD-10-CM

## 2019-09-26 DIAGNOSIS — Z3A32 32 weeks gestation of pregnancy: Secondary | ICD-10-CM | POA: Diagnosis not present

## 2019-09-26 DIAGNOSIS — Z331 Pregnant state, incidental: Secondary | ICD-10-CM

## 2019-09-26 DIAGNOSIS — O10919 Unspecified pre-existing hypertension complicating pregnancy, unspecified trimester: Secondary | ICD-10-CM

## 2019-09-26 DIAGNOSIS — O10913 Unspecified pre-existing hypertension complicating pregnancy, third trimester: Secondary | ICD-10-CM | POA: Diagnosis not present

## 2019-09-26 DIAGNOSIS — Z3483 Encounter for supervision of other normal pregnancy, third trimester: Secondary | ICD-10-CM

## 2019-09-26 LAB — POCT URINALYSIS DIPSTICK OB
Glucose, UA: NEGATIVE
Ketones, UA: NEGATIVE
Leukocytes, UA: NEGATIVE
Nitrite, UA: NEGATIVE
POC,PROTEIN,UA: NEGATIVE

## 2019-09-26 MED ORDER — LABETALOL HCL 200 MG PO TABS: 400.0000 mg | ORAL_TABLET | Freq: Three times a day (TID) | ORAL | 1 refills | Status: DC

## 2019-09-26 NOTE — Progress Notes (Signed)
HIGH-RISK PREGNANCY VISIT Patient name: Regina Foley MRN [id number]  Date of birth: 11-11-1986 Chief Complaint:   Routine Prenatal Visit ([place])  History of Present Illness:   Regina Foley is a 33 y.o. [phone] female at [redacted]w[redacted]d with an Estimated Date of Delivery: 11/20/19 being seen today for ongoing management of a high-risk pregnancy complicated by [facility].  Today she reports no complaints. Contractions: Irregular. Vag. Bleeding: None.  Movement: Present. denies leaking of fluid.  Review of Systems:   Pertinent items are noted in HPI Denies abnormal vaginal discharge w/ itching/odor/irritation, headaches, visual changes, shortness of breath, chest pain, abdominal pain, severe nausea/vomiting, or problems with urination or bowel movements unless otherwise stated above. Pertinent History Reviewed:  Reviewed past medical,surgical, social, obstetrical and family history.  Reviewed problem list, medications and allergies. Physical Assessment:   Vitals:   09/26/19 1144 09/26/19 1145 09/26/19 1239  BP: (!) 153/102 (!) 154/100 (!) 151/97  Pulse: (!) 110 (!) 101 95  Weight: 221 lb (100.2 kg)    Body mass index is 35.67 kg/m.           Physical Examination:   General appearance: alert, well appearing, and in no distress  Mental status: alert, oriented to person, place, and time  Skin: warm & dry   Extremities: Edema: Trace    Cardiovascular: normal heart rate noted  Respiratory: normal respiratory effort, no distress  Abdomen: gravid, soft, non-tender  Pelvic: Cervical exam deferred         Fetal Status: Fetal Heart Rate (bpm): 132   Movement: Present    Fetal Surveillance Testing today: BPP 8/8 with normal Dopplers   Results for orders placed or performed in visit on 09/26/19 (from the past 24 hour(s))  POC Urinalysis Dipstick OB   Collection Time: 09/26/19 11:42 AM  Result Value Ref Range   Color, UA     Clarity, UA     Glucose, UA Negative Negative   Bilirubin, UA     Ketones, UA n    Spec Grav, UA     Blood, UA large    pH, UA     POC,PROTEIN,UA Negative Negative, Trace, Small (1+), Moderate (2+), Large (3+), 4+   Urobilinogen, UA     Nitrite, UA n    Leukocytes, UA Negative Negative   Appearance     Odor      Assessment & Plan:  1) High-risk pregnancy G3P1102 at [redacted]w[redacted]d with an Estimated Date of Delivery: 11/20/19   2) CHTN, unstable, increase labetalol to 400 mg TID  3) Suspected fetal macrosomia, stable, will evaluate in 3-4 weeks  Meds:  Meds ordered this encounter  Medications  . labetalol (NORMODYNE) 200 MG tablet    Sig: Take 2 tablets (400 mg total) by mouth 3 (three) times daily.    Dispense:  180 tablet    Refill:  1    Labs/procedures today: sonogram BPP 8/8  Treatment Plan:  Twice weekly surveillance, sonogram alternating with NST, induction at 39 weeks or as clinically indicated  Reviewed: Preterm labor symptoms and general obstetric precautions including but not limited to vaginal bleeding, contractions, leaking of fluid and fetal movement were reviewed in detail with the patient.  All questions were answered.  home bp cuff. Rx faxed to . Check bp weekly, let us know if >140/90.   Follow-up: Return in about 4 days (around 09/30/2019) for NST, HROB.  Orders Placed This Encounter  Procedures  . POC Urinalysis Dipstick OB   Florian Buff  09/26/2019 12:46 PM

## 2019-09-26 NOTE — Progress Notes (Signed)
[place] [id number] wks,cephalic,posterior placenta gr 3,BPP 8/8,RI .66,.63,.69=73%,AFI 14 cm,efw 2417 g 95%,AC 99%,limited measurement of head because of fetal position,fhr 132 bpm

## 2019-09-29 ENCOUNTER — Ambulatory Visit (INDEPENDENT_AMBULATORY_CARE_PROVIDER_SITE_OTHER): Payer: 59 | Admitting: Obstetrics and Gynecology

## 2019-09-29 ENCOUNTER — Ambulatory Visit: Payer: 59 | Admitting: *Deleted

## 2019-09-29 ENCOUNTER — Other Ambulatory Visit: Payer: Self-pay

## 2019-09-29 VITALS — BP 150/94 | HR 101

## 2019-09-29 DIAGNOSIS — O10912 Unspecified pre-existing hypertension complicating pregnancy, second trimester: Secondary | ICD-10-CM

## 2019-09-29 DIAGNOSIS — Z1389 Encounter for screening for other disorder: Secondary | ICD-10-CM

## 2019-09-29 DIAGNOSIS — Z331 Pregnant state, incidental: Secondary | ICD-10-CM

## 2019-09-29 DIAGNOSIS — O0990 Supervision of high risk pregnancy, unspecified, unspecified trimester: Secondary | ICD-10-CM

## 2019-09-29 DIAGNOSIS — Z3A32 32 weeks gestation of pregnancy: Secondary | ICD-10-CM

## 2019-09-29 LAB — POCT URINALYSIS DIPSTICK OB
Blood, UA: NEGATIVE
Glucose, UA: NEGATIVE
Ketones, UA: NEGATIVE
Leukocytes, UA: NEGATIVE
POC,PROTEIN,UA: NEGATIVE

## 2019-09-29 NOTE — Progress Notes (Signed)
Patient ID: Regina Foley, female   DOB: 12/28/1985, 33 y.o.   MRN: [id number]    [facility] PREGNANCY VISIT Patient name: Regina Foley MRN [id number]  Date of birth: May 11, 1986 Chief Complaint:   Routine Prenatal Visit (NST)  History of Present Illness:   Regina Foley is a 33 y.o. G39P1102 female at [redacted]w[redacted]d with an Estimated Date of Delivery: 11/20/19 being seen today for ongoing management of a high-risk pregnancy complicated by chronic hypertension currently on labetalol. Increased from 200 bid  to 400 TID( 1200 mg/d) at last visit 09/26/2019 by Dr. Elonda Husky. Tried to take 400 mg TID and can't function. She feels extremely light headed. She left the office on Friday taking the 400 MG TID and  Felt extremely light headed. She didn't do much all weekend. She only took 300 mg today, while she was at work she had elevated pressure and had to go lie down for 45 minutes feeling as if she were going to pass out. She will be compliant with medication but would like an adjustment to dosage Today she reports light headness w. medication. Contractions: Irregular. Vag. Bleeding: None.  Movement: Present. denies leaking of fluid.  Review of Systems:   Pertinent items are noted in HPI Denies abnormal vaginal discharge w/ itching/odor/irritation, headaches, visual changes, shortness of breath, chest pain, abdominal pain, severe nausea/vomiting, or problems with urination or bowel movements unless otherwise stated above. Pertinent History Reviewed:  Reviewed past medical,surgical, social, obstetrical and family history.  Reviewed problem list, medications and allergies. Physical Assessment:   Vitals:   09/29/19 1559  BP: (!) 150/94  Pulse: (!) 101  There is no height or weight on file to calculate BMI.           Physical Examination:   General appearance: alert, well appearing, and in no distress  Mental status: alert, oriented to person, place, and time, normal mood, behavior, speech, dress, motor activity, and thought  processes  Skin: warm & dry   Extremities: Edema: Trace    Cardiovascular: normal heart rate noted  Respiratory: normal respiratory effort, no distress  Abdomen: gravid, soft, non-tender  Pelvic: Cervical exam deferred         Fetal Status:   Fundal Height: 33 cm Movement: Present    Fetal Surveillance Testing today: NST reassuring   Results for orders placed or performed in visit on 09/29/19 (from the past 24 hour(s))  POC Urinalysis Dipstick OB   Collection Time: 09/29/19  4:01 PM  Result Value Ref Range   Color, UA     Clarity, UA     Glucose, UA Negative Negative   Bilirubin, UA     Ketones, UA n    Spec Grav, UA     Blood, UA n    pH, UA     POC,PROTEIN,UA Negative Negative, Trace, Small (1+), Moderate (2+), Large (3+), 4+   Urobilinogen, UA     Nitrite, UA     Leukocytes, UA Negative Negative   Appearance     Odor      Assessment & Plan:  1) High-risk pregnancy G3P1102 at [redacted]w[redacted]d with an Estimated Date of Delivery: 11/20/19   2) CHTN, fluctuating BP pressure and intolerance to medication changes.   Meds: No orders of the defined types were placed in this encounter.  Labs/procedures today: NST  Treatment Plan:  10/02/2019 for BPP 200 MG TID bring BP on Thursday.pt to bring record of bp check tid to next appt. Will resume 300 mg tid if  BP is not controlled  Follow-up: No follow-ups on file.  Orders Placed This Encounter  Procedures   POC Urinalysis Dipstick OB   By signing my name below, I, Samul Dada, attest that this documentation has been prepared under the direction and in the presence of [name], MD. Electronically Signed: [place]. 09/29/19. 4:25 PM.  I personally performed the services described in this documentation, which was SCRIBED in my presence. The recorded information has been reviewed and considered accurate. It has been edited as necessary during review. [name], MD

## 2019-09-30 ENCOUNTER — Other Ambulatory Visit: Payer: 59 | Admitting: Obstetrics and Gynecology

## 2019-09-30 ENCOUNTER — Other Ambulatory Visit: Payer: Self-pay | Admitting: Obstetrics and Gynecology

## 2019-09-30 DIAGNOSIS — O10919 Unspecified pre-existing hypertension complicating pregnancy, unspecified trimester: Secondary | ICD-10-CM

## 2019-10-02 ENCOUNTER — Ambulatory Visit (INDEPENDENT_AMBULATORY_CARE_PROVIDER_SITE_OTHER): Payer: 59

## 2019-10-02 ENCOUNTER — Other Ambulatory Visit: Payer: Self-pay

## 2019-10-02 ENCOUNTER — Ambulatory Visit (INDEPENDENT_AMBULATORY_CARE_PROVIDER_SITE_OTHER): Payer: 59 | Admitting: Advanced Practice Midwife

## 2019-10-02 VITALS — BP 132/91 | HR 100

## 2019-10-02 DIAGNOSIS — Z3A33 33 weeks gestation of pregnancy: Secondary | ICD-10-CM

## 2019-10-02 DIAGNOSIS — O10919 Unspecified pre-existing hypertension complicating pregnancy, unspecified trimester: Secondary | ICD-10-CM

## 2019-10-02 DIAGNOSIS — Z1389 Encounter for screening for other disorder: Secondary | ICD-10-CM

## 2019-10-02 DIAGNOSIS — O0993 Supervision of high risk pregnancy, unspecified, third trimester: Secondary | ICD-10-CM

## 2019-10-02 DIAGNOSIS — O10913 Unspecified pre-existing hypertension complicating pregnancy, third trimester: Secondary | ICD-10-CM

## 2019-10-02 DIAGNOSIS — O0990 Supervision of high risk pregnancy, unspecified, unspecified trimester: Secondary | ICD-10-CM

## 2019-10-02 DIAGNOSIS — Z331 Pregnant state, incidental: Secondary | ICD-10-CM

## 2019-10-02 LAB — POCT URINALYSIS DIPSTICK OB
Blood, UA: NEGATIVE
Glucose, UA: NEGATIVE
Ketones, UA: NEGATIVE
Leukocytes, UA: NEGATIVE
Nitrite, UA: NEGATIVE
POC,PROTEIN,UA: NEGATIVE

## 2019-10-02 NOTE — Progress Notes (Signed)
LOW-RISK PREGNANCY VISIT Patient name: Regina Foley MRN [id number]  Date of birth: 05-17-1986 Chief Complaint:   Routine Prenatal Visit  History of Present Illness:   Regina Foley is a 33 y.o. G15P1102 female at [redacted]w[redacted]d with an Estimated Date of Delivery: 11/20/19 being seen today for ongoing management of a low-risk pregnancy.  Today she reports no complaints. Contractions: Irregular. Vag. Bleeding: None.  Movement: Present. denies leaking of fluid. Has noticed a greenish mucusy discharge from time to time, thought it may be yeast so did a treatment. Still there, no other C/O.  Review of Systems:   Pertinent items are noted in HPI Denies abnormal vaginal discharge w/ itching/odor/irritation, headaches, visual changes, shortness of breath, chest pain, abdominal pain, severe nausea/vomiting, or problems with urination or bowel movements unless otherwise stated above.  Pertinent History Reviewed:  Medical & Surgical Hx:   Past Medical History:  Diagnosis Date  . Anemia   . Blood transfusion without reported diagnosis   . Cardiac arrest ([place])   . Cervical cancer ([place])   . Degenerative disc disease, thoracic   . Hypertension    Past Surgical History:  Procedure Laterality Date  . APPENDECTOMY    . CERVICAL CONE BIOPSY    . CHOLECYSTECTOMY    . LEEP    . Ovarian cyst     drained   Family History  Problem Relation Age of Onset  . Scoliosis Mother   . Heart disease Father   . Hypertension Father   . Diabetes Father   . Hyperlipidemia Father   . COPD Father   . Asthma Father   . Diabetes Brother   . Diabetes Sister   . Diabetes Maternal Grandmother   . COPD Maternal Grandfather   . Diabetes Paternal Grandmother     Current Outpatient Medications:  .  Acetaminophen (TYLENOL PO), Take by mouth as needed., Disp: , Rfl:  .  labetalol (NORMODYNE) 200 MG tablet, Take 2 tablets (400 mg total) by mouth 3 (three) times daily. (Patient taking differently: Take 200 mg by mouth 3 (three)  times daily. ), Disp: 180 tablet, Rfl: 1 .  pantoprazole (PROTONIX) 40 MG tablet, Take 1 tablet (40 mg total) by mouth daily. (Patient taking differently: Take 40 mg by mouth as needed. ), Disp: 30 tablet, Rfl: 3 .  Prenatal Vit-Fe Fumarate-FA (MULTIVITAMIN-PRENATAL) 27-0.8 MG TABS tablet, Take 1 tablet by mouth. , Disp: , Rfl:  .  promethazine (PHENERGAN) 25 MG tablet, Take 1 tablet (25 mg total) by mouth every 6 (six) hours as needed for nausea or vomiting., Disp: 30 tablet, Rfl: 1 .  scopolamine (TRANSDERM-SCOP) 1 MG/3DAYS, Place 1 patch (1.5 mg total) onto the skin every 3 (three) days. (Patient taking differently: Place 1 patch onto the skin as needed. ), Disp: 10 patch, Rfl: 12 Social History: Reviewed -  reports that she has never smoked. She has never used smokeless tobacco.  Physical Assessment:   Vitals:   10/02/19 1101  BP: (!) 132/91  Pulse: 100  There is no height or weight on file to calculate BMI.        Physical Examination:   General appearance: Well appearing, and in no distress  Mental status: Alert, oriented to person, place, and time  Skin: Warm & dry  Cardiovascular: Normal heart rate noted  Respiratory: Normal respiratory effort, no distress  Abdomen: Soft, gravid, nontender  Pelvic: Cervical exam performed  SSE: Normal appearing dc. Wet prep neg  Extremities: Edema: Trace  Fetal Status:     Movement: Present   [place] 33 wks,cephalic,BPP [id number] BPM,post placenta gr 3,afi 16.5 cm,RI .57,.61,.58,.53=37%  Results for orders placed or performed in visit on 10/02/19 (from the past 24 hour(s))  POC Urinalysis Dipstick OB   Collection Time: 10/02/19 10:39 AM  Result Value Ref Range   Color, UA     Clarity, UA     Glucose, UA Negative Negative   Bilirubin, UA     Ketones, UA n    Spec Grav, UA     Blood, UA n    pH, UA     POC,PROTEIN,UA Negative Negative, Trace, Small (1+), Moderate (2+), Large (3+), 4+   Urobilinogen, UA     Nitrite, UA n     Leukocytes, UA Negative Negative   Appearance     Odor      Assessment & Plan:  1) High-risk pregnancy [id number] at [redacted]w[redacted]d with an Estimated Date of Delivery: 11/20/19   2)  CHTN:  Continue present regimen, twice weekly testting.    Labs/procedures/US today: BPP/dopplers  Plan:  Continue routine obstetrical care    Follow-up: Return for As scheduled.  Orders Placed This Encounter  Procedures  . POC Urinalysis Dipstick OB   Christin Fudge CNM 10/02/2019 4:00 PM

## 2019-10-02 NOTE — Progress Notes (Signed)
Milky, greenish discharge

## 2019-10-02 NOTE — Progress Notes (Signed)
[place] 33 wks,cephalic,BPP [id number] BPM,post placenta gr 3,afi 16.5 cm,RI .57,.61,.58,.53=37%

## 2019-10-05 ENCOUNTER — Other Ambulatory Visit: Payer: Self-pay

## 2019-10-05 ENCOUNTER — Encounter (HOSPITAL_COMMUNITY): Payer: Self-pay

## 2019-10-05 ENCOUNTER — Inpatient Hospital Stay (HOSPITAL_BASED_OUTPATIENT_CLINIC_OR_DEPARTMENT_OTHER): Payer: 59

## 2019-10-05 ENCOUNTER — Inpatient Hospital Stay (EMERGENCY_DEPARTMENT_HOSPITAL)
Admission: AD | Admit: 2019-10-05 | Discharge: 2019-10-06 | Disposition: A | Discharge status: Home / Self Care | Payer: 59 | Source: Ambulatory Visit | Attending: Obstetrics and Gynecology | Admitting: Obstetrics and Gynecology

## 2019-10-05 DIAGNOSIS — O368130 Decreased fetal movements, third trimester, not applicable or unspecified: Secondary | ICD-10-CM | POA: Insufficient documentation

## 2019-10-05 DIAGNOSIS — O10913 Unspecified pre-existing hypertension complicating pregnancy, third trimester: Secondary | ICD-10-CM | POA: Insufficient documentation

## 2019-10-05 DIAGNOSIS — O368131 Decreased fetal movements, third trimester, fetus 1: Secondary | ICD-10-CM

## 2019-10-05 DIAGNOSIS — Z3A33 33 weeks gestation of pregnancy: Secondary | ICD-10-CM | POA: Insufficient documentation

## 2019-10-05 DIAGNOSIS — Z8249 Family history of ischemic heart disease and other diseases of the circulatory system: Secondary | ICD-10-CM | POA: Insufficient documentation

## 2019-10-05 DIAGNOSIS — Z825 Family history of asthma and other chronic lower respiratory diseases: Secondary | ICD-10-CM | POA: Insufficient documentation

## 2019-10-05 DIAGNOSIS — Z8674 Personal history of sudden cardiac arrest: Secondary | ICD-10-CM | POA: Insufficient documentation

## 2019-10-05 DIAGNOSIS — Z8541 Personal history of malignant neoplasm of cervix uteri: Secondary | ICD-10-CM | POA: Insufficient documentation

## 2019-10-05 DIAGNOSIS — Z79899 Other long term (current) drug therapy: Secondary | ICD-10-CM | POA: Insufficient documentation

## 2019-10-05 DIAGNOSIS — O10919 Unspecified pre-existing hypertension complicating pregnancy, unspecified trimester: Secondary | ICD-10-CM

## 2019-10-05 DIAGNOSIS — Z888 Allergy status to other drugs, medicaments and biological substances status: Secondary | ICD-10-CM | POA: Insufficient documentation

## 2019-10-05 DIAGNOSIS — Z885 Allergy status to narcotic agent status: Secondary | ICD-10-CM | POA: Insufficient documentation

## 2019-10-05 DIAGNOSIS — O368190 Decreased fetal movements, unspecified trimester, not applicable or unspecified: Secondary | ICD-10-CM

## 2019-10-05 DIAGNOSIS — Z833 Family history of diabetes mellitus: Secondary | ICD-10-CM | POA: Insufficient documentation

## 2019-10-05 LAB — CBC
HCT: 33.9 % — ABNORMAL LOW (ref 36.0–46.0)
Hemoglobin: 11.2 g/dL — ABNORMAL LOW (ref 12.0–15.0)
MCH: 28.6 pg (ref 26.0–34.0)
MCHC: 33 g/dL (ref 30.0–36.0)
MCV: 86.5 fL (ref 80.0–100.0)
Platelets: 199 10*3/uL (ref 150–400)
RBC: 3.92 MIL/uL (ref 3.87–5.11)
RDW: 12.6 % (ref 11.5–15.5)
WBC: 14.5 10*3/uL — ABNORMAL HIGH (ref 4.0–10.5)
nRBC: 0 % (ref 0.0–0.2)

## 2019-10-05 LAB — COMPREHENSIVE METABOLIC PANEL
ALT: 10 U/L (ref 0–44)
AST: 15 U/L (ref 15–41)
Albumin: 2.9 g/dL — ABNORMAL LOW (ref 3.5–5.0)
Alkaline Phosphatase: 83 U/L (ref 38–126)
Anion gap: 10 (ref 5–15)
BUN: 8 mg/dL (ref 6–20)
CO2: 21 mmol/L — ABNORMAL LOW (ref 22–32)
Calcium: 9.6 mg/dL (ref 8.9–10.3)
Chloride: 108 mmol/L (ref 98–111)
Creatinine, Ser: 0.56 mg/dL (ref 0.44–1.00)
GFR calc Af Amer: >60 mL/min (ref >60)
GFR calc non Af Amer: >60 mL/min (ref >60)
Glucose, Bld: 128 mg/dL — ABNORMAL HIGH (ref 70–99)
Potassium: 3.8 mmol/L (ref 3.5–5.1)
Sodium: 139 mmol/L (ref 135–145)
Total Bilirubin: 0.3 mg/dL (ref 0.3–1.2)
Total Protein: 6.1 g/dL — ABNORMAL LOW (ref 6.5–8.1)

## 2019-10-05 LAB — PROTEIN / CREATININE RATIO, URINE
Creatinine, Urine: 58.61 mg/dL
Total Protein, Urine: <6 mg/dL

## 2019-10-05 MED ORDER — LABETALOL HCL 100 MG PO TABS
200.0000 mg | ORAL_TABLET | Freq: Three times a day (TID) | ORAL | Status: DC
  Administered 2019-10-05: 200 mg via ORAL
  Filled 2019-10-05: qty 2

## 2019-10-05 MED ORDER — LABETALOL HCL 100 MG PO TABS
100.0000 mg | ORAL_TABLET | Freq: Once | ORAL | Status: AC
  Administered 2019-10-05: 100 mg via ORAL
  Filled 2019-10-05: qty 1

## 2019-10-05 NOTE — MAU Provider Note (Signed)
History     CSN: [id number]  Arrival date and time: 10/05/19 2115   First Provider Initiated Contact with Patient 10/05/19 2153      Chief Complaint  Patient presents with  . Decreased Fetal Movement   Regina Foley is a 33 yo G3P1101 at 33.3 EGA who is presenting for DFM. Normally she feels her baby moving all day, has not felt him move since around 2000 hours. She has done fetal kick counts, changed positions, eaten and drank. She has pushed on her belly to try to prod him into moving.  She denies contractions, vaginal bleeding or LOF.   She is having high blood pressures, she reports that she has hypertension and is on labetalol 200 mg TID, but has not taken her evening dose yet. She denies headache, vision changes, or LUQ pain.   OB History    Gravida  3   Para  2   Term  1   Preterm  1   AB      Living  2     SAB      TAB      Ectopic      Multiple  0   Live Births  2           Past Medical History:  Diagnosis Date  . Anemia   . Blood transfusion without reported diagnosis   . Cardiac arrest ([place])   . Cervical cancer ([place])   . Degenerative disc disease, thoracic   . Hypertension     Past Surgical History:  Procedure Laterality Date  . APPENDECTOMY    . CERVICAL CONE BIOPSY    . CHOLECYSTECTOMY    . LEEP    . Ovarian cyst     drained    Family History  Problem Relation Age of Onset  . Scoliosis Mother   . Heart disease Father   . Hypertension Father   . Diabetes Father   . Hyperlipidemia Father   . COPD Father   . Asthma Father   . Diabetes Brother   . Diabetes Sister   . Diabetes Maternal Grandmother   . COPD Maternal Grandfather   . Diabetes Paternal Grandmother     Social History   Tobacco Use  . Smoking status: Never Smoker  . Smokeless tobacco: Never Used  Substance Use Topics  . Alcohol use: No    Frequency: Never  . Drug use: No    Allergies:  Allergies  Allergen Reactions  . Diclegis [Doxylamine-Pyridoxine]  Anaphylaxis and Swelling  . Toradol [Ketorolac Tromethamine] Anaphylaxis    Throat and mouth swelling and itching  . Compazine [Prochlorperazine Edisylate]     Swelling of the throat. Took with metoclopramide, not sure which causes reaction.  . Nifedipine Other (See Comments)    Severe h/a  . Reglan [Metoclopramide]     Swelling of throat. Took with compazine, not sure which causes reaction.  . Codeine Nausea And Vomiting    unknown    Medications Prior to Admission  Medication Sig Dispense Refill Last Dose  . Acetaminophen (TYLENOL PO) Take by mouth as needed.     . labetalol (NORMODYNE) 200 MG tablet Take 2 tablets (400 mg total) by mouth 3 (three) times daily. (Patient taking differently: Take 200 mg by mouth 3 (three) times daily. ) 180 tablet 1   . pantoprazole (PROTONIX) 40 MG tablet Take 1 tablet (40 mg total) by mouth daily. (Patient taking differently: Take 40 mg by mouth as needed. ) 30  tablet 3   . Prenatal Vit-Fe Fumarate-FA (MULTIVITAMIN-PRENATAL) 27-0.8 MG TABS tablet Take 1 tablet by mouth.      . promethazine (PHENERGAN) 25 MG tablet Take 1 tablet (25 mg total) by mouth every 6 (six) hours as needed for nausea or vomiting. 30 tablet 1   . scopolamine (TRANSDERM-SCOP) 1 MG/3DAYS Place 1 patch (1.5 mg total) onto the skin every 3 (three) days. (Patient taking differently: Place 1 patch onto the skin as needed. ) 10 patch 12     Review of Systems  All other systems reviewed and are negative.  Physical Exam   Blood pressure (!) 146/97, pulse (!) 108, temperature 98.9 F (37.2 C), temperature source Oral, resp. rate 16, SpO2 98 %, not currently breastfeeding.  Physical Exam  Nursing note and vitals reviewed. Constitutional: She appears well-developed and well-nourished.  HENT:  Head: Normocephalic and atraumatic.  Eyes: Pupils are equal, round, and reactive to light. Conjunctivae and EOM are normal.  Neck: Normal range of motion. Neck supple.  Cardiovascular: Normal  rate, regular rhythm, normal heart sounds and intact distal pulses.  GI: Soft. She exhibits no distension and no mass. There is no abdominal tenderness. There is no rebound and no guarding.  gravid  Skin: Skin is dry.  Psychiatric: She has a normal mood and affect. Her behavior is normal. Judgment and thought content normal.    MAU Course  Procedures  MDM -NST reactive -BPP ordered -home labetalol ordered -Pre-E labs ordered  Results for orders placed or performed during the hospital encounter of 10/05/19 (from the past 24 hour(s))  Comprehensive metabolic panel     Status: Abnormal   Collection Time: 10/05/19 10:12 PM  Result Value Ref Range   Sodium 139 135 - 145 mmol/L   Potassium 3.8 3.5 - 5.1 mmol/L   Chloride 108 98 - 111 mmol/L   CO2 21 (L) 22 - 32 mmol/L   Glucose, Bld 128 (H) 70 - 99 mg/dL   BUN 8 6 - 20 mg/dL   Creatinine, Ser 0.56 0.44 - 1.00 mg/dL   Calcium 9.6 8.9 - 10.3 mg/dL   Total Protein 6.1 (L) 6.5 - 8.1 g/dL   Albumin 2.9 (L) 3.5 - 5.0 g/dL   AST 15 15 - 41 U/L   ALT 10 0 - 44 U/L   Alkaline Phosphatase 83 38 - 126 U/L   Total Bilirubin 0.3 0.3 - 1.2 mg/dL   GFR calc non Af Amer >60 >60 mL/min   GFR calc Af Amer >60 >60 mL/min   Anion gap 10 5 - 15  CBC     Status: Abnormal   Collection Time: 10/05/19 10:12 PM  Result Value Ref Range   WBC 14.5 (H) 4.0 - 10.5 K/uL   RBC 3.92 3.87 - 5.11 MIL/uL   Hemoglobin 11.2 (L) 12.0 - 15.0 g/dL   HCT 33.9 (L) 36.0 - 46.0 %   MCV 86.5 80.0 - 100.0 fL   MCH 28.6 26.0 - 34.0 pg   MCHC 33.0 30.0 - 36.0 g/dL   RDW 12.6 11.5 - 15.5 %   Platelets 199 150 - 400 K/uL   nRBC 0.0 0.0 - 0.2 %  Protein / creatinine ratio, urine     Status: None   Collection Time: 10/05/19 10:34 PM  Result Value Ref Range   Creatinine, Urine 58.61 mg/dL   Total Protein, Urine <6 mg/dL   Protein Creatinine Ratio        0.00 - 0.15 mg/mg[Cre]  BPP: 6/8, points off for breathing. Amniotic fluid within normal limits: Largest pocket 7.1  cm, AFI 20.9 cm. Cephalic presentation.  Reassessment: -Feeling some flutterings now -Reassured with reactive NST and [place]  Assessment and Plan  33 yo G3P1102 at 33.4 EGA with DFM -NST reactive -BPP shows active fetus with normal fluid, BPP 6/8 (points taken off for breathing movements) -Patient reassured -Keep NST appointment at [facility] 10/06/19 -cHTN, on labetalol 200 mg TID. Some BPs in severe range. Dose increased to 300 mg TID, new script sent  Merilyn Baba, DO OB Fellow, [facility] 10/06/2019 1:23 AM

## 2019-10-05 NOTE — MAU Note (Signed)
Presents with no FM since approx 2000. Pt drank [facility], changed positions, pushed on belly with no response. No vag bldg or LOF.   Gilmer Mor RN

## 2019-10-06 ENCOUNTER — Other Ambulatory Visit: Payer: Self-pay

## 2019-10-06 ENCOUNTER — Other Ambulatory Visit: Payer: 59

## 2019-10-06 ENCOUNTER — Ambulatory Visit (INDEPENDENT_AMBULATORY_CARE_PROVIDER_SITE_OTHER): Payer: 59 | Admitting: *Deleted

## 2019-10-06 VITALS — BP 136/95 | HR 114 | Wt 223.0 lb

## 2019-10-06 DIAGNOSIS — Z1389 Encounter for screening for other disorder: Secondary | ICD-10-CM

## 2019-10-06 DIAGNOSIS — Z3A33 33 weeks gestation of pregnancy: Secondary | ICD-10-CM

## 2019-10-06 DIAGNOSIS — Z331 Pregnant state, incidental: Secondary | ICD-10-CM

## 2019-10-06 DIAGNOSIS — O0993 Supervision of high risk pregnancy, unspecified, third trimester: Secondary | ICD-10-CM

## 2019-10-06 DIAGNOSIS — O0990 Supervision of high risk pregnancy, unspecified, unspecified trimester: Secondary | ICD-10-CM

## 2019-10-06 DIAGNOSIS — O10913 Unspecified pre-existing hypertension complicating pregnancy, third trimester: Secondary | ICD-10-CM

## 2019-10-06 DIAGNOSIS — O10919 Unspecified pre-existing hypertension complicating pregnancy, unspecified trimester: Secondary | ICD-10-CM

## 2019-10-06 LAB — POCT URINALYSIS DIPSTICK OB
Blood, UA: NEGATIVE
Glucose, UA: NEGATIVE
Ketones, UA: NEGATIVE
Leukocytes, UA: NEGATIVE
Nitrite, UA: NEGATIVE

## 2019-10-06 MED ORDER — LABETALOL HCL 100 MG PO TABS: 300.0000 mg | ORAL_TABLET | Freq: Three times a day (TID) | ORAL | 0 refills | Status: DC

## 2019-10-06 NOTE — Progress Notes (Signed)
   NURSE VISIT- NST  SUBJECTIVE:  Regina Foley is a 33 y.o. [id number] female at [redacted]w[redacted]d, here for a NST for pregnancy complicated by [facility].  She reports active fetal movement, contractions: none, vaginal bleeding: none, membranes: intact.   OBJECTIVE:  BP (!) 136/95   Pulse (!) 114   Wt 223 lb (101.2 kg)   LMP  (LMP Unknown)   BMI 35.99 kg/m   Appears well, no apparent distress  Results for orders placed or performed in visit on 10/06/19 (from the past 24 hour(s))  POC Urinalysis Dipstick OB   Collection Time: 10/06/19  1:49 PM  Result Value Ref Range   Color, UA     Clarity, UA     Glucose, UA Negative Negative   Bilirubin, UA     Ketones, UA neg    Spec Grav, UA     Blood, UA neg    pH, UA     POC,PROTEIN,UA Trace Negative, Trace, Small (1+), Moderate (2+), Large (3+), 4+   Urobilinogen, UA     Nitrite, UA neg    Leukocytes, UA Negative Negative   Appearance     Odor    Results for orders placed or performed during the hospital encounter of 10/05/19 (from the past 24 hour(s))  Comprehensive metabolic panel   Collection Time: 10/05/19 10:12 PM  Result Value Ref Range   Sodium 139 135 - 145 mmol/L   Potassium 3.8 3.5 - 5.1 mmol/L   Chloride 108 98 - 111 mmol/L   CO2 21 (L) 22 - 32 mmol/L   Glucose, Bld 128 (H) 70 - 99 mg/dL   BUN 8 6 - 20 mg/dL   Creatinine, Ser 0.56 0.44 - 1.00 mg/dL   Calcium 9.6 8.9 - 10.3 mg/dL   Total Protein 6.1 (L) 6.5 - 8.1 g/dL   Albumin 2.9 (L) 3.5 - 5.0 g/dL   AST 15 15 - 41 U/L   ALT 10 0 - 44 U/L   Alkaline Phosphatase 83 38 - 126 U/L   Total Bilirubin 0.3 0.3 - 1.2 mg/dL   GFR calc non Af Amer >60 >60 mL/min   GFR calc Af Amer >60 >60 mL/min   Anion gap 10 5 - 15  CBC   Collection Time: 10/05/19 10:12 PM  Result Value Ref Range   WBC 14.5 (H) 4.0 - 10.5 K/uL   RBC 3.92 3.87 - 5.11 MIL/uL   Hemoglobin 11.2 (L) 12.0 - 15.0 g/dL   HCT 33.9 (L) 36.0 - 46.0 %   MCV 86.5 80.0 - 100.0 fL   MCH 28.6 26.0 - 34.0 pg   MCHC 33.0 30.0 - 36.0  g/dL   RDW 12.6 11.5 - 15.5 %   Platelets 199 150 - 400 K/uL   nRBC 0.0 0.0 - 0.2 %  Protein / creatinine ratio, urine   Collection Time: 10/05/19 10:34 PM  Result Value Ref Range   Creatinine, Urine 58.61 mg/dL   Total Protein, Urine <6 mg/dL   Protein Creatinine Ratio        0.00 - 0.15 mg/mg[Cre]    NST: FHR baseline 140 bpm, Variability: moderate, Accelerations:present, Decelerations:  Absent= Cat 1 /Reactive Toco: none   ASSESSMENT: [id number] at [redacted]w[redacted]d with CHTN NST reactive  PLAN: EFM strip reviewed by Dr. Elonda Husky   Recommendations: keep next appointment as scheduled    Alice Rieger  10/06/2019 2:59 PM

## 2019-10-06 NOTE — Discharge Instructions (Signed)
Hypertension During Pregnancy °Hypertension is also called high blood pressure. High blood pressure means that the force of your blood moving in your body is too strong. It can cause problems for you and your baby. Different types of high blood pressure can happen during pregnancy. The types are: °· High blood pressure before you got pregnant. This is called chronic hypertension.  This can continue during your pregnancy. Your doctor will want to keep checking your blood pressure. You may need medicine to keep your blood pressure under control while you are pregnant. You will need follow-up visits after you have your baby. °· High blood pressure that goes up during pregnancy when it was normal before. This is called gestational hypertension. It will usually get better after you have your baby, but your doctor will need to watch your blood pressure to make sure that it is getting better. °· Very high blood pressure during pregnancy. This is called preeclampsia. Very high blood pressure is an emergency that needs to be checked and treated right away. °· You may develop very high blood pressure after giving birth. This is called postpartum preeclampsia. This usually occurs within 48 hours after childbirth but may occur up to 6 weeks after giving birth. This is rare. °How does this affect me? °If you have high blood pressure during pregnancy, you have a higher chance of developing high blood pressure: °· As you get older. °· If you get pregnant again. °In some cases, high blood pressure during pregnancy can cause: °· Stroke. °· Heart attack. °· Damage to the kidneys, lungs, or liver. °· Preeclampsia. °· Jerky movements you cannot control (convulsions or seizures). °· Problems with the placenta. °How does this affect my baby? °Your baby may: °· Be born early. °· Not weigh as much as he or she should. °· Not handle labor well, leading to a c-section birth. °What are the risks? °· Having high blood pressure during a past  pregnancy. °· Being overweight. °· Being 35 years old or older. °· Being pregnant for the first time. °· Being pregnant with more than one baby. °· Becoming pregnant using fertility methods, such as IVF. °· Having other problems, such as diabetes, or kidney disease. °· Having family members who have high blood pressure. °What can I do to lower my risk? ° °· Keep a healthy weight. °· Eat a healthy diet. °· Follow what your doctor tells you about treating any medical problems that you had before becoming pregnant. °It is very important to go to all of your doctor visits. Your doctor will check your blood pressure and make sure that your pregnancy is progressing as it should. Treatment should start early if a problem is found. °How is this treated? °Treatment for high blood pressure during pregnancy can differ depending on the type of high blood pressure you have and how serious it is. °· You may need to take blood pressure medicine. °· If you have been taking medicine for your blood pressure, you may need to change the medicine during pregnancy if it is not safe for your baby. °· If your doctor thinks that you could get very high blood pressure, he or she may tell you to take a low-dose aspirin during your pregnancy. °· If you have very high blood pressure, you may need to stay in the hospital so you and your baby can be watched closely. You may also need to take medicine to lower your blood pressure. This medicine may be given by mouth   or through an IV tube.  In some cases, if your condition gets worse, you may need to have your baby early. Follow these instructions at home: Eating and drinking   Drink enough fluid to keep your pee (urine) pale yellow.  Avoid caffeine. Lifestyle  Do not use any products that contain nicotine or tobacco, such as cigarettes, e-cigarettes, and chewing tobacco. If you need help quitting, ask your doctor.  Do not use alcohol or drugs.  Avoid stress.  Rest and get plenty  of sleep.  Regular exercise can help. Ask your doctor what kinds of exercise are best for you. General instructions  Take over-the-counter and prescription medicines only as told by your doctor.  Keep all prenatal and follow-up visits as told by your doctor. This is important. Contact a doctor if:  You have symptoms that your doctor told you to watch for, such as: ? Headaches. ? Nausea. ? Vomiting. ? Belly (abdominal) pain. ? Dizziness. ? Light-headedness. Get help right away if:  You have: ? Very bad belly pain that does not get better with treatment. ? A very bad headache that does not get better. ? Vomiting that does not get better. ? Sudden, fast weight gain. ? Sudden swelling in your hands, ankles, or face. ? Bleeding from your vagina. ? Blood in your pee. ? Blurry vision. ? Double vision. ? Shortness of breath. ? Chest pain. ? Weakness on one side of your body. ? Trouble talking.  Your baby is not moving as much as usual. Summary  High blood pressure is also called hypertension.  High blood pressure means that the force of your blood moving in your body is too strong.  High blood pressure can cause problems for you and your baby.  Keep all follow-up visits as told by your doctor. This is important. This information is not intended to replace advice given to you by your health care provider. Make sure you discuss any questions you have with your health care provider. Document Released: 12/30/2010 Document Revised: 03/20/2019 Document Reviewed: 12/24/2018 Elsevier Patient Education  2020 [place].  Fetal Movement Counts Patient Name: ________________________________________________ Patient Due Date: ____________________ What is a fetal movement count?  A fetal movement count is the number of times that you feel your baby move during a certain amount of time. This may also be called a fetal kick count. A fetal movement count is recommended for every  pregnant woman. You may be asked to start counting fetal movements as early as week 28 of your pregnancy. Pay attention to when your baby is most active. You may notice your baby's sleep and wake cycles. You may also notice things that make your baby move more. You should do a fetal movement count:  When your baby is normally most active.  At the same time each day. A good time to count movements is while you are resting, after having something to eat and drink. How do I count fetal movements? 1. Find a quiet, comfortable area. Sit, or lie down on your side. 2. Write down the date, the start time and stop time, and the number of movements that you felt between those two times. Take this information with you to your health care visits. 3. For 2 hours, count kicks, flutters, swishes, rolls, and jabs. You should feel at least 10 movements during 2 hours. 4. You may stop counting after you have felt 10 movements. 5. If you do not feel 10 movements in 2 hours, have  something to eat and drink. Then, keep resting and counting for 1 hour. If you feel at least 4 movements during that hour, you may stop counting. Contact a health care provider if:  You feel fewer than 4 movements in 2 hours.  Your baby is not moving like he or she usually does. Date: ____________ Start time: ____________ Stop time: ____________ Movements: ____________ Date: ____________ Start time: ____________ Stop time: ____________ Movements: ____________ Date: ____________ Start time: ____________ Stop time: ____________ Movements: ____________ Date: ____________ Start time: ____________ Stop time: ____________ Movements: ____________ Date: ____________ Start time: ____________ Stop time: ____________ Movements: ____________ Date: ____________ Start time: ____________ Stop time: ____________ Movements: ____________ Date: ____________ Start time: ____________ Stop time: ____________ Movements: ____________ Date: ____________ Start  time: ____________ Stop time: ____________ Movements: ____________ Date: ____________ Start time: ____________ Stop time: ____________ Movements: ____________ This information is not intended to replace advice given to you by your health care provider. Make sure you discuss any questions you have with your health care provider. Document Released: 12/27/2006 Document Revised: 12/17/2018 Document Reviewed: 01/06/2016 Elsevier Patient Education  2020 [employer].

## 2019-10-07 ENCOUNTER — Encounter (HOSPITAL_COMMUNITY): Payer: Self-pay

## 2019-10-07 ENCOUNTER — Inpatient Hospital Stay (HOSPITAL_COMMUNITY): Payer: 59 | Admitting: Anesthesiology

## 2019-10-07 ENCOUNTER — Other Ambulatory Visit: Payer: Self-pay

## 2019-10-07 ENCOUNTER — Inpatient Hospital Stay (HOSPITAL_COMMUNITY)
Admission: AD | Admit: 2019-10-07 | Discharge: 2019-10-11 | DRG: 998 | Disposition: A | Discharge status: Home / Self Care | Payer: 59 | Attending: Family Medicine | Admitting: Family Medicine

## 2019-10-07 DIAGNOSIS — Z8759 Personal history of other complications of pregnancy, childbirth and the puerperium: Secondary | ICD-10-CM

## 2019-10-07 DIAGNOSIS — O9081 Anemia of the puerperium: Secondary | ICD-10-CM | POA: Diagnosis not present

## 2019-10-07 DIAGNOSIS — O0990 Supervision of high risk pregnancy, unspecified, unspecified trimester: Secondary | ICD-10-CM

## 2019-10-07 DIAGNOSIS — Z9079 Acquired absence of other genital organ(s): Secondary | ICD-10-CM

## 2019-10-07 DIAGNOSIS — O1002 Pre-existing essential hypertension complicating childbirth: Secondary | ICD-10-CM | POA: Diagnosis present

## 2019-10-07 DIAGNOSIS — Z20828 Contact with and (suspected) exposure to other viral communicable diseases: Secondary | ICD-10-CM | POA: Diagnosis present

## 2019-10-07 DIAGNOSIS — Z302 Encounter for sterilization: Secondary | ICD-10-CM

## 2019-10-07 DIAGNOSIS — I10 Essential (primary) hypertension: Secondary | ICD-10-CM | POA: Diagnosis present

## 2019-10-07 DIAGNOSIS — Z3A33 33 weeks gestation of pregnancy: Secondary | ICD-10-CM | POA: Diagnosis not present

## 2019-10-07 DIAGNOSIS — O1414 Severe pre-eclampsia complicating childbirth: Secondary | ICD-10-CM

## 2019-10-07 DIAGNOSIS — O114 Pre-existing hypertension with pre-eclampsia, complicating childbirth: Principal | ICD-10-CM | POA: Diagnosis present

## 2019-10-07 DIAGNOSIS — Z23 Encounter for immunization: Secondary | ICD-10-CM

## 2019-10-07 DIAGNOSIS — O10919 Unspecified pre-existing hypertension complicating pregnancy, unspecified trimester: Secondary | ICD-10-CM | POA: Diagnosis present

## 2019-10-07 DIAGNOSIS — O09299 Supervision of pregnancy with other poor reproductive or obstetric history, unspecified trimester: Secondary | ICD-10-CM

## 2019-10-07 HISTORY — DX: Gestational (pregnancy-induced) hypertension without significant proteinuria, unspecified trimester: O13.9

## 2019-10-07 HISTORY — DX: Unspecified thoracic, thoracolumbar and lumbosacral intervertebral disc disorder: M51.9

## 2019-10-07 LAB — COMPREHENSIVE METABOLIC PANEL
ALT: 11 U/L (ref 0–44)
AST: 15 U/L (ref 15–41)
Albumin: 2.9 g/dL — ABNORMAL LOW (ref 3.5–5.0)
Alkaline Phosphatase: 113 U/L (ref 38–126)
Anion gap: 12 (ref 5–15)
BUN: 9 mg/dL (ref 6–20)
CO2: 18 mmol/L — ABNORMAL LOW (ref 22–32)
Calcium: 8.9 mg/dL (ref 8.9–10.3)
Chloride: 103 mmol/L (ref 98–111)
Creatinine, Ser: 0.53 mg/dL (ref 0.44–1.00)
GFR calc Af Amer: >60 mL/min (ref >60)
GFR calc non Af Amer: >60 mL/min (ref >60)
Glucose, Bld: 91 mg/dL (ref 70–99)
Potassium: 3.5 mmol/L (ref 3.5–5.1)
Sodium: 133 mmol/L — ABNORMAL LOW (ref 135–145)
Total Bilirubin: 0.7 mg/dL (ref 0.3–1.2)
Total Protein: 6.8 g/dL (ref 6.5–8.1)

## 2019-10-07 LAB — URINALYSIS, ROUTINE W REFLEX MICROSCOPIC
Bilirubin Urine: NEGATIVE
Glucose, UA: NEGATIVE mg/dL
Ketones, ur: 20 mg/dL — AB
Nitrite: NEGATIVE
Protein, ur: NEGATIVE mg/dL
Specific Gravity, Urine: 1.012 (ref 1.005–1.030)
pH: 6 (ref 5.0–8.0)

## 2019-10-07 LAB — TYPE AND SCREEN
ABO/RH(D): A POS
Antibody Screen: NEGATIVE

## 2019-10-07 LAB — CBC
HCT: 34.7 % — ABNORMAL LOW (ref 36.0–46.0)
Hemoglobin: 11.4 g/dL — ABNORMAL LOW (ref 12.0–15.0)
MCH: 28.7 pg (ref 26.0–34.0)
MCHC: 32.9 g/dL (ref 30.0–36.0)
MCV: 87.4 fL (ref 80.0–100.0)
Platelets: 222 10*3/uL (ref 150–400)
RBC: 3.97 MIL/uL (ref 3.87–5.11)
RDW: 12.8 % (ref 11.5–15.5)
WBC: 15.6 10*3/uL — ABNORMAL HIGH (ref 4.0–10.5)
nRBC: 0 % (ref 0.0–0.2)

## 2019-10-07 LAB — ABO/RH: ABO/RH(D): A POS

## 2019-10-07 LAB — SARS CORONAVIRUS 2 BY RT PCR (HOSPITAL ORDER, PERFORMED IN ~~LOC~~ HOSPITAL LAB): SARS Coronavirus 2: NEGATIVE

## 2019-10-07 MED ORDER — ACETAMINOPHEN 325 MG PO TABS
650.0000 mg | ORAL_TABLET | ORAL | Status: DC | PRN
  Administered 2019-10-08 (×3): 650 mg via ORAL
  Filled 2019-10-07 (×3): qty 2

## 2019-10-07 MED ORDER — FENTANYL CITRATE (PF) 100 MCG/2ML IJ SOLN
100.0000 ug | INTRAMUSCULAR | Status: DC | PRN
  Administered 2019-10-07 (×3): 100 ug via INTRAVENOUS
  Filled 2019-10-07 (×2): qty 2

## 2019-10-07 MED ORDER — ONDANSETRON HCL 4 MG/2ML IJ SOLN
4.0000 mg | Freq: Four times a day (QID) | INTRAMUSCULAR | Status: DC | PRN
  Administered 2019-10-07 – 2019-10-08 (×4): 4 mg via INTRAVENOUS
  Filled 2019-10-07 (×4): qty 2

## 2019-10-07 MED ORDER — FENTANYL-BUPIVACAINE-NACL 0.5-0.125-0.9 MG/250ML-% EP SOLN
12.0000 mL/h | EPIDURAL | Status: DC | PRN
  Administered 2019-10-08: 16:00:00 12 mL/h via EPIDURAL
  Filled 2019-10-07: qty 250

## 2019-10-07 MED ORDER — LABETALOL HCL 5 MG/ML IV SOLN
20.0000 mg | INTRAVENOUS | Status: DC | PRN
  Administered 2019-10-07: 21:00:00 20 mg via INTRAVENOUS

## 2019-10-07 MED ORDER — LABETALOL HCL 5 MG/ML IV SOLN
INTRAVENOUS | Status: AC
  Administered 2019-10-07: 21:00:00 20 mg via INTRAVENOUS
  Filled 2019-10-07: qty 4

## 2019-10-07 MED ORDER — OXYTOCIN BOLUS FROM INFUSION
500.0000 mL | Freq: Once | INTRAVENOUS | Status: AC
  Administered 2019-10-08: 500 mL via INTRAVENOUS

## 2019-10-07 MED ORDER — LABETALOL HCL 5 MG/ML IV SOLN: 80.0000 mg | INTRAVENOUS | Status: DC | PRN

## 2019-10-07 MED ORDER — SODIUM CHLORIDE 0.9 % IV SOLN
5.0000 10*6.[IU] | Freq: Once | INTRAVENOUS | Status: AC
  Administered 2019-10-08: 5 10*6.[IU] via INTRAVENOUS
  Filled 2019-10-07: qty 5

## 2019-10-07 MED ORDER — LACTATED RINGERS IV SOLN
INTRAVENOUS | Status: DC
  Administered 2019-10-07: 21:00:00 via INTRAVENOUS

## 2019-10-07 MED ORDER — PHENYLEPHRINE 40 MCG/ML (10ML) SYRINGE FOR IV PUSH (FOR BLOOD PRESSURE SUPPORT): 80.0000 ug | PREFILLED_SYRINGE | INTRAVENOUS | Status: DC | PRN

## 2019-10-07 MED ORDER — MAGNESIUM SULFATE BOLUS VIA INFUSION
4.0000 g | Freq: Once | INTRAVENOUS | Status: AC
  Administered 2019-10-07: 4 g via INTRAVENOUS
  Filled 2019-10-07: qty 1000

## 2019-10-07 MED ORDER — LACTATED RINGERS IV SOLN: 500.0000 mL | INTRAVENOUS | Status: DC | PRN

## 2019-10-07 MED ORDER — SOD CITRATE-CITRIC ACID 500-334 MG/5ML PO SOLN
30.0000 mL | ORAL | Status: DC | PRN
  Administered 2019-10-08: 20:00:00 30 mL via ORAL
  Filled 2019-10-07: qty 30

## 2019-10-07 MED ORDER — EPHEDRINE 5 MG/ML INJ: 10.0000 mg | INTRAVENOUS | Status: DC | PRN

## 2019-10-07 MED ORDER — FENTANYL-BUPIVACAINE-NACL 0.5-0.125-0.9 MG/250ML-% EP SOLN
EPIDURAL | Status: AC
  Filled 2019-10-07: qty 250

## 2019-10-07 MED ORDER — PENICILLIN G 3 MILLION UNITS IVPB - SIMPLE MED
3.0000 10*6.[IU] | INTRAVENOUS | Status: DC
  Administered 2019-10-08 (×4): 3 10*6.[IU] via INTRAVENOUS
  Filled 2019-10-07 (×7): qty 100

## 2019-10-07 MED ORDER — OXYTOCIN 40 UNITS IN NORMAL SALINE INFUSION - SIMPLE MED
2.5000 [IU]/h | INTRAVENOUS | Status: DC
  Filled 2019-10-07: qty 1000

## 2019-10-07 MED ORDER — LACTATED RINGERS IV SOLN
500.0000 mL | Freq: Once | INTRAVENOUS | Status: AC
  Administered 2019-10-07: 500 mL via INTRAVENOUS

## 2019-10-07 MED ORDER — MAGNESIUM SULFATE 40 GM/1000ML IV SOLN
2.0000 g/h | INTRAVENOUS | Status: DC
  Administered 2019-10-07 – 2019-10-08 (×2): 2 g/h via INTRAVENOUS
  Filled 2019-10-07 (×3): qty 1000

## 2019-10-07 MED ORDER — LACTATED RINGERS IV SOLN
INTRAVENOUS | Status: DC
  Administered 2019-10-08 (×2): via INTRAVENOUS

## 2019-10-07 MED ORDER — FENTANYL CITRATE (PF) 100 MCG/2ML IJ SOLN
INTRAMUSCULAR | Status: AC
  Filled 2019-10-07: qty 2

## 2019-10-07 MED ORDER — LABETALOL HCL 5 MG/ML IV SOLN: 40.0000 mg | INTRAVENOUS | Status: DC | PRN

## 2019-10-07 MED ORDER — LIDOCAINE HCL (PF) 1 % IJ SOLN: 30.0000 mL | INTRAMUSCULAR | Status: DC | PRN

## 2019-10-07 MED ORDER — HYDRALAZINE HCL 20 MG/ML IJ SOLN: 10.0000 mg | INTRAMUSCULAR | Status: DC | PRN

## 2019-10-07 MED ORDER — BETAMETHASONE SOD PHOS & ACET 6 (3-3) MG/ML IJ SUSP
12.0000 mg | INTRAMUSCULAR | Status: DC
  Administered 2019-10-07: 12 mg via INTRAMUSCULAR
  Filled 2019-10-07 (×3): qty 5

## 2019-10-07 MED ORDER — PHENYLEPHRINE 40 MCG/ML (10ML) SYRINGE FOR IV PUSH (FOR BLOOD PRESSURE SUPPORT)
PREFILLED_SYRINGE | INTRAVENOUS | Status: AC
  Filled 2019-10-07: qty 10

## 2019-10-07 MED ORDER — DIPHENHYDRAMINE HCL 50 MG/ML IJ SOLN: 12.5000 mg | INTRAMUSCULAR | Status: DC | PRN

## 2019-10-07 NOTE — MAU Note (Signed)
Pt reports to MAU c/o ctx and vaginal pressure. Pt states the ctx are about every 2+ min. Pt don't think she is leaking fluid. ? Bloody show. Pt is also reporting a DFM. At 1300 her BP was 153/91 and she took her BP med and it was 145/86.     Ctx on the way here:  [address] [place]

## 2019-10-07 NOTE — Progress Notes (Signed)
Regina Foley is a 33 y.o. [id number] at [redacted]w[redacted]d admitted for pre-term labor  Subjective: Feeling more pain with contractions, would like epidural  Objective: BP (!) 143/84   Pulse (!) 108   Temp 99 F (37.2 C) (Oral)   Resp 18   Wt 101.5 kg   LMP  (LMP Unknown)   SpO2 97%   BMI 36.11 kg/m  Total I/O In: 692.7 [P.O.:250; I.V.:442.7] Out: 200 [Urine:200]  FHT:  FHR: 135 bpm, variability: moderate,  accelerations:  Present,  decelerations:  Absent UC:   regular, every 4-5 minutes  SVE:   Dilation: 8 Effacement (%): 70 Station: -1 Exam by:: Dr. Darene Lamer   Labs: Lab Results  Component Value Date   WBC 15.6 (H) 10/07/2019   HGB 11.4 (L) 10/07/2019   HCT 34.7 (L) 10/07/2019   MCV 87.4 10/07/2019   PLT 222 10/07/2019    Assessment / Plan: 33 yo G3P1102 at 33.5 EGA in pre-term labor  Labor: Progressing despite magnesium. S/p BMZ x1. Continue to monitor Fetal Wellbeing:  Category I Pain Control:  Epidural I/D:  GBS unknown, preterm labor progressing, PCN  Anticipated MOD:  NSVD  Karra Pink L [place] Kuang DO OB Fellow, [facility] 10/07/2019, 11:44 PM

## 2019-10-07 NOTE — H&P (Signed)
HPI: Regina Foley is a 33 y.o. year old G17P1102 female at [redacted]w[redacted]d weeks gestation who presents to MAU reporting preterm Labor. Contractions started at 1000 this morning. Denies ROM. Scant bloody show.     FAMILY TREE  LAB RESULTS  Language English Pap 09/14/17 -  Initiated care at [facility] GC/CT Initial:  -/-          36wks:  Dating by 9wk U/S    Support person Caroline Sauger Genetics NT/IT: neg    AFP:      MaterniT21:    [place]/HgbE neg  Flu vaccine  CF   TDaP vaccine  SMA   Rhogam NA Fragile X        Anatomy US normal Blood Type A/Positive/-- (06/09 1609)  Feeding Plan breast Antibody Negative (06/09 1609)  Contraception Salpingectomy vs. hysterectomy HBsAg Negative (06/09 1609)  Circumcision  RPR Non Reactive (06/09 1609)  Pediatrician [place] Rubella  24.50 (06/09 1609)  Prenatal Classes declined HIV Non Reactive (06/09 1609)    GTT/A1C Early:      26-28wks: 85/159/112  BTL Consent  GBS        [ ]  PCN allergy  VBAC Consent     Waterbirth [ ] Class [ ] Consent [ ] CNM visit PP Needs         OB History    Gravida  3   Para  2   Term  1   Preterm  1   AB      Living  2     SAB      TAB      Ectopic      Multiple  0   Live Births  2          Past Medical History:  Diagnosis Date  . Anemia   . Blood transfusion without reported diagnosis   . Cardiac arrest (HCC)--Per records pt had arrhythmia during ablation for SVT--HR 250's.    . Cervical cancer ([place])   . Degenerative disc disease, thoracic   . Chronic Hypertension   . Intervertebral disk disease   . Pregnancy induced hypertension    Past Surgical History:  Procedure Laterality Date  . APPENDECTOMY    . CERVICAL CONE BIOPSY    . CHOLECYSTECTOMY    . LEEP    . Ovarian cyst     drained   Family History: family history includes Asthma in her father; COPD in her father and maternal grandfather; Diabetes in her brother, father, maternal grandmother, paternal grandmother, and sister; Heart disease in her  father; Hyperlipidemia in her father; Hypertension in her father; Kidney disease in her father; Scoliosis in her mother. Social History:  reports that she has never smoked. She has never used smokeless tobacco. She reports that she does not drink alcohol or use drugs.     Maternal Diabetes: No Genetic Screening: Normal Maternal Ultrasounds/Referrals: Normal Fetal Ultrasounds or other Referrals:  None Maternal Substance Abuse:  No Significant Maternal Medications:  Meds include: Other:  Significant Maternal Lab Results:  Other:  Other Comments:  preterm labor, CHTN Labetalol, GBS unknown  Review of Systems  Constitutional: Negative for chills and fever.  Eyes: Negative for blurred vision.  Respiratory: Negative for shortness of breath.   Cardiovascular: Positive for leg swelling. Negative for chest pain.  Gastrointestinal: Positive for abdominal pain (contractions only ).  Genitourinary:       Neg for LOF. Scant bloody show  Neurological: Negative for headaches.   Maternal Medical History:  Reason for  admission: Contractions.   Contractions: Onset was 6-12 hours ago.   Frequency: regular.   Perceived severity is strong.    Fetal activity: Perceived fetal activity is normal.    Prenatal complications: PIH.   No bleeding.   Prenatal Complications - Diabetes: none.    Dilation: 6 Effacement (%): 70 Station: Ballotable Exam by:: [place] Jedi Catalfamo CNM Blood pressure (!) 151/102, pulse (!) 128, temperature 98.4 F (36.9 C), temperature source Oral, resp. rate 19, weight 101.5 kg, SpO2 100 %, not currently breastfeeding.  Patient Vitals for the past 24 hrs:  BP Temp Temp src Pulse Resp SpO2 Weight  10/07/19 2103 (!) 144/94 - - (!) 114 - - -  10/07/19 2054 (!) 159/93 - - (!) 114 - - -  10/07/19 2035 (!) 161/101 - - (!) 120 - 100 % -  10/07/19 2019 (!) 171/105 - - (!) 124 - - -  10/07/19 1958 (!) 151/102 - - (!) 128 - - -  10/07/19 1944 (!) 150/95 98.4 F (36.9 C) Oral (!) 130  19 100 % 101.5 kg     Maternal Exam:  Uterine Assessment: Contraction strength is firm.  Contraction frequency is regular.   Abdomen: Patient reports no abdominal tenderness. Estimated fetal weight is 5 lb.   Fetal presentation: vertex  Introitus: Normal vulva. Normal vagina.  Vagina is negative for discharge.  Pelvis: adequate for delivery.   Cervix: Cervix evaluated by digital exam.     Fetal Exam Fetal Monitor Review: Mode: ultrasound.   Baseline rate: 140.  Variability: moderate (6-25 bpm).   Pattern: accelerations present and no decelerations.    Fetal State Assessment: Category I - tracings are normal.     Physical Exam  Constitutional: She is oriented to person, place, and time. She appears well-developed and well-nourished. She appears distressed.  Eyes: No scleral icterus.  Cardiovascular: Tachycardia present.  Respiratory: Effort normal. No respiratory distress.  GI: Soft. There is no abdominal tenderness.  Genitourinary:    Vulva and vagina normal.     No vaginal discharge.   Musculoskeletal:        General: Edema present.  Neurological: She is alert and oriented to person, place, and time. She has normal reflexes.  Skin: Skin is warm and dry.  Psychiatric: She has a normal mood and affect.    Prenatal labs: ABO, Rh: A/Positive/-- (06/09 1609) Antibody: Negative (09/04 0916) Rubella: 24.50 (06/09 1609) RPR: Non Reactive (09/04 0916)  HBsAg: Negative (06/09 1609)  HIV: Non Reactive (09/04 0916)  GBS:   Unknown  Assessment: 1. Labor: Active preterm labor 2. Fetal Wellbeing: Category I  3. Pain Control: None 4. GBS: Unknown 5. 33.5 week IUP 6. CHTN w/ superimposed preeclampsia 7.  History of severe postpartum hemorrhage x2 8.  Undesired fertility  Plan:  1. Admit to BS per consult with MD 2. Routine L&D orders 3. Analgesia/anesthesia PRN  4. BMZ x 2 5. Magnesium sulfate for preeclampsia.  6. NICU notified. 7. TXA in labor.  8.  Start  preeclampsia protocol 9.  Plan salpingectomy if covered by insurance inpatient  [place] 10/07/2019, 8:28 PM

## 2019-10-08 ENCOUNTER — Encounter (HOSPITAL_COMMUNITY): Payer: Self-pay | Admitting: *Deleted

## 2019-10-08 DIAGNOSIS — O1414 Severe pre-eclampsia complicating childbirth: Secondary | ICD-10-CM | POA: Diagnosis present

## 2019-10-08 DIAGNOSIS — O1002 Pre-existing essential hypertension complicating childbirth: Secondary | ICD-10-CM

## 2019-10-08 DIAGNOSIS — Z3A33 33 weeks gestation of pregnancy: Secondary | ICD-10-CM

## 2019-10-08 DIAGNOSIS — O114 Pre-existing hypertension with pre-eclampsia, complicating childbirth: Secondary | ICD-10-CM

## 2019-10-08 LAB — MAGNESIUM: Magnesium: 5.5 mg/dL — ABNORMAL HIGH (ref 1.7–2.4)

## 2019-10-08 LAB — RPR: RPR Ser Ql: NONREACTIVE

## 2019-10-08 MED ORDER — FENTANYL-BUPIVACAINE-NACL 0.5-0.125-0.9 MG/250ML-% EP SOLN: 12.0000 mL/h | EPIDURAL | Status: DC | PRN

## 2019-10-08 MED ORDER — SIMETHICONE 80 MG PO CHEW
80.0000 mg | CHEWABLE_TABLET | ORAL | Status: DC | PRN
  Administered 2019-10-09 – 2019-10-10 (×4): 80 mg via ORAL
  Filled 2019-10-08 (×4): qty 1

## 2019-10-08 MED ORDER — TRANEXAMIC ACID-NACL 1000-0.7 MG/100ML-% IV SOLN
1000.0000 mg | Freq: Once | INTRAVENOUS | Status: AC
  Administered 2019-10-08: 1000 mg via INTRAVENOUS
  Filled 2019-10-08: qty 100

## 2019-10-08 MED ORDER — SODIUM CHLORIDE (PF) 0.9 % IJ SOLN
INTRAMUSCULAR | Status: DC | PRN
  Administered 2019-10-07: 12 mL/h via EPIDURAL

## 2019-10-08 MED ORDER — MISOPROSTOL 200 MCG PO TABS
400.0000 ug | ORAL_TABLET | Freq: Once | ORAL | Status: AC
  Administered 2019-10-08: 400 ug via BUCCAL

## 2019-10-08 MED ORDER — METOCLOPRAMIDE HCL 10 MG PO TABS: 10.0000 mg | ORAL_TABLET | Freq: Once | ORAL | Status: DC

## 2019-10-08 MED ORDER — LIDOCAINE HCL (PF) 1 % IJ SOLN
INTRAMUSCULAR | Status: DC | PRN
  Administered 2019-10-07: 6 mL via EPIDURAL

## 2019-10-08 MED ORDER — BENZOCAINE-MENTHOL 20-0.5 % EX AERO: 1.0000 "application " | INHALATION_SPRAY | CUTANEOUS | Status: DC | PRN

## 2019-10-08 MED ORDER — ACETAMINOPHEN 325 MG PO TABS
650.0000 mg | ORAL_TABLET | ORAL | Status: DC | PRN
  Administered 2019-10-09: 650 mg via ORAL
  Filled 2019-10-08: qty 2

## 2019-10-08 MED ORDER — DIBUCAINE (PERIANAL) 1 % EX OINT: 1.0000 "application " | TOPICAL_OINTMENT | CUTANEOUS | Status: DC | PRN

## 2019-10-08 MED ORDER — TETANUS-DIPHTH-ACELL PERTUSSIS 5-2.5-18.5 LF-MCG/0.5 IM SUSP: 0.5000 mL | Freq: Once | INTRAMUSCULAR | Status: DC

## 2019-10-08 MED ORDER — FAMOTIDINE 20 MG PO TABS
40.0000 mg | ORAL_TABLET | Freq: Once | ORAL | Status: AC
  Administered 2019-10-09: 40 mg via ORAL
  Filled 2019-10-08: qty 2

## 2019-10-08 MED ORDER — TRANEXAMIC ACID-NACL 1000-0.7 MG/100ML-% IV SOLN
INTRAVENOUS | Status: AC
  Filled 2019-10-08: qty 100

## 2019-10-08 MED ORDER — SENNOSIDES-DOCUSATE SODIUM 8.6-50 MG PO TABS
2.0000 | ORAL_TABLET | ORAL | Status: DC
  Administered 2019-10-09 – 2019-10-10 (×2): 2 via ORAL
  Filled 2019-10-08 (×2): qty 2

## 2019-10-08 MED ORDER — LACTATED RINGERS IV SOLN: INTRAVENOUS | Status: DC

## 2019-10-08 MED ORDER — COCONUT OIL OIL: 1.0000 "application " | TOPICAL_OIL | Status: DC | PRN

## 2019-10-08 MED ORDER — TRANEXAMIC ACID-NACL 1000-0.7 MG/100ML-% IV SOLN
1000.0000 mg | INTRAVENOUS | Status: AC
  Administered 2019-10-08: 1000 mg via INTRAVENOUS

## 2019-10-08 MED ORDER — ONDANSETRON HCL 4 MG/2ML IJ SOLN: 4.0000 mg | INTRAMUSCULAR | Status: DC | PRN

## 2019-10-08 MED ORDER — DIPHENHYDRAMINE HCL 50 MG/ML IJ SOLN: 12.5000 mg | INTRAMUSCULAR | Status: DC | PRN

## 2019-10-08 MED ORDER — INFLUENZA VAC SPLIT QUAD 0.5 ML IM SUSY
0.5000 mL | PREFILLED_SYRINGE | INTRAMUSCULAR | Status: AC
  Administered 2019-10-11: 0.5 mL via INTRAMUSCULAR
  Filled 2019-10-08: qty 0.5

## 2019-10-08 MED ORDER — MISOPROSTOL 200 MCG PO TABS
ORAL_TABLET | ORAL | Status: AC
  Filled 2019-10-08: qty 2

## 2019-10-08 MED ORDER — SOD CITRATE-CITRIC ACID 500-334 MG/5ML PO SOLN
30.0000 mL | Freq: Once | ORAL | Status: AC
  Administered 2019-10-09: 30 mL via ORAL
  Filled 2019-10-08: qty 30

## 2019-10-08 MED ORDER — PRENATAL MULTIVITAMIN CH
1.0000 | ORAL_TABLET | Freq: Every day | ORAL | Status: DC
  Administered 2019-10-09 – 2019-10-10 (×2): 1 via ORAL
  Filled 2019-10-08 (×2): qty 1

## 2019-10-08 MED ORDER — ZOLPIDEM TARTRATE 5 MG PO TABS: 5.0000 mg | ORAL_TABLET | Freq: Every evening | ORAL | Status: DC | PRN

## 2019-10-08 MED ORDER — WITCH HAZEL-GLYCERIN EX PADS: 1.0000 "application " | MEDICATED_PAD | CUTANEOUS | Status: DC | PRN

## 2019-10-08 MED ORDER — ONDANSETRON HCL 4 MG PO TABS
4.0000 mg | ORAL_TABLET | ORAL | Status: DC | PRN
  Administered 2019-10-09: 15:00:00 4 mg via ORAL
  Filled 2019-10-08: qty 1

## 2019-10-08 NOTE — Progress Notes (Addendum)
Regina Foley is a 33 y.o. [id number] at [redacted]w[redacted]d admitted for pre-term labor and cHTN with superimposed Pre-E.   Subjective: Feeling occasional contractions   Objective: BP 125/75   Pulse 99   Temp (!) 97.5 F (36.4 C) (Oral)   Resp 16   Wt 101.5 kg   LMP  (LMP Unknown)   SpO2 97%   BMI 36.11 kg/m  Total I/O In: 1133.2 [P.O.:360; I.V.:673.2; IV Piggyback:100] Out: 2000 [Urine:2000]  Gen: alert and oriented Pul: CTA Card: RRR Neuro: DTR 2+ patellar and brachial bilaterally.    FHT:  FHR: 135 bpm, variability: moderate,  accelerations:  Present,  decelerations:  Absent UC:   regular, every 4-5 minutes  SVE:   Dilation: 10 Effacement (%): 70 Station: 0 Exam by:: Dr. Darene Lamer   Labs: Lab Results  Component Value Date   WBC 15.6 (H) 10/07/2019   HGB 11.4 (L) 10/07/2019   HCT 34.7 (L) 10/07/2019   MCV 87.4 10/07/2019   PLT 222 10/07/2019    Assessment / Plan: Regina Foley is a 33 y.o. [id number] at [redacted]w[redacted]d admitted for pre-term labor and cHTN with superimposed Pre-E.   Labor:  SVE 10 cm @0345 . Holding on further exams given pre-term status.  cHTN with superimposed Pre-Eclampsia: Home med labetalol 300 mg tid. DTR and PE appropriate. Labetalol and hydralazine prn for BP > 160/110. Continuing Mag @ 2 g/hr  Fetal Wellbeing:  Category I Pain Control:  Epidural I/D:  PCN for for pre-term labor  Anticipated MOD:  NSVD  Hx of PPH: s/p TXA  x3 additional dose this pm Pre-term Labor: PCN ppx, s/p Betamethasone (10//27) with additional dose scheduled for this pm.     Maxie Better, MD, PGY1 Labor and Delivery Teaching Service 10/08/2019, 1:00 PM

## 2019-10-08 NOTE — Anesthesia Procedure Notes (Signed)
Epidural Patient location during procedure: OB Start time: 10/07/2019 11:58 PM End time: 10/08/2019 12:05 AM  Staffing Anesthesiologist: Janeece Riggers, MD  Preanesthetic Checklist Completed: patient identified, site marked, surgical consent, pre-op evaluation, timeout performed, IV checked, risks and benefits discussed and monitors and equipment checked  Epidural Patient position: sitting Prep: site prepped and draped and DuraPrep Patient monitoring: continuous pulse ox and blood pressure Approach: midline Location: L3-L4 Injection technique: LOR air  Needle:  Needle type: Tuohy  Needle gauge: 17 G Needle length: 9 cm and 9 Needle insertion depth: 7 cm Catheter type: closed end flexible Catheter size: 19 Gauge Catheter at skin depth: 12 cm Test dose: negative  Assessment Events: blood not aspirated, injection not painful, no injection resistance, negative IV test and no paresthesia

## 2019-10-08 NOTE — Progress Notes (Signed)
SVE complete/100/0 station. Start TXA Membranes still intact

## 2019-10-08 NOTE — Anesthesia Preprocedure Evaluation (Signed)
Anesthesia Evaluation  Patient identified by MRN, date of birth, ID band Patient awake    Reviewed: Allergy & Precautions, H&P , NPO status , Patient's Chart, lab work & pertinent test results, reviewed documented beta blocker date and time   Airway Mallampati: I  TM Distance: >3 FB Neck ROM: full    Dental no notable dental hx. (+) Teeth Intact   Pulmonary neg pulmonary ROS,    Pulmonary exam normal breath sounds clear to auscultation       Cardiovascular hypertension, negative cardio ROS Normal cardiovascular exam Rhythm:regular Rate:Normal     Neuro/Psych negative neurological ROS  negative psych ROS   GI/Hepatic negative GI ROS, Neg liver ROS,   Endo/Other  negative endocrine ROS  Renal/GU negative Renal ROS  negative genitourinary   Musculoskeletal   Abdominal   Peds  Hematology negative hematology ROS (+)   Anesthesia Other Findings   Reproductive/Obstetrics (+) Pregnancy                             Anesthesia Physical Anesthesia Plan  ASA: III  Anesthesia Plan: Epidural   Post-op Pain Management:    Induction:   PONV Risk Score and Plan:   Airway Management Planned:   Additional Equipment:   Intra-op Plan:   Post-operative Plan:   Informed Consent: I have reviewed the patients History and Physical, chart, labs and discussed the procedure including the risks, benefits and alternatives for the proposed anesthesia with the patient or authorized representative who has indicated his/her understanding and acceptance.     Dental Advisory Given  Plan Discussed with:   Anesthesia Plan Comments: (Labs checked- platelets confirmed with RN in room. Fetal heart tracing, per RN, reported to be stable enough for sitting procedure. Discussed epidural, and patient consents to the procedure:  included risk of possible headache,backache, failed block, allergic reaction, and nerve  injury. This patient was asked if she had any questions or concerns before the procedure started.)        Anesthesia Quick Evaluation

## 2019-10-08 NOTE — Discharge Summary (Signed)
Postpartum Discharge Summary     Patient Name: Regina Foley DOB: March 28, 1986 MRN: [id number]  Date of admission: 10/07/2019 Delivering Provider: Clarnce Flock   Date of discharge: 10/11/2019  Admitting diagnosis: CTX Intrauterine pregnancy: [redacted]w[redacted]d    Secondary diagnosis:  Principal Problem:   Severe pre-eclampsia, with delivery Active Problems:   History of postpartum hemorrhage, currently pregnant   Chronic hypertension affecting pregnancy   Hx of preeclampsia, prior pregnancy, currently pregnant   Supervision of high risk pregnancy, antepartum   Preterm labor   Anemia, postpartum   Status post bilateral partial salpingectomy   Additional problems:      Discharge diagnosis: Preterm Pregnancy Delivered, Preeclampsia (severe) and CHTN with superimposed preeclampsia                                                                                                Post partum procedures:postpartum tubal ligation  Augmentation: none  Complications: None  Hospital course:  Onset of Labor With Vaginal Delivery     33y.o. yo [id number] admitted in Active Labor on 10/07/2019. Patient had an uncomplicated labor course as follows: Patient arrived at 6cm dilation. She progressed spontaneously to fully dilated with a bulging bag. On admission started on penicillin and magensium and given dose of betamethasone. After becoming fully dilated patient had cessation of contractions and remained fully dilated for approximately 15 hours without further progress. Initial plan at that point to allow patient to become betamethasone complete as well as reach 34 weeks. Subsequently she had SROM during an episode of vomiting and decision made to proceed to delivery, had quick delivery after 12 minute second stage. Membrane Rupture Time/Date: 5:58 PM ,10/08/2019   Intrapartum Procedures: Episiotomy: None [1]                                         Lacerations:  None [1]  Patient had a delivery  of a Viable infant. 10/08/2019  Information for the patient's newborn:  CoGizell, Danser0[[id number]]Delivery Method: Vaginal, Spontaneous(Filed from Delivery Summary)     Pateint had an uncomplicated postpartum course aside from severe pain after post partum tubal ligation, pain managed with narcotics. She is ambulating, tolerating a regular diet, passing flatus, and urinating well. Patient is discharged home in stable condition on 10/11/19.  Delivery time: 6:16 PM    Magnesium Sulfate received: Yes BMZ received: Yes, one dose ~22 hours prior to delivery Rhophylac:N/A MMR:N/A Transfusion:No  Physical exam  Vitals:   10/10/19 1257 10/10/19 1559 10/10/19 2150 10/10/19 2152  BP: (!) 136/94 116/64  136/81  Pulse: 69 73  75  Resp: 17 18    Temp: 98.1 F (36.7 C) 98.6 F (37 C) 98.4 F (36.9 C)   TempSrc: Oral Oral Oral   SpO2: 100% 98%    Weight:      Height:       General: alert, cooperative and no distress Lochia: appropriate Uterine Fundus: firm Incision: Healing well with no significant  drainage, Dressing is clean, dry, and intact DVT Evaluation: No evidence of DVT seen on physical exam. Labs: Lab Results  Component Value Date   WBC 10.6 (H) 10/10/2019   HGB 9.9 (L) 10/10/2019   HCT 30.6 (L) 10/10/2019   MCV 87.4 10/10/2019   PLT 218 10/10/2019   CMP Latest Ref Rng & Units 10/10/2019  Glucose 70 - 99 mg/dL 105(H)  BUN 6 - 20 mg/dL 11  Creatinine 0.44 - 1.00 mg/dL 0.57  Sodium 135 - 145 mmol/L 137  Potassium 3.5 - 5.1 mmol/L 4.0  Chloride 98 - 111 mmol/L 104  CO2 22 - 32 mmol/L 23  Calcium 8.9 - 10.3 mg/dL 7.6(L)  Total Protein 6.5 - 8.1 g/dL 5.7(L)  Total Bilirubin 0.3 - 1.2 mg/dL 0.3  Alkaline Phos 38 - 126 U/L 81  AST 15 - 41 U/L 13(L)  ALT 0 - 44 U/L 11    Discharge instruction: per After Visit Summary and "Baby and Me Booklet".  After visit meds:  Allergies as of 10/11/2019      Reactions   Compazine [prochlorperazine Edisylate] Anaphylaxis,  Swelling   Swelling of the throat- took with metoclopramide, not sure which caused reaction   Diclegis [doxylamine-pyridoxine] Anaphylaxis, Swelling   Reglan [metoclopramide] Anaphylaxis, Swelling   Swelling of the throat- took with Compazine, not sure which caused reaction   Toradol [ketorolac Tromethamine] Anaphylaxis, Itching, Swelling   Throat and mouth became swollen & itched   Nifedipine Other (See Comments)   Severe headaches   Codeine Nausea And Vomiting      Medication List    STOP taking these medications   acetaminophen 500 MG tablet Commonly known as: TYLENOL   aspirin EC 81 MG tablet   labetalol 100 MG tablet Commonly known as: NORMODYNE   promethazine 25 MG tablet Commonly known as: PHENERGAN   scopolamine 1 MG/3DAYS Commonly known as: TRANSDERM-SCOP     TAKE these medications   gabapentin 100 MG capsule Commonly known as: NEURONTIN Take 1 capsule (100 mg total) by mouth 2 (two) times daily.   HYDROmorphone 2 MG tablet Commonly known as: Dilaudid Take 0.5 tablets (1 mg total) by mouth every 6 (six) hours as needed for severe pain.   multivitamin-prenatal 27-0.8 MG Tabs tablet Take 1 tablet by mouth daily.   nebivolol 10 MG tablet Commonly known as: BYSTOLIC Take 1 tablet (10 mg total) by mouth daily.   pantoprazole 40 MG tablet Commonly known as: PROTONIX Take 1 tablet (40 mg total) by mouth daily. What changed:   when to take this  reasons to take this       Diet: routine diet  Activity: Advance as tolerated. Pelvic rest for 6 weeks.   Outpatient follow up:6 weeks Follow up Appt: Future Appointments  Date Time Provider [place]  11/13/2019  2:10 PM Cresenzo-Dishmon, Joaquim Lai, CNM [facility]   Follow up Visit: Follow-up Information    [facility]. Schedule an appointment as soon as possible for a visit in 2 week(s).   Specialty: Obstetrics and Gynecology Why: if office has not called you Contact  information: [place] [id number] [phone]           Please schedule this patient for Postpartum visit in: 6 weeks with the following provider: Any provider  For C/S patients schedule nurse incision check in weeks 2 weeks: no  High risk pregnancy complicated by: cHTN, PreE with severe features, preterm delivery  Delivery mode: SVD  Anticipated Birth Control: BTL done PP  PP Procedures needed: BP check in 7-10 days, PP BTL incision check  Schedule Integrated BH visit: no     Newborn Data: Live born female  Birth Weight:  2850 grams APGAR: 4, 7  Newborn Delivery   Birth date/time: 10/08/2019 18:16:00 Delivery type: Vaginal, Spontaneous      Baby Feeding: Breast Disposition:NICU   10/11/2019 Sloan Leiter, MD

## 2019-10-08 NOTE — Progress Notes (Addendum)
Emmersen Heeke is a 33 y.o. [id number] at [redacted]w[redacted]d admitted for pre-term labor and cHTN with superimposed Pre-E.   Subjective: Feeling occasional contractions. Endorses worsening HA but no visual changes. Endorses feeling flushed.   Objective: BP 130/79   Pulse 91   Temp (!) 97.5 F (36.4 C) (Oral)   Resp 18   Wt 101.5 kg   LMP  (LMP Unknown)   SpO2 97%   BMI 36.11 kg/m  Total I/O In: 1743.9 [P.O.:660; I.V.:883.9; IV Piggyback:200] Out: 2850 [Urine:2850]  Gen: alert and oriented Pul: CTA Card: RRR Neuro: DTR Brachial 1+ bilaterally, Patellar L 0, R 1+   FHT:  FHR: 135 bpm, variability: moderate,  accelerations:  Present,  decelerations:  Absent UC:   regular, every 6-8  minutes  SVE:   Dilation: 10 Effacement (%): 70 Station: 0 Exam by:: Dr. Darene Lamer   Labs: Lab Results  Component Value Date   WBC 15.6 (H) 10/07/2019   HGB 11.4 (L) 10/07/2019   HCT 34.7 (L) 10/07/2019   MCV 87.4 10/07/2019   PLT 222 10/07/2019    Assessment / Plan: Carlinda Tape is a 33 y.o. [id number] at [redacted]w[redacted]d admitted for pre-term labor and cHTN with superimposed Pre-E.   Labor:  SVE 10 cm @0345 . Contractions remain intermittent q 10 min. Holding on further exams give pre-term status and need for additional betamethasone.  cHTN with superimposed Pre-Eclampsia: Home med labetalol 200 mg tid. DTR decreased on exam. Labetalol and hydralazine prn for BP > 160/110. Checking Mag level Fetal Wellbeing:  Category I Pain Control:  Epidural I/D:  PCN for for pre-term labor  Anticipated MOD:  NSVD  Headache: No hx of migraines or headache during pregnancy. Could be secondary to Pre-E or Mag toxicity. Tylenol PRN. If no improvement then phenergan.  Checking Mag level as per above.  Hx of PPH: s/p TXA  x3 (10/28) Pre-term Labor: PCN ppx, s/p Betamethasone (10//27) with additional dose scheduled for this pm.     Maxie Better, MD, PGY1 Labor and Delivery Teaching Service 10/08/2019, 3:01 PM

## 2019-10-08 NOTE — Progress Notes (Signed)
Regina Foley is a 33 y.o. [id number] at [redacted]w[redacted]d admitted for pre-term labor  Subjective: Feeling occasional contractions   Objective: BP (!) 126/56   Pulse (!) 103   Temp 97.9 F (36.6 C) (Oral)   Resp 18   Wt 101.5 kg   LMP  (LMP Unknown)   SpO2 97%   BMI 36.11 kg/m  Total I/O In: 638.1 [P.O.:120; I.V.:418.1; IV Piggyback:100] Out: 1150 [Urine:1150]  Gen: alert and oriented Pul: CTA Card: RRR Neuro: DTR 2+ patellar and brachial bilaterally.    FHT:  FHR: 135 bpm, variability: moderate,  accelerations:  Present,  decelerations:  Absent UC:   regular, every 4-5 minutes  SVE:   Dilation: 10 Effacement (%): 70 Station: 0 Exam by:: Dr. Darene Lamer   Labs: Lab Results  Component Value Date   WBC 15.6 (H) 10/07/2019   HGB 11.4 (L) 10/07/2019   HCT 34.7 (L) 10/07/2019   MCV 87.4 10/07/2019   PLT 222 10/07/2019    Assessment / Plan: 33 yo [id number] at 33.5 EGA in pre-term labor  Labor: Labor progressing.  Pre-Eclampsia: DTR and PE appropriate. Continuing Mag @ 2 g/hr  Fetal Wellbeing:  Category I Pain Control:  Epidural I/D:  GBS unknown, preterm labor progressing, PCN  Anticipated MOD:  NSVD  Hx of PPH: s/p TXA  x3 additional dose this pm Pre-term Labor: PCN ppx, s/p Betamethasone (10//27) with additional dose pm     Maxie Better, MD, PGY1 Labor and Delivery Teaching Service 10/08/2019, 10:16 AM

## 2019-10-09 ENCOUNTER — Inpatient Hospital Stay (HOSPITAL_COMMUNITY): Payer: 59 | Admitting: Anesthesiology

## 2019-10-09 ENCOUNTER — Encounter (HOSPITAL_COMMUNITY)
Admission: AD | Disposition: A | Discharge status: Home / Self Care | Payer: Self-pay | Source: Home / Self Care | Attending: Family Medicine

## 2019-10-09 ENCOUNTER — Other Ambulatory Visit: Payer: 59

## 2019-10-09 ENCOUNTER — Encounter: Payer: 59 | Admitting: Advanced Practice Midwife

## 2019-10-09 ENCOUNTER — Encounter: Payer: Self-pay | Admitting: *Deleted

## 2019-10-09 DIAGNOSIS — Z9079 Acquired absence of other genital organ(s): Secondary | ICD-10-CM

## 2019-10-09 DIAGNOSIS — O9081 Anemia of the puerperium: Secondary | ICD-10-CM | POA: Diagnosis present

## 2019-10-09 DIAGNOSIS — Z302 Encounter for sterilization: Secondary | ICD-10-CM

## 2019-10-09 HISTORY — PX: TUBAL LIGATION: SHX77

## 2019-10-09 LAB — CBC
HCT: 30.4 % — ABNORMAL LOW (ref 36.0–46.0)
Hemoglobin: 9.9 g/dL — ABNORMAL LOW (ref 12.0–15.0)
MCH: 28.6 pg (ref 26.0–34.0)
MCHC: 32.6 g/dL (ref 30.0–36.0)
MCV: 87.9 fL (ref 80.0–100.0)
Platelets: 237 10*3/uL (ref 150–400)
RBC: 3.46 MIL/uL — ABNORMAL LOW (ref 3.87–5.11)
RDW: 13.1 % (ref 11.5–15.5)
WBC: 16.1 10*3/uL — ABNORMAL HIGH (ref 4.0–10.5)
nRBC: 0 % (ref 0.0–0.2)

## 2019-10-09 SURGERY — LIGATION, FALLOPIAN TUBE, POSTPARTUM
Anesthesia: Spinal | Laterality: Bilateral

## 2019-10-09 MED ORDER — FENTANYL CITRATE (PF) 100 MCG/2ML IJ SOLN
INTRAMUSCULAR | Status: DC | PRN
  Administered 2019-10-09: 100 ug via INTRAVENOUS
  Administered 2019-10-09 (×4): 50 ug via INTRAVENOUS

## 2019-10-09 MED ORDER — FENTANYL CITRATE (PF) 100 MCG/2ML IJ SOLN
INTRAMUSCULAR | Status: AC
  Filled 2019-10-09: qty 2

## 2019-10-09 MED ORDER — LACTATED RINGERS IV SOLN
INTRAVENOUS | Status: DC
  Administered 2019-10-09: 02:00:00 75 mL/h via INTRAVENOUS
  Administered 2019-10-09: 12:00:00 via INTRAVENOUS

## 2019-10-09 MED ORDER — BUPIVACAINE IN DEXTROSE 0.75-8.25 % IT SOLN
INTRATHECAL | Status: DC | PRN
  Administered 2019-10-09: 1.4 mL via INTRATHECAL

## 2019-10-09 MED ORDER — FENTANYL CITRATE (PF) 100 MCG/2ML IJ SOLN
25.0000 ug | INTRAMUSCULAR | Status: DC | PRN
  Administered 2019-10-09 (×2): 25 ug via INTRAVENOUS
  Administered 2019-10-09 (×2): 50 ug via INTRAVENOUS

## 2019-10-09 MED ORDER — BUPIVACAINE HCL (PF) 0.5 % IJ SOLN
INTRAMUSCULAR | Status: DC | PRN
  Administered 2019-10-09: 30 mL

## 2019-10-09 MED ORDER — CHLOROPROCAINE HCL (PF) 3 % IJ SOLN
INTRAMUSCULAR | Status: DC | PRN
  Administered 2019-10-09: 30 mL

## 2019-10-09 MED ORDER — LACTATED RINGERS IV SOLN: INTRAVENOUS | Status: DC

## 2019-10-09 MED ORDER — FERROUS SULFATE 325 (65 FE) MG PO TABS
325.0000 mg | ORAL_TABLET | Freq: Two times a day (BID) | ORAL | Status: DC
  Administered 2019-10-09 – 2019-10-11 (×3): 325 mg via ORAL
  Filled 2019-10-09 (×3): qty 1

## 2019-10-09 MED ORDER — MIDAZOLAM HCL 5 MG/5ML IJ SOLN
INTRAMUSCULAR | Status: DC | PRN
  Administered 2019-10-09 (×2): 1 mg via INTRAVENOUS

## 2019-10-09 MED ORDER — MAGNESIUM SULFATE 40 GM/1000ML IV SOLN: 2.0000 g/h | INTRAVENOUS | Status: AC

## 2019-10-09 MED ORDER — BUPIVACAINE HCL (PF) 0.25 % IJ SOLN
INTRAMUSCULAR | Status: AC
  Filled 2019-10-09: qty 30

## 2019-10-09 MED ORDER — MEPERIDINE HCL 25 MG/ML IJ SOLN: 6.2500 mg | INTRAMUSCULAR | Status: DC | PRN

## 2019-10-09 MED ORDER — PROPOFOL 10 MG/ML IV BOLUS
INTRAVENOUS | Status: DC | PRN
  Administered 2019-10-09 (×2): 20 mg via INTRAVENOUS
  Administered 2019-10-09 (×2): 10 mg via INTRAVENOUS
  Administered 2019-10-09: 20 mg via INTRAVENOUS
  Administered 2019-10-09: 10 mg via INTRAVENOUS
  Administered 2019-10-09: 20 mg via INTRAVENOUS
  Administered 2019-10-09 (×4): 10 mg via INTRAVENOUS
  Administered 2019-10-09: 20 mg via INTRAVENOUS
  Administered 2019-10-09: 10 mg via INTRAVENOUS
  Administered 2019-10-09: 20 mg via INTRAVENOUS

## 2019-10-09 MED ORDER — OXYCODONE-ACETAMINOPHEN 5-325 MG PO TABS
1.0000 | ORAL_TABLET | Freq: Four times a day (QID) | ORAL | Status: DC | PRN
  Administered 2019-10-09: 2 via ORAL
  Administered 2019-10-09: 1 via ORAL
  Administered 2019-10-10: 2 via ORAL
  Filled 2019-10-09 (×3): qty 2

## 2019-10-09 SURGICAL SUPPLY — 24 items
BENZOIN TINCTURE PRP APPL 2/3 (GAUZE/BANDAGES/DRESSINGS) IMPLANT
BLADE SURG 11 STRL SS (BLADE) ×2 IMPLANT
CLOTH BEACON ORANGE TIMEOUT ST (SAFETY) ×2 IMPLANT
DRSG OPSITE POSTOP 3X4 (GAUZE/BANDAGES/DRESSINGS) ×2 IMPLANT
DURAPREP 26ML APPLICATOR (WOUND CARE) ×2 IMPLANT
ELECT REM PT RETURN 9FT ADLT (ELECTROSURGICAL) ×2
ELECTRODE REM PT RTRN 9FT ADLT (ELECTROSURGICAL) ×1 IMPLANT
GLOVE BIOGEL PI IND STRL 7.0 (GLOVE) ×3 IMPLANT
GLOVE BIOGEL PI INDICATOR 7.0 (GLOVE) ×3
GLOVE ECLIPSE 7.0 STRL STRAW (GLOVE) ×2 IMPLANT
GOWN STRL REUS W/TWL LRG LVL3 (GOWN DISPOSABLE) ×4 IMPLANT
NEEDLE HYPO 22GX1.5 SAFETY (NEEDLE) ×2 IMPLANT
NS IRRIG 1000ML POUR BTL (IV SOLUTION) ×2 IMPLANT
PACK ABDOMINAL MINOR (CUSTOM PROCEDURE TRAY) ×2 IMPLANT
PENCIL BUTTON HOLSTER BLD 10FT (ELECTRODE) ×2 IMPLANT
PROTECTOR NERVE ULNAR (MISCELLANEOUS) ×2 IMPLANT
SPONGE LAP 4X18 RFD (DISPOSABLE) IMPLANT
SUT VIC AB 0 CT1 27 (SUTURE) ×1
SUT VIC AB 0 CT1 27XBRD ANBCTR (SUTURE) ×1 IMPLANT
SUT VICRYL 4-0 PS2 18IN ABS (SUTURE) ×2 IMPLANT
SYR CONTROL 10ML LL (SYRINGE) ×2 IMPLANT
TOWEL OR 17X24 6PK STRL BLUE (TOWEL DISPOSABLE) ×4 IMPLANT
TRAY FOLEY CATH SILVER 14FR (SET/KITS/TRAYS/PACK) ×2 IMPLANT
WATER STERILE IRR 1000ML POUR (IV SOLUTION) ×2 IMPLANT

## 2019-10-09 NOTE — Transfer of Care (Signed)
Immediate Anesthesia Transfer of Care Note  Patient: Regina Foley  Procedure(s) Performed: POST PARTUM TUBAL LIGATION (Bilateral )  Patient Location: PACU  Anesthesia Type:Spinal  Level of Consciousness: awake, alert  and oriented  Airway & Oxygen Therapy: Patient Spontanous Breathing  Post-op Assessment: Report given to RN and Post -op Vital signs reviewed and stable  Post vital signs: Reviewed and stable  Last Vitals:  Vitals Value Taken Time  BP 125/83 10/09/19 1249  Temp    Pulse 85 10/09/19 1253  Resp 11 10/09/19 1253  SpO2 99 % 10/09/19 1253  Vitals shown include unvalidated device data.  Last Pain:  Vitals:   10/09/19 0830  TempSrc:   PainSc: 7       Patients Stated Pain Goal: 6 ([id number])  Complications: No apparent anesthesia complications

## 2019-10-09 NOTE — Progress Notes (Signed)
Patient screened out for psychosocial assessment since none of the following apply: °Psychosocial stressors documented in mother or baby's chart °Gestation less than 32 weeks °Code at delivery  °Infant with anomalies °Please contact the Clinical Social Worker if specific needs arise, by MOB's request, or if MOB scores greater than 9/yes to question 10 on Edinburgh Postpartum Depression Screen. ° °Regina Foley, MSW, LCSW °Clinical Social Work °[phone] °  °

## 2019-10-09 NOTE — Anesthesia Postprocedure Evaluation (Signed)
Anesthesia Post Note  Patient: Regina Foley  Procedure(s) Performed: AN AD HOC LABOR EPIDURAL     Patient location during evaluation: Mother Baby Anesthesia Type: Epidural Level of consciousness: awake and alert Pain management: pain level controlled Vital Signs Assessment: post-procedure vital signs reviewed and stable Respiratory status: spontaneous breathing, nonlabored ventilation and respiratory function stable Cardiovascular status: stable Postop Assessment: no headache, no backache and epidural receding Anesthetic complications: no Comments: Spoke to pt on the phone. No anesthetic complications noted.    Last Vitals:  Vitals:   10/09/19 0526 10/09/19 0758  BP: 130/82 131/76  Pulse: 82 85  Resp: 18 18  Temp: 36.8 C 36.6 C  SpO2: 100% 100%    Last Pain:  Vitals:   10/09/19 0758  TempSrc: Oral  PainSc:    Pain Goal: Patients Stated Pain Goal: 6 (10/08/19 2023)                 Riki Sheer

## 2019-10-09 NOTE — Op Note (Signed)
Regina Foley 10/09/2019  PREOPERATIVE DIAGNOSES: Multiparity, undesired fertility  POSTOPERATIVE DIAGNOSES: Multiparity, undesired fertility  PROCEDURE:  Postpartum Bilateral Partial Salpingectomy  SURGEON: Dr. Verita Schneiders  ASSISTANT: Dr. Clayton Lefort  ANESTHESIA:  Epidural and local analgesia using 30 ml of [id number] Marcaine  COMPLICATIONS:  None immediate.  ESTIMATED BLOOD LOSS: 10 ml.  INDICATIONS:  33 y.o. [id number] with undesired fertility, status post vaginal delivery, desires permanent sterilization with salpingectomy.  Other reversible forms of contraception were discussed with patient; she declines all other modalities. Risks of procedure discussed with patient including but not limited to: risk of regret, permanence of method, bleeding, infection, injury to surrounding organs and need for additional procedures.  Failure risk of less than 1% with increased risk of ectopic gestation if pregnancy occurs and possibility of post-tubal pain syndrome also discussed with patient.      FINDINGS:  Normal uterus, tubes, and ovaries.  Of note, there was some difficulty in getting adequate exposure to perform the salpingectomy.    PROCEDURE DETAILS: The patient was taken to the operating room where her epidural anesthesia was dosed up to surgical level and found to be adequate.  She was then placed in the dorsal supine position and prepped and draped in sterile fashion.  After an adequate timeout was performed, attention was turned to the patient's abdomen where a small transverse skin incision was made under the umbilical fold. The incision was taken down to the layer of fascia using the scalpel, and fascia was incised, and extended bilaterally using Mayo scissors. The peritoneum was entered in a sharp fashion. Attention was then turned to the patient's uterus, and left fallopian tube was then identified, and the Babcock clamp was then used to grasp the tube. Kelly forceps were placed on the  distal portion of the mesosalpinx underneath about 50-60% of the tube.  This pedicle was double suture ligated with 0 Vicryl, and the distal portion of the tube including the fimbriated end was excised.  The right fallopian tube was then identified, doubly ligated, and a the distal portion was excised in a similar fashion allowing for bilateral tubal sterilization via partial salpingectomy.  Good hemostasis was noted overall.  The instruments were then removed from the patient's abdomen and the fascial incision was repaired with 0 Vicryl, and the skin was closed with a 4-0 Vicryl subcuticular stitch. The patient tolerated the procedure well.  Instrument, sponge, and needle counts were correct times three.  The patient was then taken to the recovery room awake and in stable condition.   Verita Schneiders, MD, [place] for [employer], [place]

## 2019-10-09 NOTE — Progress Notes (Signed)
Post Partum Day 1 s/p SVD at [redacted]w[redacted]d after admission for PTL and cHTN with superimposed pre eclampsia with severe features.  Subjective: No complaints, tolerating PO and + flatus.  Patient denies any headaches, visual symptoms, RUQ/epigastric pain or other concerning symptoms. Desires PP BTL today, has been NPO since midnight.  Objective: Blood pressure 131/76, pulse 85, temperature 97.9 F (36.6 C), temperature source Oral, resp. rate 19, height 5\' 6"  (1.676 m), weight 101.2 kg, SpO2 100 %, unknown if currently breastfeeding.  Patient Vitals for the past 24 hrs:  BP Temp Temp src Pulse Resp SpO2 Height Weight  10/09/19 1000 - - - - 19 - - -  10/09/19 0900 - - - - 19 - - -  10/09/19 0800 - - - - 17 - - -  10/09/19 0758 131/76 97.9 F (36.6 C) Oral 85 18 100 % - -  10/09/19 0526 130/82 98.3 F (36.8 C) Oral 82 18 100 % - -  10/09/19 0021 (!) 147/71 97.9 F (36.6 C) Oral (!) 101 18 100 % - -  10/08/19 2036 (!) 146/88 - - 97 - - - -  10/08/19 2021 - - - - - 100 % - -  10/08/19 2016 - - - - - 100 % - -  10/08/19 2011 - - - - - 100 % - -  10/08/19 2005 (!) 145/105 98.7 F (37.1 C) Oral 99 20 99 % 5\' 6"  (1.676 m) 101.2 kg  10/08/19 1917 137/86 - - 97 18 - - -  10/08/19 1901 (!) 150/93 - - 90 18 - - -  10/08/19 1851 (!) 146/90 - - 97 16 - - -  10/08/19 1845 (!) 137/101 - - 95 18 - - -  10/08/19 1831 138/80 - - 99 18 - - -  10/08/19 1704 (!) 156/86 - - 95 18 - - -  10/08/19 1609 133/76 98 F (36.7 C) Oral 88 16 - - -  10/08/19 1536 129/78 - - 91 18 - - -  10/08/19 1505 (!) 142/89 - - 96 18 - - -  10/08/19 1436 130/79 - - 91 18 - - -  10/08/19 1405 135/80 - - 96 18 - - -  10/08/19 1340 129/80 - - 94 - - - -  10/08/19 1309 140/80 - - 96 18 - - -  10/08/19 1231 136/71 - - 100 - - - -  10/08/19 1201 125/75 (!) 97.5 F (36.4 C) Oral 99 16 - - -  10/08/19 1125 135/80 - - (!) 102 18 - - -  10/08/19 1103 125/60 - - 99 18 - - -   Physical Exam:  General: alert and no distress Lochia:  appropriate Uterine Fundus: firm DVT Evaluation: No evidence of DVT seen on physical exam. Negative Homan's sign. No significant calf/ankle edema.   CBC Latest Ref Rng & Units 10/09/2019 10/07/2019 10/05/2019  WBC 4.0 - 10.5 K/uL 16.1(H) 15.6(H) 14.5(H)  Hemoglobin 12.0 - 15.0 g/dL 9.9(L) 11.4(L) 11.2(L)  Hematocrit 36.0 - 46.0 % 30.4(L) 34.7(L) 33.9(L)  Platelets 150 - 400 K/uL 237 222 199   CMP Latest Ref Rng & Units 10/07/2019 10/05/2019 05/20/2019  Glucose 70 - 99 mg/dL 91 128(H) 94  BUN 6 - 20 mg/dL 9 8 11   Creatinine 0.44 - 1.00 mg/dL 0.53 0.56 0.59  Sodium 135 - 145 mmol/L 133(L) 139 138  Potassium 3.5 - 5.1 mmol/L 3.5 3.8 3.9  Chloride 98 - 111 mmol/L 103 108 101  CO2 22 -  32 mmol/L 18(L) 21(L) 19(L)  Calcium 8.9 - 10.3 mg/dL 8.9 9.6 10.0  Total Protein 6.5 - 8.1 g/dL 6.8 6.1(L) 7.1  Total Bilirubin 0.3 - 1.2 mg/dL 0.7 0.3 <0.2  Alkaline Phos 38 - 126 U/L 113 83 52  AST 15 - 41 U/L 15 15 17   ALT 0 - 44 U/L 11 10 11    Assessment/Plan:  Stable BP postpartum so far. Was on Bystolic (Nebivolol) 10 mg qd prior to pregnancy, can consider this if needed postpartum for [facility] management. Continue magnesium sulfate until 24 hours postpartum Patient desires permanent sterilization, desires salpingectomy.   Other reversible forms of contraception were discussed with patient; she declines all other modalities. She and her FOB also declined vasectomy.  Discussed technical difficulties of doing a bilateral salpingectomy via the incision used for this surgery, increased risk of bleeding.  Other risks of procedure discussed with patient including but not limited to: risk of regret, permanence of method, infection, injury to surrounding organs and need for additional procedures.  Failure risk of less 1% with increased risk of ectopic gestation if pregnancy occurs was also discussed with patient.  Discussed possibility of tubal ligation if salpingectomy is not able to be done, raises failure risk to  about 1-2%.  Patient verbalized understanding of these risks and wants to proceed with sterilization.  Written informed consent obtained.  To OR when ready.    LOS: 2 days   Verita Schneiders, MD 10/09/2019, 10:43 AM

## 2019-10-09 NOTE — Lactation Note (Signed)
This note was copied from a baby's chart. Lactation Consultation Note  Patient Name: Regina Foley [id number] Date: 10/09/2019 Reason for consult: Initial assessment;Preterm <34wks;NICU baby Randel Books is 61 hours old in the NICU.  Mom pumped and bottle fed her previous baby.  Mom just returned to room following a BTL.  Pumping has been initiated.  She obtained 15 mls of colostrum with first pumping.  Instructed to pump 8-12 times/24 hours.  No questions or concerns.  Reviewed lactation services.  Maternal Data    Feeding Feeding Type: Donor Breast Milk  LATCH Score                   Interventions    Lactation Tools Discussed/Used Initiated by:: RN Date initiated:: 10/08/19   Consult Status Consult Status: Follow-up Date: 10/10/19 Follow-up type: In-patient    Ave Filter 10/09/2019, 2:44 PM

## 2019-10-09 NOTE — Anesthesia Postprocedure Evaluation (Signed)
Anesthesia Post Note  Patient: Regina Foley  Procedure(s) Performed: POST PARTUM TUBAL LIGATION (Bilateral )     Patient location during evaluation: PACU Anesthesia Type: Spinal Level of consciousness: awake and alert Pain management: pain level controlled Vital Signs Assessment: post-procedure vital signs reviewed and stable Respiratory status: spontaneous breathing and respiratory function stable Cardiovascular status: blood pressure returned to baseline and stable Postop Assessment: no headache, no backache, spinal receding and no apparent nausea or vomiting Anesthetic complications: no    Last Vitals:  Vitals:   10/09/19 1345 10/09/19 1400  BP: 126/78 126/76  Pulse:    Resp: 15 17  Temp: 36.6 C 36.7 C  SpO2: 97%     Last Pain:  Vitals:   10/09/19 1401  TempSrc:   PainSc: 3    Pain Goal: Patients Stated Pain Goal: 6 (10/08/19 2023)  LLE Motor Response: Purposeful movement (10/09/19 1345) LLE Sensation: Tingling (10/09/19 1345) RLE Motor Response: Purposeful movement (10/09/19 1345) RLE Sensation: Tingling (10/09/19 1345)     Epidural/Spinal Function Cutaneous sensation: Vague (10/09/19 1401), Patient able to flex knees: Yes (10/09/19 1401), Patient able to lift hips off bed: Yes (10/09/19 1401), Back pain beyond tenderness at insertion site: No (10/09/19 1401), Progressively worsening motor and/or sensory loss: No (10/09/19 1401), Bowel and/or bladder incontinence post epidural: No (10/09/19 1345)  Montez Hageman

## 2019-10-09 NOTE — Anesthesia Preprocedure Evaluation (Signed)
Anesthesia Evaluation  Patient identified by MRN, date of birth, ID band Patient awake    Reviewed: Allergy & Precautions, NPO status , Patient's Chart, lab work & pertinent test results  Airway Mallampati: II  TM Distance: >3 FB Neck ROM: Full    Dental no notable dental hx.    Pulmonary neg pulmonary ROS,    Pulmonary exam normal breath sounds clear to auscultation       Cardiovascular hypertension, Normal cardiovascular exam Rhythm:Regular Rate:Normal     Neuro/Psych negative neurological ROS  negative psych ROS   GI/Hepatic negative GI ROS, Neg liver ROS,   Endo/Other  negative endocrine ROS  Renal/GU negative Renal ROS  negative genitourinary   Musculoskeletal negative musculoskeletal ROS (+)   Abdominal   Peds negative pediatric ROS (+)  Hematology negative hematology ROS (+)   Anesthesia Other Findings   Reproductive/Obstetrics negative OB ROS                             Anesthesia Physical Anesthesia Plan  ASA: II  Anesthesia Plan: Spinal   Post-op Pain Management:    Induction:   PONV Risk Score and Plan: 2 and Treatment may vary due to age or medical condition  Airway Management Planned: Natural Airway  Additional Equipment:   Intra-op Plan:   Post-operative Plan:   Informed Consent: I have reviewed the patients History and Physical, chart, labs and discussed the procedure including the risks, benefits and alternatives for the proposed anesthesia with the patient or authorized representative who has indicated his/her understanding and acceptance.     Dental advisory given  Plan Discussed with: CRNA  Anesthesia Plan Comments:         Anesthesia Quick Evaluation

## 2019-10-09 NOTE — Anesthesia Procedure Notes (Signed)
Spinal  Patient location during procedure: OR Staffing Anesthesiologist: Jerusalem Wert, MD Performed: anesthesiologist  Preanesthetic Checklist Completed: patient identified, site marked, surgical consent, pre-op evaluation, timeout performed, IV checked, risks and benefits discussed and monitors and equipment checked Spinal Block Patient position: sitting Prep: DuraPrep Patient monitoring: heart rate, continuous pulse ox and blood pressure Approach: midline Location: L3-4 Injection technique: single-shot Needle Needle type: Sprotte  Needle gauge: 24 G Needle length: 9 cm Additional Notes Expiration date of kit checked and confirmed. Patient tolerated procedure well, without complications.       

## 2019-10-09 NOTE — Progress Notes (Signed)
Post Partum Day 1 Subjective: Patient reports feeling well without complaints. She denies HA, visual changes, RUQ/epigastric pain. Patient very much interested in having her tubal ligation today  Objective: Blood pressure 131/76, pulse 85, temperature 97.9 F (36.6 C), temperature source Oral, resp. rate 19, height 5\' 6"  (1.676 m), weight 101.2 kg, SpO2 100 %, unknown if currently breastfeeding.  Physical Exam:  General: alert, cooperative and no distress Lochia: appropriate Uterine Fundus: firm DVT Evaluation: No evidence of DVT seen on physical exam.  Recent Labs    10/07/19 2035 10/09/19 0509  HGB 11.4* 9.9*  HCT 34.7* 30.4*    Assessment/Plan: 33 yo [id number] PPD#1 s/p SVD complicated by [facility] with superimposed preeclampsia currently on magnesium sulfate - Continue magnesium sulfate for 24 hours post delivery - Monitor BP closely and restart antihypertensive if indicated - Patient desires permanent sterilization. Risks and benefits of procedure discussed with patient including permanence of method, bleeding, infection, injury to surrounding organs and need for additional procedures. Risk failure of 0.5-1% with increased risk of ectopic gestation if pregnancy occurs was also discussed with patient. Patient verbalized understanding and all questions were answered. Patient has remained NPO overnight.     LOS: 2 days   Ulyess Muto 10/09/2019, 10:43 AM

## 2019-10-10 ENCOUNTER — Encounter (HOSPITAL_COMMUNITY): Payer: Self-pay | Admitting: Obstetrics & Gynecology

## 2019-10-10 LAB — CBC
HCT: 30.6 % — ABNORMAL LOW (ref 36.0–46.0)
Hemoglobin: 9.9 g/dL — ABNORMAL LOW (ref 12.0–15.0)
MCH: 28.3 pg (ref 26.0–34.0)
MCHC: 32.4 g/dL (ref 30.0–36.0)
MCV: 87.4 fL (ref 80.0–100.0)
Platelets: 218 10*3/uL (ref 150–400)
RBC: 3.5 MIL/uL — ABNORMAL LOW (ref 3.87–5.11)
RDW: 13.1 % (ref 11.5–15.5)
WBC: 10.6 10*3/uL — ABNORMAL HIGH (ref 4.0–10.5)
nRBC: 0 % (ref 0.0–0.2)

## 2019-10-10 LAB — COMPREHENSIVE METABOLIC PANEL
ALT: 11 U/L (ref 0–44)
AST: 13 U/L — ABNORMAL LOW (ref 15–41)
Albumin: 2.4 g/dL — ABNORMAL LOW (ref 3.5–5.0)
Alkaline Phosphatase: 81 U/L (ref 38–126)
Anion gap: 10 (ref 5–15)
BUN: 11 mg/dL (ref 6–20)
CO2: 23 mmol/L (ref 22–32)
Calcium: 7.6 mg/dL — ABNORMAL LOW (ref 8.9–10.3)
Chloride: 104 mmol/L (ref 98–111)
Creatinine, Ser: 0.57 mg/dL (ref 0.44–1.00)
GFR calc Af Amer: >60 mL/min (ref >60)
GFR calc non Af Amer: >60 mL/min (ref >60)
Glucose, Bld: 105 mg/dL — ABNORMAL HIGH (ref 70–99)
Potassium: 4 mmol/L (ref 3.5–5.1)
Sodium: 137 mmol/L (ref 135–145)
Total Bilirubin: 0.3 mg/dL (ref 0.3–1.2)
Total Protein: 5.7 g/dL — ABNORMAL LOW (ref 6.5–8.1)

## 2019-10-10 MED ORDER — ACETAMINOPHEN 500 MG PO TABS
1000.0000 mg | ORAL_TABLET | Freq: Four times a day (QID) | ORAL | Status: DC | PRN
  Administered 2019-10-10 – 2019-10-11 (×3): 1000 mg via ORAL
  Filled 2019-10-10 (×4): qty 2

## 2019-10-10 MED ORDER — HYDROMORPHONE HCL 2 MG PO TABS
2.0000 mg | ORAL_TABLET | ORAL | Status: DC | PRN
  Administered 2019-10-10: 4 mg via ORAL
  Administered 2019-10-10: 2 mg via ORAL
  Administered 2019-10-10 – 2019-10-11 (×3): 4 mg via ORAL
  Filled 2019-10-10 (×3): qty 2
  Filled 2019-10-10: qty 1
  Filled 2019-10-10 (×2): qty 2

## 2019-10-10 MED ORDER — NEBIVOLOL HCL 10 MG PO TABS
10.0000 mg | ORAL_TABLET | Freq: Every day | ORAL | Status: DC
  Administered 2019-10-10 – 2019-10-11 (×2): 10 mg via ORAL
  Filled 2019-10-10 (×2): qty 1

## 2019-10-10 MED ORDER — GABAPENTIN 100 MG PO CAPS
100.0000 mg | ORAL_CAPSULE | Freq: Two times a day (BID) | ORAL | Status: DC
  Administered 2019-10-10 – 2019-10-11 (×3): 100 mg via ORAL
  Filled 2019-10-10 (×3): qty 1

## 2019-10-10 NOTE — Lactation Note (Signed)
This note was copied from a baby's chart. Lactation Consultation Note  Patient Name: Regina Foley [id number] Date: 10/10/2019 Reason for consult: Follow-up assessment;NICU baby [name] now 78 hours old and born at 52 weeks and 6 days  Gestation.Mom using breastpump on arrival.  Reports pumping comfortable. However, Nipples appear to be rubbing sides of flanges.  However measured nipples post pumping and they measured 24 mm which is the flange size mom is using.  Reviewed pumping techniques and storage of breastmilk.  Urged pumping 8-12 times day. Mom reports she got to breastfeed him in the NICU and reports he did well she thought.  Mom haas Medela Pump n Style for home use.  Mom reports she recently switched insurances and thinks she can get a new pump.  Urged her to do so.  Urged her to call lactation as needed.  Maternal Data Does the patient have breastfeeding experience prior to this delivery?: Yes  Feeding Feeding Type: Donor Breast Milk  LATCH Score Latch: Grasps breast easily, tongue down, lips flanged, rhythmical sucking.  Audible Swallowing: Spontaneous and intermittent  Type of Nipple: Everted at rest and after stimulation  Comfort (Breast/Nipple): Soft / non-tender  Hold (Positioning): No assistance needed to correctly position infant at breast.  LATCH Score: 10  Interventions Interventions: Breast massage;Hand express  Lactation Tools Discussed/Used WIC Program: No Pump Review: Setup, frequency, and cleaning Date initiated:: 10/06/19   Consult Status Consult Status: Follow-up Date: 10/11/19 Follow-up type: In-patient    Prakash Kimberling Thompson Caul 10/10/2019, 2:11 PM

## 2019-10-10 NOTE — Progress Notes (Signed)
Post Partum Day 2 s/p SVD at [redacted]w[redacted]d after admission for PTL and cHTN with superimposed pre eclampsia with severe features. Also POD#1 PP Bilateral Salpingectomy   Subjective: Significant incisional/abdominal pain earlier this morning not adequately controlled. Tolerating food, no nausea/vomiting. Had flatus. No BM yet.   Patient denies any headaches, visual symptoms, RUQ/epigastric pain or other concerning symptoms.   Objective: Blood pressure 139/85, pulse 94, temperature 97.7 F (36.5 C), temperature source Oral, resp. rate 18, height 5\' 6"  (1.676 m), weight 101.2 kg, SpO2 98 %, unknown if currently breastfeeding.  Patient Vitals for the past 24 hrs:  BP Temp Temp src Pulse Resp SpO2  10/10/19 0631 139/85 97.7 F (36.5 C) Oral 94 18 98 %  10/09/19 2346 134/89 98.5 F (36.9 C) Oral 85 18 98 %  10/09/19 1948 137/87 98.3 F (36.8 C) Oral 83 19 98 %  10/09/19 1504 131/82 - - 80 20 -  10/09/19 1401 131/78 - - 79 (!) 22 -  10/09/19 1400 126/76 98 F (36.7 C) Oral - 17 -  10/09/19 1345 126/78 97.9 F (36.6 C) Oral - 15 97 %  10/09/19 1342 - - - - 16 -  10/09/19 1341 - - - - 14 -  10/09/19 1340 - - - - 14 -  10/09/19 1339 - - - - (!) 23 -  10/09/19 1338 - - - - 20 -  10/09/19 1337 - - - 96 18 96 %  10/09/19 1336 - - - 91 17 97 %  10/09/19 1335 - - - 94 18 96 %  10/09/19 1334 - - - 79 14 96 %  10/09/19 1333 - - - 85 16 98 %  10/09/19 1332 - - - 88 (!) 29 98 %  10/09/19 1331 - - - 90 18 97 %  10/09/19 1330 (!) 119/96 - - 82 14 95 %  10/09/19 1329 - - - 84 20 97 %  10/09/19 1328 - - - 77 11 92 %  10/09/19 1327 - - - 90 (!) 24 97 %  10/09/19 1326 - - - 86 16 96 %  10/09/19 1325 - - - 79 14 96 %  10/09/19 1324 - - - 80 15 95 %  10/09/19 1323 - - - 78 16 94 %  10/09/19 1322 - - - 82 16 98 %  10/09/19 1321 - - - 77 (!) 0 97 %  10/09/19 1320 - - - 78 12 96 %  10/09/19 1319 - - - 79 11 96 %  10/09/19 1318 - - - 83 11 91 %  10/09/19 1317 - - - 82 12 96 %  10/09/19 1316 - - - 82 (!) 6  96 %  10/09/19 1315 123/78 - - 86 12 97 %  10/09/19 1314 - - - 81 20 98 %  10/09/19 1313 - - - 79 13 98 %  10/09/19 1312 - - - 79 11 97 %  10/09/19 1311 - - - 86 12 97 %  10/09/19 1310 - - - 87 18 98 %  10/09/19 1309 - - - 85 17 98 %  10/09/19 1308 - - - 84 (!) 27 95 %  10/09/19 1307 - - - 87 (!) 24 98 %  10/09/19 1306 - - - 78 14 97 %  10/09/19 1305 - - - 82 15 98 %  10/09/19 1304 - - - 77 14 97 %  10/09/19 1303 - - - 80 17 98 %  10/09/19  1302 - - - 82 15 98 %  10/09/19 1301 - - - 78 10 97 %  10/09/19 1300 125/85 - - 82 14 97 %  10/09/19 1250 125/83 98 F (36.7 C) Oral 84 14 99 %  10/09/19 1100 - - - - 20 -  10/09/19 1000 - - - - 19 -  10/09/19 0900 - - - - 19 -  10/09/19 0800 - - - - 17 -  10/09/19 0758 131/76 97.9 F (36.6 C) Oral 85 18 100 %   Physical Exam:  General: alert and no distress Lochia: appropriate Abdomen: Moderately tender to touch around incision, incision C/D/I, no signs of bleeding Uterine Fundus: firm DVT Evaluation: No evidence of DVT seen on physical exam. Negative Homan's sign. No significant calf/ankle edema.   CBC Latest Ref Rng & Units 10/10/2019 10/09/2019 10/07/2019  WBC 4.0 - 10.5 K/uL 10.6(H) 16.1(H) 15.6(H)  Hemoglobin 12.0 - 15.0 g/dL 9.9(L) 9.9(L) 11.4(L)  Hematocrit 36.0 - 46.0 % 30.6(L) 30.4(L) 34.7(L)  Platelets 150 - 400 K/uL 218 237 222   CMP Latest Ref Rng & Units 10/10/2019 10/07/2019 10/05/2019  Glucose 70 - 99 mg/dL 105(H) 91 128(H)  BUN 6 - 20 mg/dL 11 9 8   Creatinine 0.44 - 1.00 mg/dL 0.57 0.53 0.56  Sodium 135 - 145 mmol/L 137 133(L) 139  Potassium 3.5 - 5.1 mmol/L 4.0 3.5 3.8  Chloride 98 - 111 mmol/L 104 103 108  CO2 22 - 32 mmol/L 23 18(L) 21(L)  Calcium 8.9 - 10.3 mg/dL 7.6(L) 8.9 9.6  Total Protein 6.5 - 8.1 g/dL 5.7(L) 6.8 6.1(L)  Total Bilirubin 0.3 - 1.2 mg/dL 0.3 0.7 0.3  Alkaline Phos 38 - 126 U/L 81 113 83  AST 15 - 41 U/L 13(L) 15 15  ALT 0 - 44 U/L 11 11 10    Assessment/Plan: Restarted on Bystolic  (Nebivolol) 10 mg qd for [facility] management. No signs or symptoms of worsening PEC. Dilaudid and Neurontin ordered for pain, also Tylenol. No signs of bleeding internally, Hgb stable at 9.9.  OOB recommended. Patient is concerned about pain level, wants to stay an extra day to monitor this and also her BP. This is reasonable given her severe PEC diagnosis, and given that a new medication was started today. Routine postpartum care.     LOS: 3 days   Verita Schneiders, MD 10/10/2019, 7:28 AM

## 2019-10-10 NOTE — MAU Provider Note (Signed)
Late entry: NST read  S: [name] is a 33 y.o. [phone] at [redacted]w[redacted]d who presents to MAU today for decreased fetal movement.     Fetal Monitoring: Baseline: 150 bpm Variability: moderate Accelerations: present, 15x15 Decelerations: none Contractions: none  MDM NST reviewed.   A: SIUP at [redacted]w[redacted]d   P: NST reactive See MAU provider note  Zinia Innocent, [place], DO 10/10/2019 1:04 PM

## 2019-10-10 NOTE — Progress Notes (Signed)
Called Dr. Jordan Hawks @ pt's request.  Pt c/o severe abdominal pain with little relief from percocet.  Pt sitting up at the side of bed in tears for pain.  Pt encouraged to ambulate frequently around the unit, pt verbalized understanding, but insist on seeing a doctor at this time.  As per [facility] will see pt during rounds this Am and to give 2 Percocets at this time.  New orders recv'd.

## 2019-10-11 ENCOUNTER — Ambulatory Visit: Payer: Self-pay

## 2019-10-11 MED ORDER — NEBIVOLOL HCL 10 MG PO TABS: 10.0000 mg | ORAL_TABLET | Freq: Every day | ORAL | 2 refills | Status: AC

## 2019-10-11 MED ORDER — GABAPENTIN 100 MG PO CAPS: 100.0000 mg | ORAL_CAPSULE | Freq: Two times a day (BID) | ORAL | 1 refills | Status: DC

## 2019-10-11 MED ORDER — HYDROMORPHONE HCL 2 MG PO TABS: 1.0000 mg | ORAL_TABLET | Freq: Four times a day (QID) | ORAL | 0 refills | Status: DC | PRN

## 2019-10-11 NOTE — Lactation Note (Signed)
This note was copied from a baby's chart. Lactation Consultation Note  Patient Name: Regina Foley [id number] Date: 10/11/2019   [facility] entered mom's room on first floor, mother absent at this time.  Cranston Neighbor 10/11/2019, 8:37 AM

## 2019-10-11 NOTE — Progress Notes (Signed)
D/c teaching complete  Pt to stay in nicu with baby  States she lives in [place]  And will have to travel  To see baby

## 2019-10-11 NOTE — Lactation Note (Signed)
This note was copied from a baby's chart. Lactation Consultation Note  Patient Name: Regina Foley [id number] Date: 10/11/2019   [facility] presented to mom's room on first floor and mom still absent.   Rosaura Carpenter Ector Laurel 10/11/2019, 1:14 PM

## 2019-10-11 NOTE — Plan of Care (Signed)
Pt to go to nicu to stay with baby

## 2019-10-11 NOTE — Lactation Note (Signed)
This note was copied from a baby's chart. Lactation Consultation Note  Patient Name: Regina Foley [id number] Date: 10/11/2019 Reason for consult: Follow-up assessment   Baby 63 hours old in NICU.  [redacted]w[redacted]d.   Mother complaining that her R breast is engorged. RN called because mother wanted to know if Medela Symphony could pump one breast at a time. Provided information that cap and tubing of one side could be removed.  Mother stated she noted less suction when she did that. Recommend turning pump suction up which mother tried. Mother wanted to pump only one breast because she breastfeeds on one breast and then pumps the other.  She states she had a large volume of breastmilk with her last child. Assisted latching Regina Foley on R breast to relieve engorgement. LC applied warm moist heat to breast while Vincent breastfed. Feeding was observed for more than 15 min with swallows heard and observed. Breast softened.  Mother will follow up with pumping after feeding. Mother has been pumping approx 4 oz per session. Mother will call if further assistance is needed.     Maternal Data    Feeding Feeding Type: Breast Fed  LATCH Score Latch: Grasps breast easily, tongue down, lips flanged, rhythmical sucking.  Audible Swallowing: Spontaneous and intermittent  Type of Nipple: Everted at rest and after stimulation  Comfort (Breast/Nipple): Soft / non-tender  Hold (Positioning): Assistance needed to correctly position infant at breast and maintain latch.  LATCH Score: 9  Interventions Interventions: Breast feeding basics reviewed;Assisted with latch;Breast compression  Lactation Tools Discussed/Used     Consult Status Consult Status: Follow-up Date: 10/12/19 Follow-up type: In-patient    Regina Foley [facility] 10/11/2019, 5:19 PM

## 2019-10-13 ENCOUNTER — Telehealth: Payer: Self-pay | Admitting: *Deleted

## 2019-10-13 LAB — SURGICAL PATHOLOGY

## 2019-10-13 NOTE — Telephone Encounter (Signed)
Patient left message requesting that Tish call her. She needs to tell her something about the FMLA papers that she has faxed to [place].

## 2019-10-13 NOTE — Telephone Encounter (Signed)
Patient states she is starting maternity leave on 10/28 and would like to be out until 12/12/19.

## 2019-10-14 ENCOUNTER — Other Ambulatory Visit: Payer: 59

## 2019-10-15 ENCOUNTER — Other Ambulatory Visit: Payer: Self-pay

## 2019-10-15 ENCOUNTER — Inpatient Hospital Stay (HOSPITAL_COMMUNITY)
Admission: AD | Admit: 2019-10-15 | Discharge: 2019-10-15 | Disposition: A | Discharge status: Home / Self Care | Payer: 59 | Attending: Obstetrics & Gynecology | Admitting: Obstetrics & Gynecology

## 2019-10-15 ENCOUNTER — Telehealth (INDEPENDENT_AMBULATORY_CARE_PROVIDER_SITE_OTHER): Payer: 59 | Admitting: *Deleted

## 2019-10-15 ENCOUNTER — Encounter (HOSPITAL_COMMUNITY): Payer: Self-pay

## 2019-10-15 ENCOUNTER — Telehealth: Payer: 59

## 2019-10-15 VITALS — BP 147/105

## 2019-10-15 DIAGNOSIS — Z8674 Personal history of sudden cardiac arrest: Secondary | ICD-10-CM | POA: Insufficient documentation

## 2019-10-15 DIAGNOSIS — Z833 Family history of diabetes mellitus: Secondary | ICD-10-CM | POA: Insufficient documentation

## 2019-10-15 DIAGNOSIS — R519 Headache, unspecified: Secondary | ICD-10-CM | POA: Diagnosis not present

## 2019-10-15 DIAGNOSIS — Z79899 Other long term (current) drug therapy: Secondary | ICD-10-CM | POA: Diagnosis not present

## 2019-10-15 DIAGNOSIS — Z8541 Personal history of malignant neoplasm of cervix uteri: Secondary | ICD-10-CM | POA: Insufficient documentation

## 2019-10-15 DIAGNOSIS — Z0130 Encounter for examination of blood pressure without abnormal findings: Secondary | ICD-10-CM

## 2019-10-15 DIAGNOSIS — Z9851 Tubal ligation status: Secondary | ICD-10-CM | POA: Diagnosis not present

## 2019-10-15 DIAGNOSIS — R030 Elevated blood-pressure reading, without diagnosis of hypertension: Secondary | ICD-10-CM | POA: Diagnosis present

## 2019-10-15 DIAGNOSIS — Z885 Allergy status to narcotic agent status: Secondary | ICD-10-CM | POA: Diagnosis not present

## 2019-10-15 DIAGNOSIS — Z841 Family history of disorders of kidney and ureter: Secondary | ICD-10-CM | POA: Diagnosis not present

## 2019-10-15 DIAGNOSIS — O99893 Other specified diseases and conditions complicating puerperium: Secondary | ICD-10-CM

## 2019-10-15 DIAGNOSIS — Z8249 Family history of ischemic heart disease and other diseases of the circulatory system: Secondary | ICD-10-CM | POA: Diagnosis not present

## 2019-10-15 DIAGNOSIS — Z888 Allergy status to other drugs, medicaments and biological substances status: Secondary | ICD-10-CM | POA: Insufficient documentation

## 2019-10-15 DIAGNOSIS — Z825 Family history of asthma and other chronic lower respiratory diseases: Secondary | ICD-10-CM | POA: Diagnosis not present

## 2019-10-15 DIAGNOSIS — I10 Essential (primary) hypertension: Secondary | ICD-10-CM

## 2019-10-15 LAB — COMPREHENSIVE METABOLIC PANEL
ALT: 26 U/L (ref 0–44)
AST: 22 U/L (ref 15–41)
Albumin: 3.1 g/dL — ABNORMAL LOW (ref 3.5–5.0)
Alkaline Phosphatase: 75 U/L (ref 38–126)
Anion gap: 11 (ref 5–15)
BUN: 11 mg/dL (ref 6–20)
CO2: 23 mmol/L (ref 22–32)
Calcium: 8.7 mg/dL — ABNORMAL LOW (ref 8.9–10.3)
Chloride: 108 mmol/L (ref 98–111)
Creatinine, Ser: 0.76 mg/dL (ref 0.44–1.00)
GFR calc Af Amer: >60 mL/min (ref >60)
GFR calc non Af Amer: >60 mL/min (ref >60)
Glucose, Bld: 83 mg/dL (ref 70–99)
Potassium: 3.9 mmol/L (ref 3.5–5.1)
Sodium: 142 mmol/L (ref 135–145)
Total Bilirubin: 0.8 mg/dL (ref 0.3–1.2)
Total Protein: 6.3 g/dL — ABNORMAL LOW (ref 6.5–8.1)

## 2019-10-15 LAB — CBC
HCT: 37.1 % (ref 36.0–46.0)
Hemoglobin: 11.9 g/dL — ABNORMAL LOW (ref 12.0–15.0)
MCH: 28.1 pg (ref 26.0–34.0)
MCHC: 32.1 g/dL (ref 30.0–36.0)
MCV: 87.5 fL (ref 80.0–100.0)
Platelets: 304 10*3/uL (ref 150–400)
RBC: 4.24 MIL/uL (ref 3.87–5.11)
RDW: 12.6 % (ref 11.5–15.5)
WBC: 8.4 10*3/uL (ref 4.0–10.5)
nRBC: 0 % (ref 0.0–0.2)

## 2019-10-15 LAB — PROTEIN / CREATININE RATIO, URINE
Creatinine, Urine: 151.56 mg/dL
Protein Creatinine Ratio: 0.09 mg/mg{Cre} (ref 0.00–0.15)
Total Protein, Urine: 14 mg/dL

## 2019-10-15 LAB — MAGNESIUM: Magnesium: 2.4 mg/dL (ref 1.7–2.4)

## 2019-10-15 MED ORDER — ENALAPRIL MALEATE 10 MG PO TABS: 5.0000 mg | ORAL_TABLET | Freq: Two times a day (BID) | ORAL | 11 refills | Status: DC

## 2019-10-15 MED ORDER — OXYCODONE HCL 5 MG PO TABS
5.0000 mg | ORAL_TABLET | Freq: Once | ORAL | Status: AC
  Administered 2019-10-15: 5 mg via ORAL
  Filled 2019-10-15: qty 1

## 2019-10-15 MED ORDER — ENALAPRIL MALEATE 5 MG PO TABS
5.0000 mg | ORAL_TABLET | Freq: Once | ORAL | Status: AC
  Administered 2019-10-15: 5 mg via ORAL
  Filled 2019-10-15: qty 1

## 2019-10-15 NOTE — MAU Provider Note (Signed)
History     CSN: [id number]  Arrival date and time: 10/15/19 1241   None     Chief Complaint  Patient presents with  . BP Eval   HPI Regina Foley is a 33 yo [id number] who is PPD#7 s/p NSVD on 10/08/19, s/p 24 hours mag after delivery, s/p BTL on PPD#1, who presented with elevated BPs from NICU after virtual nurse visit.  She does endorse a headache that comes and goes, starts in the back of her neck and radiates up her scalp bilaterally. She had been taking tylenol and motrin, has not picked up the dilaudid. She continues to have high BPs since discharge, is currently on 10 mg bystolic, which she was on prior to pregnancy. Her cHTN was not well controlled on labetalol. She has tried nifedipine and amlodipine in the past, but both of those gave her headaches.   She does endorse some vague abdominal pain, which has been there since her postpartum tubal. She reports that the motrin helps with it, but she only gets pain in the RUQ when she sits/stands up.  OB History    Gravida  3   Para  3   Term  1   Preterm  2   AB      Living  3     SAB      TAB      Ectopic      Multiple  0   Live Births  3           Past Medical History:  Diagnosis Date  . Anemia   . Blood transfusion without reported diagnosis   . Cardiac arrest ([place])   . Cervical cancer ([place])   . Degenerative disc disease, thoracic   . Hypertension   . Intervertebral disk disease   . Pregnancy induced hypertension     Past Surgical History:  Procedure Laterality Date  . APPENDECTOMY    . APPENDECTOMY    . CERVICAL CONE BIOPSY    . CHOLECYSTECTOMY    . LEEP    . Ovarian cyst     drained  . TUBAL LIGATION Bilateral 10/09/2019   Procedure: POST PARTUM TUBAL LIGATION;  Surgeon: Osborne Oman, MD;  Location: [facility] LD ORS;  Service: Gynecology;  Laterality: Bilateral;    Family History  Problem Relation Age of Onset  . Scoliosis Mother   . Heart disease Father   . Hypertension Father   . Diabetes  Father   . Hyperlipidemia Father   . COPD Father   . Asthma Father   . Kidney disease Father   . Diabetes Brother   . Diabetes Sister   . Diabetes Maternal Grandmother   . COPD Maternal Grandfather   . Diabetes Paternal Grandmother     Social History   Tobacco Use  . Smoking status: Never Smoker  . Smokeless tobacco: Never Used  Substance Use Topics  . Alcohol use: No    Frequency: Never  . Drug use: No    Allergies:  Allergies  Allergen Reactions  . Compazine [Prochlorperazine Edisylate] Anaphylaxis and Swelling    Swelling of the throat- took with metoclopramide, not sure which caused reaction  . Diclegis [Doxylamine-Pyridoxine] Anaphylaxis and Swelling  . Reglan [Metoclopramide] Anaphylaxis and Swelling    Swelling of the throat- took with Compazine, not sure which caused reaction  . Toradol [Ketorolac Tromethamine] Anaphylaxis, Itching and Swelling    Throat and mouth became swollen & itched  . Nifedipine Other (See Comments)  Severe headaches  . Codeine Nausea And Vomiting    Medications Prior to Admission  Medication Sig Dispense Refill Last Dose  . gabapentin (NEURONTIN) 100 MG capsule Take 1 capsule (100 mg total) by mouth 2 (two) times daily. 60 capsule 1   . HYDROmorphone (DILAUDID) 2 MG tablet Take 0.5 tablets (1 mg total) by mouth every 6 (six) hours as needed for severe pain. 20 tablet 0   . nebivolol (BYSTOLIC) 10 MG tablet Take 1 tablet (10 mg total) by mouth daily. 30 tablet 2   . pantoprazole (PROTONIX) 40 MG tablet Take 1 tablet (40 mg total) by mouth daily. (Patient taking differently: Take 40 mg by mouth daily as needed (for reflux symptoms). ) 30 tablet 3   . Prenatal Vit-Fe Fumarate-FA (MULTIVITAMIN-PRENATAL) 27-0.8 MG TABS tablet Take 1 tablet by mouth daily.        Review of Systems  All other systems reviewed and are negative.  Physical Exam   Blood pressure 138/87, pulse 72, temperature 98.2 F (36.8 C), temperature source Oral, resp.  rate 20, SpO2 98 %, currently breastfeeding.  Physical Exam  Nursing note and vitals reviewed. Constitutional: She is oriented to person, place, and time. She appears well-developed and well-nourished.  HENT:  Head: Normocephalic and atraumatic.  Hypertonic paraspinal muscles palpated in the cervical region of the spine, trigger points located at the origin of the splenius capitus on the left and right  Eyes: Pupils are equal, round, and reactive to light. Conjunctivae and EOM are normal.  Neck: Normal range of motion. Neck supple.  Cardiovascular: Normal rate, regular rhythm, normal heart sounds and intact distal pulses.  Respiratory: Effort normal and breath sounds normal.  GI: Soft. Bowel sounds are normal.  Musculoskeletal: Normal range of motion.  Neurological: She is alert and oriented to person, place, and time. She has normal reflexes.  Skin: Skin is warm and dry.  Psychiatric: She has a normal mood and affect. Her behavior is normal. Judgment and thought content normal.    MAU Course  Procedures  MDM -Pre-E labs -case d/w Dr. Roselie Awkward, post partum Pre-E unlikely (patient pre-E labs were normal around delivery, sf were HA and visual changes, cHTN was not well controlled during pregnancy) -Enalapril 5 mg trial -took Bystolic already today -declines occipital nerve block, declines flexeril as she has some at home  Results for orders placed or performed during the hospital encounter of 10/15/19 (from the past 24 hour(s))  Comprehensive metabolic panel     Status: Abnormal   Collection Time: 10/15/19  3:17 PM  Result Value Ref Range   Sodium 142 135 - 145 mmol/L   Potassium 3.9 3.5 - 5.1 mmol/L   Chloride 108 98 - 111 mmol/L   CO2 23 22 - 32 mmol/L   Glucose, Bld 83 70 - 99 mg/dL   BUN 11 6 - 20 mg/dL   Creatinine, Ser 0.76 0.44 - 1.00 mg/dL   Calcium 8.7 (L) 8.9 - 10.3 mg/dL   Total Protein 6.3 (L) 6.5 - 8.1 g/dL   Albumin 3.1 (L) 3.5 - 5.0 g/dL   AST 22 15 - 41 U/L    ALT 26 0 - 44 U/L   Alkaline Phosphatase 75 38 - 126 U/L   Total Bilirubin 0.8 0.3 - 1.2 mg/dL   GFR calc non Af Amer >60 >60 mL/min   GFR calc Af Amer >60 >60 mL/min   Anion gap 11 5 - 15  CBC     Status: Abnormal  Collection Time: 10/15/19  3:17 PM  Result Value Ref Range   WBC 8.4 4.0 - 10.5 K/uL   RBC 4.24 3.87 - 5.11 MIL/uL   Hemoglobin 11.9 (L) 12.0 - 15.0 g/dL   HCT 37.1 36.0 - 46.0 %   MCV 87.5 80.0 - 100.0 fL   MCH 28.1 26.0 - 34.0 pg   MCHC 32.1 30.0 - 36.0 g/dL   RDW 12.6 11.5 - 15.5 %   Platelets 304 150 - 400 K/uL   nRBC 0.0 0.0 - 0.2 %  Magnesium     Status: None   Collection Time: 10/15/19  3:17 PM  Result Value Ref Range   Magnesium 2.4 1.7 - 2.4 mg/dL  Protein / creatinine ratio, urine     Status: None   Collection Time: 10/15/19  4:47 PM  Result Value Ref Range   Creatinine, Urine 151.56 mg/dL   Total Protein, Urine 14 mg/dL   Protein Creatinine Ratio 0.09 0.00 - 0.15 mg/mg[Cre]    Assessment and Plan  33 yo [id number] who is PPD#7 s/p NSVD, s/p 24 hours mag after delivery, s/p BTL on PPD#1, who presented with elevated BPs from NICU after virtual nurse visit -Pre-E labs normal -BP improved with enalapril, patient will monitor her BP and if she needs to take it BID she will do so.  -Message sent to clinic to schedule a BP check in 1 week -Strict return precautions given -May take flexeril for occipital headache  Ammanda Dobbins L Nura Cahoon 10/15/2019, 4:57 PM

## 2019-10-15 NOTE — MAU Note (Signed)
Pt sent for BP evaluation.  Pt had virtual appt today and BP was elevated during visit.  Pt reports H/A, visual disturbances, and intermittent epigastric pain.  Also reports N&V after meals and episodes of diarrhea. S/P vaginal delivery 10/08/19.  Pt reports had Pre Eclampsia during the pregnancy.

## 2019-10-15 NOTE — Progress Notes (Signed)
   NURSE VISIT- BLOOD PRESSURE CHECK  SUBJECTIVE:  Regina Foley is a 33 y.o. [id number] female here for BP check. She is postpartum, delivery date 10/08/19    HYPERTENSION ROS: Postpartum:  . Severe headaches that don't go away with tylenol/other medicines: Yes . Visual changes (seeing spots/double/blurred vision) Yes  . Severe pain under right breast breast or in center of upper chest Yes "comes and goes" . Severe nausea/vomiting No but "stomach is upset" . Taking medicines as instructed yes   OBJECTIVE:  BP (!) 147/105   Breastfeeding Yes   Appearance alert, well appearing, and in no distress and oriented to person, place, and time.  ASSESSMENT: Postpartum  blood pressure check  PLAN: Discussed with Dr. Rip Harbour   Recommendations: Go to MAU for evaluation   Follow-up: depending on MAU eval   MAU notified. Patient currently in NICU.   Kristeen Miss Cresenzo  10/15/2019 10:39 AM

## 2019-10-15 NOTE — Discharge Instructions (Signed)
Managing Your Hypertension Hypertension is commonly called high blood pressure. This is when the force of your blood pressing against the walls of your arteries is too strong. Arteries are blood vessels that carry blood from your heart throughout your body. Hypertension forces the heart to work harder to pump blood, and may cause the arteries to become narrow or stiff. Having untreated or uncontrolled hypertension can cause heart attack, stroke, kidney disease, and other problems. What are blood pressure readings? A blood pressure reading consists of a higher number over a lower number. Ideally, your blood pressure should be below 120/80. The first ("top") number is called the systolic pressure. It is a measure of the pressure in your arteries as your heart beats. The second ("bottom") number is called the diastolic pressure. It is a measure of the pressure in your arteries as the heart relaxes. What does my blood pressure reading mean? Blood pressure is classified into four stages. Based on your blood pressure reading, your health care provider may use the following stages to determine what type of treatment you need, if any. Systolic pressure and diastolic pressure are measured in a unit called mm Hg. Normal  Systolic pressure: below 078.  Diastolic pressure: below 80. Elevated  Systolic pressure: 675-449.  Diastolic pressure: below 80. Hypertension stage 1  Systolic pressure: 201-007.  Diastolic pressure: 12-19. Hypertension stage 2  Systolic pressure: 758 or above.  Diastolic pressure: 90 or above. What health risks are associated with hypertension? Managing your hypertension is an important responsibility. Uncontrolled hypertension can lead to:  A heart attack.  A stroke.  A weakened blood vessel (aneurysm).  Heart failure.  Kidney damage.  Eye damage.  Metabolic syndrome.  Memory and concentration problems. What changes can I make to manage my  hypertension? Hypertension can be managed by making lifestyle changes and possibly by taking medicines. Your health care provider will help you make a plan to bring your blood pressure within a normal range. Eating and drinking   Eat a diet that is high in fiber and potassium, and low in salt (sodium), added sugar, and fat. An example eating plan is called the DASH (Dietary Approaches to Stop Hypertension) diet. To eat this way: ? Eat plenty of fresh fruits and vegetables. Try to fill half of your plate at each meal with fruits and vegetables. ? Eat whole grains, such as whole wheat pasta, brown rice, or whole grain bread. Fill about one quarter of your plate with whole grains. ? Eat low-fat diary products. ? Avoid fatty cuts of meat, processed or cured meats, and poultry with skin. Fill about one quarter of your plate with lean proteins such as fish, chicken without skin, beans, eggs, and tofu. ? Avoid premade and processed foods. These tend to be higher in sodium, added sugar, and fat.  Reduce your daily sodium intake. Most people with hypertension should eat less than 1,500 mg of sodium a day.  Limit alcohol intake to no more than 1 drink a day for nonpregnant women and 2 drinks a day for men. One drink equals 12 oz of beer, 5 oz of wine, or 1 oz of hard liquor. Lifestyle  Work with your health care provider to maintain a healthy body weight, or to lose weight. Ask what an ideal weight is for you.  Get at least 30 minutes of exercise that causes your heart to beat faster (aerobic exercise) most days of the week. Activities may include walking, swimming, or biking.  Include exercise  to strengthen your muscles (resistance exercise), such as weight lifting, as part of your weekly exercise routine. Try to do these types of exercises for 30 minutes at least 3 days a week.  Do not use any products that contain nicotine or tobacco, such as cigarettes and e-cigarettes. If you need help quitting,  ask your health care provider.  Control any long-term (chronic) conditions you have, such as high cholesterol or diabetes. Monitoring  Monitor your blood pressure at home as told by your health care provider. Your personal target blood pressure may vary depending on your medical conditions, your age, and other factors.  Have your blood pressure checked regularly, as often as told by your health care provider. Working with your health care provider  Review all the medicines you take with your health care provider because there may be side effects or interactions.  Talk with your health care provider about your diet, exercise habits, and other lifestyle factors that may be contributing to hypertension.  Visit your health care provider regularly. Your health care provider can help you create and adjust your plan for managing hypertension. Will I need medicine to control my blood pressure? Your health care provider may prescribe medicine if lifestyle changes are not enough to get your blood pressure under control, and if:  Your systolic blood pressure is 130 or higher.  Your diastolic blood pressure is 80 or higher. Take medicines only as told by your health care provider. Follow the directions carefully. Blood pressure medicines must be taken as prescribed. The medicine does not work as well when you skip doses. Skipping doses also puts you at risk for problems. Contact a health care provider if:  You think you are having a reaction to medicines you have taken.  You have repeated (recurrent) headaches.  You feel dizzy.  You have swelling in your ankles.  You have trouble with your vision. Get help right away if:  You develop a severe headache or confusion.  You have unusual weakness or numbness, or you feel faint.  You have severe pain in your chest or abdomen.  You vomit repeatedly.  You have trouble breathing. Summary  Hypertension is when the force of blood pumping  through your arteries is too strong. If this condition is not controlled, it may put you at risk for serious complications.  Your personal target blood pressure may vary depending on your medical conditions, your age, and other factors. For most people, a normal blood pressure is less than 120/80.  Hypertension is managed by lifestyle changes, medicines, or both. Lifestyle changes include weight loss, eating a healthy, low-sodium diet, exercising more, and limiting alcohol. This information is not intended to replace advice given to you by your health care provider. Make sure you discuss any questions you have with your health care provider. Document Released: 08/21/2012 Document Revised: 03/21/2019 Document Reviewed: 10/25/2016 Elsevier Patient Education  2020 [employer].   Occipital Neuralgia  Occipital neuralgia is a type of headache that causes brief episodes of very bad pain in the back of your head. Pain from occipital neuralgia may spread (radiate) to other parts of your head. These headaches may be caused by irritation of the nerves that leave your spinal cord high up in your neck, just below the base of your skull (occipital nerves). Your occipital nerves transmit sensations from the back of your head, the top of your head, and the areas behind your ears. What are the causes? This condition can occur without any  known cause (primary headache syndrome). In other cases, this condition is caused by pressure on or irritation of one of the two occipital nerves. Pressure and irritation may be due to:  Muscle spasm in the neck.  Neck injury.  Wear and tear of the vertebrae in the neck (osteoarthritis).  Disease of the disks that separate the vertebrae.  Swollen blood vessels that put pressure on the occipital nerves.  Infections.  Tumors.  Diabetes. What are the signs or symptoms? This condition causes brief burning, stabbing, electric, shocking, or shooting pain which can  radiate to the top of the head. It can happen on one side or both sides of the head. It can also cause:  Pain behind the eye.  Pain triggered by neck movement or hair brushing.  Scalp tenderness.  Aching in the back of the head between episodes of very bad pain.  Pain gets worse with exposure to bright lights. How is this diagnosed? There is no test that diagnoses this condition. Your health care provider may diagnose this condition based on a physical exam and your symptoms. Other tests may be done, such as:  Imaging studies of the brain and neck (cervical spine), such as an MRI or CT scan. These look for causes of pinched nerves.  Applying pressure to the nerves in the neck to try to re-create the pain.  Injection of numbing medicine into the occipital nerve areas to see if pain goes away (diagnostic nerve block). How is this treated? Treatment for this condition may begin with simple measures, such as:  Rest.  Massage.  Applying heat or cold on the area.  Over-the-counter pain relievers. If these measures do not work, you may need other treatments, including:  Medicines, such as: ? Prescription-strength anti-inflammatory medicines. ? Muscle relaxants. ? Anti-seizure medicines, which can relieve pain. ? Antidepressants, which can relieve pain. ? Injected medicines, such as medicines that numb the area (local anesthetic) and steroids.  Pulsed radiofrequency ablation. This is when wires are implanted to deliver electrical impulses that block pain signals from the occipital nerve.  Surgery to relieve nerve pressure.  Physical therapy. Follow these instructions at home: Pain management      Avoid any activities that cause pain.  Rest when you have an attack of pain.  Try gentle massage to relieve pain.  Try a different pillow or sleeping position.  If directed, apply heat to the affected area as told by your health care provider. Use the heat source that your  health care provider recommends, such as a moist heat pack or a heating pad. ? Place a towel between your skin and the heat source. ? Leave the heat on for 20-30 minutes. ? Remove the heat if your skin turns bright red. This is especially important if you are unable to feel pain, heat, or cold. You may have a greater risk of getting burned.  If directed, apply ice to the back of the head and neck area as told by your health care provider. ? Put ice in a plastic bag. ? Place a towel between your skin and the bag. ? Leave the ice on for 20 minutes, 2-3 times per day. General instructions  Take over-the-counter and prescription medicines only as told by your health care provider.  Avoid things that make your symptoms worse, such as bright lights.  Try to stay active. Get regular exercise that does not cause pain. Ask your health care provider to suggest safe exercises for you.  Work  with a physical therapist to learn stretching exercises you can do at home.  Practice good posture.  Keep all follow-up visits as told by your health care provider. This is important. Contact a health care provider if:  Your medicine is not working.  You have new or worsening symptoms. Get help right away if:  You have very bad head pain that does not go away.  You have a sudden change in vision, balance, or speech. Summary  Occipital neuralgia is a type of headache that causes brief episodes of very bad pain in the back of your head.  Pain from occipital neuralgia may spread (radiate) to other parts of your head.  Treatment for this condition includes rest, massage, and medicines. This information is not intended to replace advice given to you by your health care provider. Make sure you discuss any questions you have with your health care provider. Document Released: 11/21/2001 Document Revised: 11/13/2017 Document Reviewed: 02/01/2017 Elsevier Patient Education  2020 [employer].

## 2019-10-17 ENCOUNTER — Other Ambulatory Visit: Payer: 59

## 2019-10-17 ENCOUNTER — Encounter: Payer: 59 | Admitting: Obstetrics & Gynecology

## 2019-10-21 ENCOUNTER — Other Ambulatory Visit: Payer: 59

## 2019-10-23 ENCOUNTER — Other Ambulatory Visit: Payer: Self-pay

## 2019-10-23 ENCOUNTER — Telehealth (INDEPENDENT_AMBULATORY_CARE_PROVIDER_SITE_OTHER): Payer: 59 | Admitting: Obstetrics & Gynecology

## 2019-10-23 VITALS — BP 150/88 | HR 80

## 2019-10-23 DIAGNOSIS — O99345 Other mental disorders complicating the puerperium: Secondary | ICD-10-CM | POA: Diagnosis not present

## 2019-10-23 DIAGNOSIS — O165 Unspecified maternal hypertension, complicating the puerperium: Secondary | ICD-10-CM

## 2019-10-23 DIAGNOSIS — F53 Postpartum depression: Secondary | ICD-10-CM | POA: Diagnosis not present

## 2019-10-23 MED ORDER — ESCITALOPRAM OXALATE 20 MG PO TABS: 20.0000 mg | ORAL_TABLET | Freq: Every day | ORAL | 3 refills | Status: DC

## 2019-10-23 NOTE — Progress Notes (Signed)
TELEHEALTH VIRTUAL GYNECOLOGY VISIT ENCOUNTER NOTE  I connected with Regina Foley on 10/23/19 at 11:30 AM EST by telephone at home and verified that I am speaking with the correct person using two identifiers.   I discussed the limitations, risks, security and privacy concerns of performing an evaluation and management service by telephone and the availability of in person appointments. I also discussed with the patient that there may be a patient responsible charge related to this service. The patient expressed understanding and agreed to proceed.   History:  Regina Foley is a 33 y.o. [phone] female being evaluated today for postpartum hypertension and depression. She denies any abnormal vaginal discharge, bleeding, pelvic pain or other concerns.       Past Medical History:  Diagnosis Date   Anemia    Blood transfusion without reported diagnosis    Cardiac arrest ([place])    Cervical cancer ([place])    Degenerative disc disease, thoracic    Hypertension    Intervertebral disk disease    Pregnancy induced hypertension    Past Surgical History:  Procedure Laterality Date   APPENDECTOMY     APPENDECTOMY     CERVICAL CONE BIOPSY     CHOLECYSTECTOMY     LEEP     Ovarian cyst     drained   TUBAL LIGATION Bilateral 10/09/2019   Procedure: POST PARTUM TUBAL LIGATION;  Surgeon: Osborne Oman, MD;  Location: [facility] LD ORS;  Service: Gynecology;  Laterality: Bilateral;   The following portions of the patient's history were reviewed and updated as appropriate: allergies, current medications, past family history, past medical history, past social history, past surgical history and problem list.   Health Maintenance:  Normal pap and negative HRHPV on .  Normal mammogram on n/a.   Review of Systems:  Pertinent items noted in HPI and remainder of comprehensive ROS otherwise negative.  Physical Exam:  Physical exam deferred due to nature of the encounter  Labs and Imaging Results  for orders placed or performed during the hospital encounter of 10/15/19 (from the past 336 hour(s))  Comprehensive metabolic panel   Collection Time: 10/15/19  3:17 PM  Result Value Ref Range   Sodium 142 135 - 145 mmol/L   Potassium 3.9 3.5 - 5.1 mmol/L   Chloride 108 98 - 111 mmol/L   CO2 23 22 - 32 mmol/L   Glucose, Bld 83 70 - 99 mg/dL   BUN 11 6 - 20 mg/dL   Creatinine, Ser 0.76 0.44 - 1.00 mg/dL   Calcium 8.7 (L) 8.9 - 10.3 mg/dL   Total Protein 6.3 (L) 6.5 - 8.1 g/dL   Albumin 3.1 (L) 3.5 - 5.0 g/dL   AST 22 15 - 41 U/L   ALT 26 0 - 44 U/L   Alkaline Phosphatase 75 38 - 126 U/L   Total Bilirubin 0.8 0.3 - 1.2 mg/dL   GFR calc non Af Amer >60 >60 mL/min   GFR calc Af Amer >60 >60 mL/min   Anion gap 11 5 - 15  CBC   Collection Time: 10/15/19  3:17 PM  Result Value Ref Range   WBC 8.4 4.0 - 10.5 K/uL   RBC 4.24 3.87 - 5.11 MIL/uL   Hemoglobin 11.9 (L) 12.0 - 15.0 g/dL   HCT 37.1 36.0 - 46.0 %   MCV 87.5 80.0 - 100.0 fL   MCH 28.1 26.0 - 34.0 pg   MCHC 32.1 30.0 - 36.0 g/dL   RDW 12.6 11.5 -  15.5 %   Platelets 304 150 - 400 K/uL   nRBC 0.0 0.0 - 0.2 %  Magnesium   Collection Time: 10/15/19  3:17 PM  Result Value Ref Range   Magnesium 2.4 1.7 - 2.4 mg/dL  Protein / creatinine ratio, urine   Collection Time: 10/15/19  4:47 PM  Result Value Ref Range   Creatinine, Urine 151.56 mg/dL   Total Protein, Urine 14 mg/dL   Protein Creatinine Ratio 0.09 0.00 - 0.15 mg/mg[Cre]  Results for orders placed or performed during the hospital encounter of 10/07/19 (from the past 336 hour(s))  Surgical pathology   Collection Time: 10/09/19 12:17 PM  Result Value Ref Range   SURGICAL PATHOLOGY      SURGICAL PATHOLOGY CASE: [id number] PATIENT: Regina Foley Surgical Pathology Report     Clinical History: 33w 6d; PP BTL (cm)     FINAL MICROSCOPIC DIAGNOSIS:  A. FALLOPIAN TUBE, LEFT, TUBAL LIGATION: - Complete cross-section of fallopian tube.  B. FALLOPIAN TUBE, RIGHT,  TUBAL LIGATION: - Complete cross-section of fallopian tube.   GROSS DESCRIPTION:  A. Received in formalin is a soft pink tubular segment of tissue measuring 5.8 cm in length x 0.6 cm in diameter. The serosal and cut surfaces are unremarkable. Sections are submitted in one cassette.  B. Received in formalin is a soft pink tubular segment of tissue measuring 4.7 cm in length x 0.6 cm in diameter. The serosal and cut surfaces are unremarkable. Sections are submitted in one cassette.  [facility] 10/10/2019)    Final Diagnosis performed by Enid Cutter, MD.   Electronically signed 10/13/2019 Technical and / or Professional components performed at [facility], [address] [place], [place] [address].  Immunohistochemistry Technical component (if applicable) was performed at [facility]. [address] [place], [place], [place] [address].   IMMUNOHISTOCHEMISTRY DISCLAIMER (if applicable): Some of these immunohistochemical stains may have been developed and the performance characteristics determine by [facility]. Some may not have been cleared or approved by the U.S. Food and Drug Administration. The FDA has determined that such clearance or approval is not necessary. This test is used for clinical purposes. It should not be regarded as investigational or for research. This laboratory is certified under the [place] (CLIA-88) as qualified to perform high complexity clinical laboratory testing.  The controls stained appropriately.   CBC   Collection Time: 10/10/19  6:12 AM  Result Value Ref Range   WBC 10.6 (H) 4.0 - 10.5 K/uL   RBC 3.50 (L) 3.87 - 5.11 MIL/uL   Hemoglobin 9.9 (L) 12.0 - 15.0 g/dL   HCT 30.6 (L) 36.0 - 46.0 %   MCV 87.4 80.0 - 100.0 fL   MCH 28.3 26.0 - 34.0 pg   MCHC 32.4 30.0 - 36.0 g/dL   RDW 13.1 11.5 - 15.5 %   Platelets 218 150 - 400 K/uL   nRBC 0.0 0.0 - 0.2 %    Comprehensive metabolic panel   Collection Time: 10/10/19  6:12 AM  Result Value Ref Range   Sodium 137 135 - 145 mmol/L   Potassium 4.0 3.5 - 5.1 mmol/L   Chloride 104 98 - 111 mmol/L   CO2 23 22 - 32 mmol/L   Glucose, Bld 105 (H) 70 - 99 mg/dL   BUN 11 6 - 20 mg/dL   Creatinine, Ser 0.57 0.44 - 1.00 mg/dL   Calcium 7.6 (L) 8.9 - 10.3 mg/dL   Total  Protein 5.7 (L) 6.5 - 8.1 g/dL   Albumin 2.4 (L) 3.5 - 5.0 g/dL   AST 13 (L) 15 - 41 U/L   ALT 11 0 - 44 U/L   Alkaline Phosphatase 81 38 - 126 U/L   Total Bilirubin 0.3 0.3 - 1.2 mg/dL   GFR calc non Af Amer >60 >60 mL/min   GFR calc Af Amer >60 >60 mL/min   Anion gap 10 5 - 15   US Ob Follow Up  Result Date: 09/26/2019 FOLLOW UP SONOGRAM Lynnlee Odoherty is in the office for a follow up sonogram for EFW,BPP and cord dopplers. She is a 33 y.o. year old G66P1102 with Estimated Date of Delivery: 11/20/19 by early ultrasound now at  [redacted]w[redacted]d weeks gestation. Thus far the pregnancy has been complicated by CHTN,hx of prior preeclampsia. GESTATION: SINGLETON PRESENTATION: cephalic FETAL ACTIVITY:          Heart rate         132          The fetus is active. AMNIOTIC FLUID: The amniotic fluid volume is  normal, 14 cm. PLACENTA LOCALIZATION:  posterior GRADE 3 CERVIX: Limited view GESTATIONAL AGE AND  BIOMETRICS: Gestational criteria: Estimated Date of Delivery: 11/20/19 by early ultrasound now at [redacted]w[redacted]d Previous Scans:4          BIPARIETAL DIAMETER           33+3 cm         80.62 weeks HEAD CIRCUMFERENCE           30.87 cm         34+3 weeks ABDOMINAL CIRCUMFERENCE           31.27 cm         35+1 weeks    99% FEMUR LENGTH           6.4 cm         33 weeks                                                       AVERAGE EGA(BY THIS SCAN):  34 weeks                                                 ESTIMATED FETAL WEIGHT:       2417  grams, 95 % BIOPHYSICAL PROFILE:                                                                                                       COMMENTS GROSS BODY MOVEMENT                 2  TONE                2  RESPIRATIONS  2  AMNIOTIC FLUID                2                                                          SCORE:  8/8 (Note: NST was not performed as part of this antepartum testing) DOPPLER FLOW STUDIES: UMBILICAL ARTERY RI RATIOS:  .66,.63,.69=73% ANATOMICAL SURVEY                                                                            COMMENTS CEREBRAL VENTRICLES yes normal  CHOROID PLEXUS yes normal  CEREBELLUM yes normal  CISTERNA MAGNA yes normal                  FACIAL PROFILE yes normal  4 CHAMBERED HEART yes normal  OUTFLOW TRACTS yes normal  DIAPHRAGM yes normal  STOMACH yes normal  RENAL REGION yes normal  BLADDER yes normal      3 VESSEL CORD yes normal              GENITALIA yes normal female     SUSPECTED ABNORMALITIES:  EFW 95% QUALITY OF SCAN: satisfactory TECHNICIAN COMMENTS: [place] [id number] wks,cephalic,posterior placenta gr 3,BPP 8/8,RI .66,.63,.69=73%,AFI 14 cm,efw 2417 g 95%,AC 99%,limited measurement of head because of fetal position,fhr 132 bpm A copy of this report including all images has been saved and backed up to a second source for retrieval if needed. All measures and details of the anatomical scan, placentation, fluid volume and pelvic anatomy are contained in that report. Amber Heide Guile 09/26/2019 12:41 PM Clinical Impression and recommendations: I have reviewed the sonogram results above, combined with the patient's current clinical course, below are my impressions and any appropriate recommendations for management based on the sonographic findings. 1.  [id number] Estimated Date of Delivery: 11/20/19 by serial sonographic evaluations 2.  Fetal sonographic surveillance findings: a). Normal fluid volume b). Normal antepartum fetal assessment with BPP 8/8 c). Normal fetal Doppler ratios with consistent diastolic flow 123456 d). Suspected fetal macrosomia EFW 95% 3.  Normal general sonographic findings Recommend  continued prenatal evaluations and care based on this sonogram and as clinically indicated from the patient's clinical course. Florian Buff 09/26/2019 5:51 PM   US Fetal Bpp W/o Non Stress  Result Date: 10/05/2019 FOLLOW UP SONOGRAM Mariam Santillano is in the office for a follow up sonogram for BPP and cord dopplers. She is a 33 y.o. year old G36P1102 with Estimated Date of Delivery: 11/20/19 by early ultrasound now at  [redacted]w[redacted]d weeks gestation. Thus far the pregnancy has been complicated by \\CHTN ,HX of prior preeeclampsia. GESTATION:SINGLETON PRESENTATION: cephalic FETAL ACTIVITY:          Heart rate         146          The fetus is active. AMNIOTIC FLUID: The amniotic fluid volume is  normal, 16.5 cm. PLACENTA LOCALIZATION:  posterior GRADE 3 CERVIX: Limited view GESTATIONAL AGE AND  BIOMETRICS: Gestational criteria: Estimated  Date of Delivery: 11/20/19 by early ultrasound now at [redacted]w[redacted]d Previous Scans:5 BIOPHYSICAL PROFILE:                                                                                                      COMMENTS GROSS BODY MOVEMENT                 2  TONE                2  RESPIRATIONS                2  AMNIOTIC FLUID                2                                                          SCORE:  8/8 (Note: NST was not performed as part of this antepartum testing) DOPPLER FLOW STUDIES: UMBILICAL ARTERY RI RATIOS:    .57,.61,.58,.53=37%                                                ANATOMICAL SURVEY                                                                            COMMENTS CEREBRAL VENTRICLES yes normal  CHOROID PLEXUS yes normal  CEREBELLUM yes normal  CISTERNA MAGNA yes normal              NOSE/LIP yes normal  FACIAL PROFILE yes normal  4 CHAMBERED HEART yes normal  OUTFLOW TRACTS yes normal  DIAPHRAGM yes normal  STOMACH yes normal  RENAL REGION yes normal  BLADDER yes normal      3 VESSEL CORD yes normal              GENITALIA yes normal female     SUSPECTED ABNORMALITIES:  no QUALITY  OF SCAN: satisfactory TECHNICIAN COMMENTS: [place] 33 wks,cephalic,BPP [id number] BPM,post placenta gr 3,afi 16.5 cm,RI .57,.61,.58,.53=37% A copy of this report including all images has been saved and backed up to a second source for retrieval if needed. All measures and details of the anatomical scan, placentation, fluid volume and pelvic anatomy are contained in that report. Amber Heide Guile 10/02/2019 11:06 AM Clinical Impression and recommendations: I have reviewed the sonogram results above, combined with the patient's current clinical course, below are my impressions and any appropriate recommendations for management based on the sonographic findings. 1.  [id number] Estimated Date of Delivery: 11/20/19 by  early ultrasound, midtrimester ultrasound and confirmed by today's sonographic dating 2.  Normal fetal sonographic findings, specifically normal detailed anatomical evaluation,      no abnormalities noted 3.  Normal general sonographic findings, with specifically reassuring fetal assessment with good BPP 8/8, fluid and excellent Doppler flow Recommend ongoing surveillance  prenatal care, continued BPP per [facility] protocol, based on this sonogram or as clinically indicated [name] 10/05/2019 8:07 AM   US Fetal Bpp W/o Non Stress  Result Date: 09/26/2019 FOLLOW UP SONOGRAM Renesme Kliebert is in the office for a follow up sonogram for EFW,BPP and cord dopplers. She is a 33 y.o. year old G74P1102 with Estimated Date of Delivery: 11/20/19 by early ultrasound now at  [redacted]w[redacted]d weeks gestation. Thus far the pregnancy has been complicated by CHTN,hx of prior preeclampsia. GESTATION: SINGLETON PRESENTATION: cephalic FETAL ACTIVITY:          Heart rate         132          The fetus is active. AMNIOTIC FLUID: The amniotic fluid volume is  normal, 14 cm. PLACENTA LOCALIZATION:  posterior GRADE 3 CERVIX: Limited view GESTATIONAL AGE AND  BIOMETRICS: Gestational criteria: Estimated Date of Delivery: 11/20/19 by early ultrasound  now at [redacted]w[redacted]d Previous Scans:4          BIPARIETAL DIAMETER           33+3 cm         80.62 weeks HEAD CIRCUMFERENCE           30.87 cm         34+3 weeks ABDOMINAL CIRCUMFERENCE           31.27 cm         35+1 weeks    99% FEMUR LENGTH           6.4 cm         33 weeks                                                       AVERAGE EGA(BY THIS SCAN):  34 weeks                                                 ESTIMATED FETAL WEIGHT:       2417  grams, 95 % BIOPHYSICAL PROFILE:                                                                                                      COMMENTS GROSS BODY MOVEMENT                 2  TONE  2  RESPIRATIONS                2  AMNIOTIC FLUID                2                                                          SCORE:  8/8 (Note: NST was not performed as part of this antepartum testing) DOPPLER FLOW STUDIES: UMBILICAL ARTERY RI RATIOS:  .66,.63,.69=73% ANATOMICAL SURVEY                                                                            COMMENTS CEREBRAL VENTRICLES yes normal  CHOROID PLEXUS yes normal  CEREBELLUM yes normal  CISTERNA MAGNA yes normal                  FACIAL PROFILE yes normal  4 CHAMBERED HEART yes normal  OUTFLOW TRACTS yes normal  DIAPHRAGM yes normal  STOMACH yes normal  RENAL REGION yes normal  BLADDER yes normal      3 VESSEL CORD yes normal              GENITALIA yes normal female     SUSPECTED ABNORMALITIES:  EFW 95% QUALITY OF SCAN: satisfactory TECHNICIAN COMMENTS: [place] [id number] wks,cephalic,posterior placenta gr 3,BPP 8/8,RI .66,.63,.69=73%,AFI 14 cm,efw 2417 g 95%,AC 99%,limited measurement of head because of fetal position,fhr 132 bpm A copy of this report including all images has been saved and backed up to a second source for retrieval if needed. All measures and details of the anatomical scan, placentation, fluid volume and pelvic anatomy are contained in that report. Amber Heide Guile 09/26/2019 12:41 PM Clinical Impression and  recommendations: I have reviewed the sonogram results above, combined with the patient's current clinical course, below are my impressions and any appropriate recommendations for management based on the sonographic findings. 1.  [id number] Estimated Date of Delivery: 11/20/19 by serial sonographic evaluations 2.  Fetal sonographic surveillance findings: a). Normal fluid volume b). Normal antepartum fetal assessment with BPP 8/8 c). Normal fetal Doppler ratios with consistent diastolic flow 123456 d). Suspected fetal macrosomia EFW 95% 3.  Normal general sonographic findings Recommend continued prenatal evaluations and care based on this sonogram and as clinically indicated from the patient's clinical course. Florian Buff 09/26/2019 5:51 PM   [place] Ua Cord Doppler  Result Date: 10/05/2019 FOLLOW UP SONOGRAM Madissyn Beckers is in the office for a follow up sonogram for BPP and cord dopplers. She is a 33 y.o. year old G4P1102 with Estimated Date of Delivery: 11/20/19 by early ultrasound now at  [redacted]w[redacted]d weeks gestation. Thus far the pregnancy has been complicated by \\CHTN ,HX of prior preeeclampsia. GESTATION:SINGLETON PRESENTATION: cephalic FETAL ACTIVITY:          Heart rate         146          The fetus is active. AMNIOTIC FLUID: The amniotic fluid volume is  normal, 16.5 cm. PLACENTA  LOCALIZATION:  posterior GRADE 3 CERVIX: Limited view GESTATIONAL AGE AND  BIOMETRICS: Gestational criteria: Estimated Date of Delivery: 11/20/19 by early ultrasound now at [redacted]w[redacted]d Previous Scans:5 BIOPHYSICAL PROFILE:                                                                                                      COMMENTS GROSS BODY MOVEMENT                 2  TONE                2  RESPIRATIONS                2  AMNIOTIC FLUID                2                                                          SCORE:  8/8 (Note: NST was not performed as part of this antepartum testing) DOPPLER FLOW STUDIES: UMBILICAL ARTERY RI RATIOS:     .57,.61,.58,.53=37%                                                ANATOMICAL SURVEY                                                                            COMMENTS CEREBRAL VENTRICLES yes normal  CHOROID PLEXUS yes normal  CEREBELLUM yes normal  CISTERNA MAGNA yes normal              NOSE/LIP yes normal  FACIAL PROFILE yes normal  4 CHAMBERED HEART yes normal  OUTFLOW TRACTS yes normal  DIAPHRAGM yes normal  STOMACH yes normal  RENAL REGION yes normal  BLADDER yes normal      3 VESSEL CORD yes normal              GENITALIA yes normal female     SUSPECTED ABNORMALITIES:  no QUALITY OF SCAN: satisfactory TECHNICIAN COMMENTS: [place] 33 wks,cephalic,BPP [id number] [id number] BPM,post placenta gr 3,afi 16.5 cm,RI .57,.61,.58,.53=37% A copy of this report including all images has been saved and backed up to a second source for retrieval if needed. All measures and details of the anatomical scan, placentation, fluid volume and pelvic anatomy are contained in that report. Amber Heide Guile 10/02/2019 11:06 AM Clinical Impression and recommendations: I have reviewed the sonogram results above, combined with the patient's current clinical course,  below are my impressions and any appropriate recommendations for management based on the sonographic findings. 1.  [id number] Estimated Date of Delivery: 11/20/19 by  early ultrasound, midtrimester ultrasound and confirmed by today's sonographic dating 2.  Normal fetal sonographic findings, specifically normal detailed anatomical evaluation,      no abnormalities noted 3.  Normal general sonographic findings, with specifically reassuring fetal assessment with good BPP 8/8, fluid and excellent Doppler flow Recommend ongoing surveillance  prenatal care, continued BPP per [facility] protocol, based on this sonogram or as clinically indicated [name] 10/05/2019 8:07 AM   [place] Ua Cord Doppler  Result Date: 09/26/2019 FOLLOW UP SONOGRAM Rosenda Milazzo is in the office for a follow up sonogram for EFW,BPP  and cord dopplers. She is a 33 y.o. year old G73P1102 with Estimated Date of Delivery: 11/20/19 by early ultrasound now at  [redacted]w[redacted]d weeks gestation. Thus far the pregnancy has been complicated by CHTN,hx of prior preeclampsia. GESTATION: SINGLETON PRESENTATION: cephalic FETAL ACTIVITY:          Heart rate         132          The fetus is active. AMNIOTIC FLUID: The amniotic fluid volume is  normal, 14 cm. PLACENTA LOCALIZATION:  posterior GRADE 3 CERVIX: Limited view GESTATIONAL AGE AND  BIOMETRICS: Gestational criteria: Estimated Date of Delivery: 11/20/19 by early ultrasound now at [redacted]w[redacted]d Previous Scans:4          BIPARIETAL DIAMETER           33+3 cm         80.62 weeks HEAD CIRCUMFERENCE           30.87 cm         34+3 weeks ABDOMINAL CIRCUMFERENCE           31.27 cm         35+1 weeks    99% FEMUR LENGTH           6.4 cm         33 weeks                                                       AVERAGE EGA(BY THIS SCAN):  34 weeks                                                 ESTIMATED FETAL WEIGHT:       2417  grams, 95 % BIOPHYSICAL PROFILE:                                                                                                      COMMENTS GROSS BODY MOVEMENT                 2  TONE                2  RESPIRATIONS                2  AMNIOTIC FLUID                2                                                          SCORE:  8/8 (Note: NST was not performed as part of this antepartum testing) DOPPLER FLOW STUDIES: UMBILICAL ARTERY RI RATIOS:  .66,.63,.69=73% ANATOMICAL SURVEY                                                                            COMMENTS CEREBRAL VENTRICLES yes normal  CHOROID PLEXUS yes normal  CEREBELLUM yes normal  CISTERNA MAGNA yes normal                  FACIAL PROFILE yes normal  4 CHAMBERED HEART yes normal  OUTFLOW TRACTS yes normal  DIAPHRAGM yes normal  STOMACH yes normal  RENAL REGION yes normal  BLADDER yes normal      3 VESSEL CORD yes normal              GENITALIA yes  normal female     SUSPECTED ABNORMALITIES:  EFW 95% QUALITY OF SCAN: satisfactory TECHNICIAN COMMENTS: [place] [id number] wks,cephalic,posterior placenta gr 3,BPP 8/8,RI .66,.63,.69=73%,AFI 14 cm,efw 2417 g 95%,AC 99%,limited measurement of head because of fetal position,fhr 132 bpm A copy of this report including all images has been saved and backed up to a second source for retrieval if needed. All measures and details of the anatomical scan, placentation, fluid volume and pelvic anatomy are contained in that report. Amber Heide Guile 09/26/2019 12:41 PM Clinical Impression and recommendations: I have reviewed the sonogram results above, combined with the patient's current clinical course, below are my impressions and any appropriate recommendations for management based on the sonographic findings. 1.  [id number] Estimated Date of Delivery: 11/20/19 by serial sonographic evaluations 2.  Fetal sonographic surveillance findings: a). Normal fluid volume b). Normal antepartum fetal assessment with BPP 8/8 c). Normal fetal Doppler ratios with consistent diastolic flow [id number] d). Suspected fetal macrosomia EFW 95% 3.  Normal general sonographic findings Recommend continued prenatal evaluations and care based on this sonogram and as clinically indicated from the patient's clinical course. Mertie Clause Rashied Corallo 09/26/2019 5:51 PM   [place] Mfm Fetal Bpp Wo Non Stress  Result Date: 10/06/2019 ----------------------------------------------------------------------  OBSTETRICS REPORT                       (Signed Final 10/06/2019 12:55 pm) ---------------------------------------------------------------------- Patient Info  ID #:       [id number]                          D.O.B.:  1986-01-05 (33 yrs)  Name:       Rhiannan [place]  Visit Date: 10/05/2019 11:11 pm ---------------------------------------------------------------------- Performed By  Performed By:     Dorena Dew     Ref. Address:      [address], [place] [address]                                                              [place], [place]  Attending:        Sander Nephew      Secondary Phy.:    MAU Nursing-                    MD                                                              MAU/Triage  Referred By:      Dan Europe               Location[facility]          [facility]                    [place] ---------------------------------------------------------------------- Orders   #  Description                          Code         Ordered By   1  [place] MFM FETAL BPP WO NON              [id number]     HAILEY SPARACINO      STRESS  ----------------------------------------------------------------------   #  Order #                    Accession #                 Episode #   1  [id number]                  [id number]  [id number]  ---------------------------------------------------------------------- Indications   Decreased fetal movements, third trimester,    O36.8130   unspecified   [redacted] weeks gestation of pregnancy                Z3A.33  ---------------------------------------------------------------------- Fetal Evaluation  Num Of Fetuses:          1  Fetal Heart Rate(bpm):   133  Cardiac Activity:        Observed  Presentation:            Cephalic  Amniotic Fluid  AFI FV:      Within normal limits  AFI Sum(cm)     %Tile       Largest Pocket(cm)  20.9            79          7.1  RUQ(cm)       RLQ(cm)       LUQ(cm)        LLQ(cm)  6.05          4.25          7.1            3.5 ---------------------------------------------------------------------- Biophysical Evaluation  Amniotic F.V:   Within normal limits       F. Tone:         Observed  F. Movement:    Observed                   Score:           6/8  F. Breathing:   Not Observed  ---------------------------------------------------------------------- Gestational Age  Clinical EDD:  33w 3d                                        EDD:   11/20/19  Best:          33w 3d     Det. By:  Clinical EDD             EDD:   11/20/19 ---------------------------------------------------------------------- Impression  Biophysical profile 6/8  Decreased fetal movement per MAU ---------------------------------------------------------------------- Recommendations  Follow up per inpatient team. ----------------------------------------------------------------------               Sander Nephew, MD Electronically Signed Final Report   10/06/2019 12:55 pm ----------------------------------------------------------------------      Meds ordered this encounter  Medications   escitalopram (LEXAPRO) 20 MG tablet    Sig: Take 1 tablet (20 mg total) by mouth daily.    Dispense:  30 tablet    Refill:  3    No orders of the defined types were placed in this encounter.   Assessment and Plan:     1. Postpartum depression, postpartum condition Meds ordered this encounter  Medications   escitalopram (LEXAPRO) 20 MG tablet    Sig: Take 1 tablet (20 mg total) by mouth daily.    Dispense:  30 tablet    Refill:  3     2. Postpartum hypertension Continue on bystolic and enlapril as ordered       I discussed the assessment and treatment plan with the patient. The patient was provided an opportunity to ask questions and all were answered. The patient agreed with the plan and demonstrated an understanding of the instructions.   The patient was advised to call back or seek an in-person evaluation/go to the ED if the  symptoms worsen or if the condition fails to improve as anticipated.  I provided 11 minutes of non-face-to-face time during this encounter.   Florian Buff, [place] for [facility]

## 2019-10-24 ENCOUNTER — Encounter: Payer: 59 | Admitting: Obstetrics & Gynecology

## 2019-10-24 ENCOUNTER — Other Ambulatory Visit: Payer: 59

## 2019-10-28 ENCOUNTER — Other Ambulatory Visit: Payer: 59

## 2019-10-31 ENCOUNTER — Other Ambulatory Visit: Payer: 59

## 2019-10-31 ENCOUNTER — Encounter: Payer: 59 | Admitting: Obstetrics & Gynecology

## 2019-11-04 ENCOUNTER — Other Ambulatory Visit: Payer: 59 | Admitting: Obstetrics and Gynecology

## 2019-11-11 ENCOUNTER — Other Ambulatory Visit: Payer: 59

## 2019-11-13 ENCOUNTER — Encounter: Payer: Self-pay | Admitting: Advanced Practice Midwife

## 2019-11-13 ENCOUNTER — Ambulatory Visit (INDEPENDENT_AMBULATORY_CARE_PROVIDER_SITE_OTHER): Payer: 59 | Admitting: Advanced Practice Midwife

## 2019-11-13 ENCOUNTER — Other Ambulatory Visit: Payer: Self-pay

## 2019-11-13 DIAGNOSIS — Z9079 Acquired absence of other genital organ(s): Secondary | ICD-10-CM

## 2019-11-13 DIAGNOSIS — Z1389 Encounter for screening for other disorder: Secondary | ICD-10-CM

## 2019-11-13 NOTE — Progress Notes (Signed)
Regina Foley is a 33 y.o. who presents for a postpartum visit. She is 5 weeks postpartum following a spontaneous vaginal delivery. I have fully reviewed the prenatal and intrapartum course. The delivery was at 33+ gestational weeks d/t spontaneous labor.  She has CHTN, was on bystsolic 10mg  before pregnancy. .  Anesthesia: epidural. Postpartum course has been complicated by PP PreE, readmitted and given MgSO4. Vasotec 10mg  was added, and she last took her meds today.  . Baby's course has been uneventful. Baby is feeding by breast. Bleeding: no bleeding. Bowel function is normal. Bladder function is normal. Patient is not sexually active. Contraception method is tubal ligation. (salpingectomy) Still  Sore in abdominal muscles. Sore when lifts car seat in lower abodomen, Postpartum depression screening: was started on Lexapro 11/12. Mild supply dwindelled, so stopped.  Supply back to normal. Feels like she's doing ok,. .   Current Outpatient Medications:  .  Acetaminophen (TYLENOL PO), Take by mouth as needed., Disp: , Rfl:  .  enalapril (VASOTEC) 10 MG tablet, Take 0.5 tablets (5 mg total) by mouth 2 (two) times daily., Disp: 30 tablet, Rfl: 11 .  nebivolol (BYSTOLIC) 10 MG tablet, Take 1 tablet (10 mg total) by mouth daily., Disp: 30 tablet, Rfl: 2 .  Prenatal Vit-Fe Fumarate-FA (MULTIVITAMIN-PRENATAL) 27-0.8 MG TABS tablet, Take 1 tablet by mouth daily. , Disp: , Rfl:   Review of Systems   Constitutional: Negative for fever and chills Eyes: Negative for visual disturbances Respiratory: Negative for shortness of breath, dyspnea Cardiovascular: Negative for chest pain or palpitations  Gastrointestinal: Negative for vomiting, diarrhea and constipation Genitourinary: Negative for dysuria and urgency Musculoskeletal: Negative for back pain, joint pain, myalgias  Neurological: Negative for dizziness and headaches    Objective:     Vitals:   11/13/19 1432  BP: (!) 142/94  Pulse: 87   General:   alert, cooperative and no distress   Breasts:  negative  Lungs: Normal respiratory effort  Heart:  regular rate and rhythm  Abdomen: Soft, nontender   Vulva:  normal  Vagina: normal vagina  Cervix:  closed  Corpus: Well involuted     Rectal Exam: no hemorrhoids        Assessment:    normal postpartum exam. CHTN: continue both meds.  Take BP ever few days, may drop vasotec once BP <120/80.  Let us know how it's going Depression: declined referral at this time.  Plan:   1. Contraception; BSO at delivery 2. Follow up in:  .

## 2019-11-14 ENCOUNTER — Other Ambulatory Visit: Payer: 59

## 2019-11-14 ENCOUNTER — Encounter: Payer: 59 | Admitting: Obstetrics & Gynecology

## 2019-11-18 ENCOUNTER — Encounter: Payer: Self-pay | Admitting: Advanced Practice Midwife

## 2019-11-18 ENCOUNTER — Other Ambulatory Visit: Payer: Self-pay | Admitting: Women's Health

## 2019-11-18 MED ORDER — VALACYCLOVIR HCL 1 G PO TABS: 2000.0000 mg | ORAL_TABLET | Freq: Two times a day (BID) | ORAL | 6 refills | Status: DC

## 2019-11-19 ENCOUNTER — Encounter: Payer: Self-pay | Admitting: Advanced Practice Midwife

## 2019-11-20 ENCOUNTER — Inpatient Hospital Stay (HOSPITAL_COMMUNITY): Admission: AD | Admit: 2019-11-20 | Payer: 59 | Source: Home / Self Care

## 2019-12-17 ENCOUNTER — Telehealth: Payer: Self-pay | Admitting: *Deleted

## 2019-12-17 NOTE — Telephone Encounter (Signed)
Pt called following up on papers from [place]. Please call. Thanks!!

## 2019-12-22 ENCOUNTER — Encounter: Payer: Self-pay | Admitting: Advanced Practice Midwife

## 2020-01-12 ENCOUNTER — Telehealth: Payer: Self-pay | Admitting: Obstetrics & Gynecology

## 2020-01-12 MED ORDER — SERTRALINE HCL 50 MG PO TABS: 50.0000 mg | ORAL_TABLET | Freq: Every day | ORAL | 3 refills | Status: DC

## 2020-02-04 ENCOUNTER — Other Ambulatory Visit: Payer: Self-pay | Admitting: Obstetrics & Gynecology

## 2020-02-12 ENCOUNTER — Encounter: Payer: Self-pay | Admitting: *Deleted

## 2020-04-12 ENCOUNTER — Other Ambulatory Visit: Payer: Self-pay | Admitting: Obstetrics & Gynecology

## 2020-04-12 MED ORDER — AMOXICILLIN 500 MG PO CAPS: 500.0000 mg | ORAL_CAPSULE | Freq: Three times a day (TID) | ORAL | 0 refills | Status: DC

## 2020-04-27 ENCOUNTER — Other Ambulatory Visit: Payer: Self-pay | Admitting: Obstetrics & Gynecology

## 2020-04-27 MED ORDER — MEGESTROL ACETATE 40 MG PO TABS: ORAL_TABLET | ORAL | 0 refills | Status: DC

## 2020-05-11 ENCOUNTER — Other Ambulatory Visit: Payer: Self-pay | Admitting: Obstetrics & Gynecology

## 2020-06-23 ENCOUNTER — Telehealth: Payer: Self-pay | Admitting: Obstetrics & Gynecology
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
## 2020-06-24 ENCOUNTER — Encounter: Payer: Self-pay | Admitting: *Deleted

## 2020-10-15 ENCOUNTER — Other Ambulatory Visit (HOSPITAL_COMMUNITY)
Admission: RE | Admit: 2020-10-15 | Discharge: 2020-10-15 | Disposition: A | Discharge status: Home / Self Care | Payer: 59 | Source: Ambulatory Visit | Attending: Obstetrics & Gynecology | Admitting: Obstetrics & Gynecology

## 2020-10-15 ENCOUNTER — Ambulatory Visit (INDEPENDENT_AMBULATORY_CARE_PROVIDER_SITE_OTHER): Payer: 59 | Admitting: Women's Health

## 2020-10-15 ENCOUNTER — Encounter: Payer: Self-pay | Admitting: Women's Health

## 2020-10-15 VITALS — BP 124/85 | HR 72 | Ht 66.0 in | Wt 202.2 lb

## 2020-10-15 DIAGNOSIS — Z0000 Encounter for general adult medical examination without abnormal findings: Secondary | ICD-10-CM

## 2020-10-15 DIAGNOSIS — E7800 Pure hypercholesterolemia, unspecified: Secondary | ICD-10-CM | POA: Diagnosis not present

## 2020-10-15 DIAGNOSIS — L918 Other hypertrophic disorders of the skin: Secondary | ICD-10-CM

## 2020-10-15 DIAGNOSIS — I10 Essential (primary) hypertension: Secondary | ICD-10-CM | POA: Diagnosis not present

## 2020-10-15 DIAGNOSIS — Z124 Encounter for screening for malignant neoplasm of cervix: Secondary | ICD-10-CM | POA: Diagnosis present

## 2020-10-15 DIAGNOSIS — Z79899 Other long term (current) drug therapy: Secondary | ICD-10-CM

## 2020-10-15 NOTE — Progress Notes (Signed)
WELL-WOMAN EXAMINATION Patient name: Regina Foley MRN [id number]  Date of birth: Dec 08, 1986 Chief Complaint:   Gynecologic Exam  History of Present Illness:   Regina Foley is a 34 y.o. [phone] Caucasian female being seen today for a routine well-woman exam.  Current complaints: trouble losing weight despite diet changes/exercise, PCP wants labs- brought list, skin tags pantyline- wants removed if possible- get irritated.   Depression screen [facility] 2/9 10/15/2020 05/20/2019 02/20/2018 02/04/2018 01/21/2018  Decreased Interest 0 0 0 0 0  Down, Depressed, Hopeless 0 0 0 0 0  PHQ - 2 Score 0 0 0 0 0  Altered sleeping 0 0 0 0 0  Tired, decreased energy 0 0 0 0 0  Change in appetite 0 0 0 0 0  Feeling bad or failure about yourself  0 0 0 0 0  Trouble concentrating 0 0 0 0 0  Moving slowly or fidgety/restless 0 0 0 0 0  Suicidal thoughts 0 0 0 0 0  PHQ-9 Score 0 0 0 0 0     PCP: [place], Dr. Harmon Pier      does desire labs, has been fasting Patient's last menstrual period was 09/29/2020 (exact date). The current method of family planning is bilateral salpingectomy.  Last pap 09/14/17. Results were: normal. H/O abnormal pap: yes many years ago Last mammogram: never. Results were: N/A. Family h/o breast cancer: no Last colonoscopy: never. Results were: N/A. Family h/o colorectal cancer: no Review of Systems:   Pertinent items are noted in HPI Denies any headaches, blurred vision, fatigue, shortness of breath, chest pain, abdominal pain, abnormal vaginal discharge/itching/odor/irritation, problems with periods, bowel movements, urination, or intercourse unless otherwise stated above. Pertinent History Reviewed:  Reviewed past medical,surgical, social and family history.  Reviewed problem list, medications and allergies. Physical Assessment:   Vitals:   10/15/20 1040  BP: 124/85  Pulse: 72  Weight: 202 lb 3.2 oz (91.7 kg)  Height: 5\' 6"  (1.676 m)  Body mass index is 32.64 kg/m.         Physical Examination:   General appearance - well appearing, and in no distress  Mental status - alert, oriented to person, place, and time  Psych:  She has a normal mood and affect  Skin - warm and dry, normal color, no suspicious lesions noted  Chest - effort normal, all lung fields clear to auscultation bilaterally  Heart - normal rate and regular rhythm  Neck:  midline trachea, no thyromegaly or nodules  Breasts - breasts appear normal, no suspicious masses, no skin or nipple changes or  axillary nodes  Abdomen - soft, nontender, nondistended, no masses or organomegaly  Pelvic - VULVA: normal appearing vulva with no tendernes. 4 skin tags (3 on Rt groin/buttocks, 1 on Lt) VAGINA: normal appearing vagina with normal color and discharge, no lesions  CERVIX: normal appearing cervix without discharge or lesions, no CMT  Thin prep pap is done w/ HR HPV cotesting  UTERUS: uterus is felt to be normal size, shape, consistency and nontender   ADNEXA: No adnexal masses or tenderness noted.  Extremities:  No swelling or varicosities noted  Chaperone: [employer]    Skin tag removal: signed consent in chart Time out Each skin tag clamped at base, then excised w/ 11 blade. Samples in different containers to path. No blood loss. Pt tolerated procedure well  No results found for this or any previous visit (from the past 24 hour(s)).  Assessment & Plan:  1) Well-Woman Exam  2) Skin tag removal x 4> groin/buttocks, sent to path  3) Chronic hypertension  4) Trouble losing weight> check labs, if normal, make appt w/ Jenn  Labs/procedures today: pap, labs, skin tag removal  Mammogram @32yo  or sooner if problems Colonoscopy @32yo  or sooner if problems  Orders Placed This Encounter  Procedures  . CBC  . Comprehensive metabolic panel  . TSH  . T4  . Urinalysis  . Homocysteine  . Fibrinogen  . Cardio IQ (R) Advanced Lipid Panel  . Cardio IQ Insulin Resistance Panel with Score  . Pro b  natriuretic peptide (BNP)    Meds: No orders of the defined types were placed in this encounter.   Follow-up: Return in about 1 year (around 10/15/2021) for Physical.  [place], WHNP-BC 10/15/2020 11:42 AM

## 2020-10-19 LAB — CYTOLOGY - PAP
Comment: NEGATIVE
Diagnosis: NEGATIVE
High risk HPV: NEGATIVE

## 2020-10-29 LAB — CARDIO IQ(R) ADVANCED LIPID PANEL
Apolipoprotein B: 115 mg/dL — ABNORMAL HIGH (ref <90)
Cholesterol: 198 mg/dL (ref <200)
HDL: 34 mg/dL — ABNORMAL LOW (ref >49)
LDL Cholesterol (Calc): 114 mg/dL (calc) — ABNORMAL HIGH (ref <100)
LDL Large: 7250 nmol/L (ref >6729)
LDL Medium: 243 nmol/L — ABNORMAL HIGH (ref <215)
LDL Particle Number: 2022 nmol/L — ABNORMAL HIGH (ref <1138)
LDL Peak Size: 206.8 Angstrom — ABNORMAL LOW (ref >222.9)
LDL Small: 399 nmol/L — ABNORMAL HIGH (ref <142)
Lipoprotein (a): <10 nmol/L (ref <75)
Non-HDL Cholesterol (Calc): 164 mg/dL (calc) — ABNORMAL HIGH (ref <130)
Total CHOL/HDL Ratio: 5.8 calc — ABNORMAL HIGH (ref <3.6)
Triglycerides: 347 mg/dL — ABNORMAL HIGH (ref <150)

## 2020-10-29 LAB — CBC
HCT: 41.9 % (ref 35.0–45.0)
Hemoglobin: 14.4 g/dL (ref 11.7–15.5)
MCH: 30.3 pg (ref 27.0–33.0)
MCHC: 34.4 g/dL (ref 32.0–36.0)
MCV: 88 fL (ref 80.0–100.0)
MPV: 10 fL (ref 7.5–12.5)
Platelets: 236 10*3/uL (ref 140–400)
RBC: 4.76 10*6/uL (ref 3.80–5.10)
RDW: 12.7 % (ref 11.0–15.0)
WBC: 9.6 10*3/uL (ref 3.8–10.8)

## 2020-10-29 LAB — URINALYSIS, COMPLETE
Bacteria, UA: NONE SEEN /HPF
Bilirubin Urine: NEGATIVE
Glucose, UA: NEGATIVE
Hgb urine dipstick: NEGATIVE
Hyaline Cast: NONE SEEN /LPF
Ketones, ur: NEGATIVE
Leukocytes,Ua: NEGATIVE
Nitrite: NEGATIVE
Protein, ur: NEGATIVE
RBC / HPF: NONE SEEN /HPF (ref 0–2)
Specific Gravity, Urine: 1.017 (ref 1.001–1.03)
Squamous Epithelial / HPF: NONE SEEN /HPF (ref <=5)
WBC, UA: NONE SEEN /HPF (ref 0–5)
pH: 5.5 (ref 5.0–8.0)

## 2020-10-29 LAB — COMPLETE METABOLIC PANEL WITH GFR
AG Ratio: 1.6 (calc) (ref 1.0–2.5)
ALT: 33 U/L — ABNORMAL HIGH (ref 6–29)
AST: 31 U/L — ABNORMAL HIGH (ref 10–30)
Albumin: 5 g/dL (ref 3.6–5.1)
Alkaline phosphatase (APISO): 72 U/L (ref 31–125)
BUN: 14 mg/dL (ref 7–25)
CO2: 27 mmol/L (ref 20–32)
Calcium: 10.1 mg/dL (ref 8.6–10.2)
Chloride: 101 mmol/L (ref 98–110)
Creat: 0.5 mg/dL (ref 0.50–1.10)
GFR, Est African American: 148 mL/min/{1.73_m2} (ref >=60)
GFR, Est Non African American: 128 mL/min/{1.73_m2} (ref >=60)
Globulin: 3.1 g/dL (calc) (ref 1.9–3.7)
Glucose, Bld: 77 mg/dL (ref 65–99)
Potassium: 3.9 mmol/L (ref 3.5–5.3)
Sodium: 139 mmol/L (ref 135–146)
Total Bilirubin: 0.6 mg/dL (ref 0.2–1.2)
Total Protein: 8.1 g/dL (ref 6.1–8.1)

## 2020-10-29 LAB — CARDIO IQ INSULIN RESISTANCE PANEL WITH SCORE
C-PEPTIDE, LC/MS/MS: 1.91 ng/mL (ref 0.68–2.16)
INSULIN, INTACT, LC/MS/MS: 8 u[IU]/mL (ref <=16)
Insulin Resistance Score: 32 (ref <=66)

## 2020-10-29 LAB — CARDIO IQ® FIBRINOGEN ANTIGEN, NEPHELOMETRY: FIBRINOGEN ANTIGEN, NEPHELOMETRY: 310 mg/dL (ref 180–350)

## 2020-10-29 LAB — CARDIO IQ® NT PROBNP: NT PROBNP: 53 pg/mL (ref <372)

## 2020-10-29 LAB — CARDIO IQ® HOMOCYSTEINE: Homocysteine: 7.8 umol/L (ref <10.4)

## 2020-10-29 LAB — T4: T4, Total: 8.3 ug/dL (ref 5.1–11.9)

## 2020-10-29 LAB — TSH: TSH: 1.18 mIU/L

## 2020-11-12 ENCOUNTER — Ambulatory Visit: Payer: 59 | Admitting: Adult Health

## 2021-04-08 ENCOUNTER — Ambulatory Visit: Payer: 59 | Admitting: Adult Health

## 2021-04-23 ENCOUNTER — Other Ambulatory Visit: Payer: Self-pay | Admitting: Women's Health

## 2021-05-17 ENCOUNTER — Other Ambulatory Visit: Payer: Self-pay | Admitting: Women's Health

## 2021-05-17 DIAGNOSIS — N393 Stress incontinence (female) (male): Secondary | ICD-10-CM

## 2021-08-05 ENCOUNTER — Ambulatory Visit: Payer: 59 | Admitting: Obstetrics and Gynecology

## 2021-12-30 ENCOUNTER — Ambulatory Visit (INDEPENDENT_AMBULATORY_CARE_PROVIDER_SITE_OTHER): Payer: 59 | Admitting: Adult Health

## 2021-12-30 ENCOUNTER — Encounter: Payer: Self-pay | Admitting: Adult Health

## 2021-12-30 ENCOUNTER — Other Ambulatory Visit: Payer: Self-pay

## 2021-12-30 VITALS — BP 151/92 | HR 74 | Ht 66.0 in | Wt 211.0 lb

## 2021-12-30 DIAGNOSIS — N949 Unspecified condition associated with female genital organs and menstrual cycle: Secondary | ICD-10-CM | POA: Diagnosis not present

## 2021-12-30 DIAGNOSIS — N921 Excessive and frequent menstruation with irregular cycle: Secondary | ICD-10-CM | POA: Diagnosis not present

## 2021-12-30 DIAGNOSIS — R140 Abdominal distension (gaseous): Secondary | ICD-10-CM | POA: Diagnosis not present

## 2021-12-30 NOTE — Progress Notes (Signed)
°  Subjective:     Patient ID: Regina Foley, female   DOB: 03-01-1986, 36 y.o.   MRN: [id number]  HPI Regina Foley is a 36 year old white female, with DP. [id number], in complaining of irregular bleeding for 3 months and stomach sore and feels bloated.will spot then bleeding heavy, and lasted period lasted about 3 weeks. HGB 15.3 on 12/28/21.  Has noticed more clots.  PCP is Dr Harmon Pier.  Lab Results  Component Value Date   DIAGPAP  10/15/2020    - Negative for intraepithelial lesion or malignancy (NILM)   [place] Negative 10/15/2020    Review of Systems Irregular bleeding, heavy at times +stomach feels sore +bloating. Reviewed past medical,surgical, social and family history. Reviewed medications and allergies.     Objective:   Physical Exam BP (!) 151/92 (BP Location: Left Arm, Patient Position: Sitting, Cuff Size: Normal)    Pulse 74    Ht 5\' 6"  (1.676 m)    Wt 211 lb (95.7 kg)    LMP 12/05/2021    Breastfeeding No    BMI 34.06 kg/m     Skin warm and dry.Pelvic: external genitalia is normal in appearance no lesions, vagina: scant discharge without odor,urethra has no lesions or masses noted, cervix:smooth and bulbous, uterus: normal size, shape and contour, mildly tender, no masses felt, adnexa: no masses or tenderness noted. Bladder is non tender and no masses felt.   [place] - 12/30/21 1109       Pregnancy Intention Screening   Does the patient want to become pregnant in the next year? No    Does the patient's partner want to become pregnant in the next year? No    Would the patient like to discuss contraceptive options today? No      Contraception Wrap Up   Current Method Female Sterilization    End Method Female Sterilization    Contraception Counseling Provided No            Examination chaperoned by Celene Squibb LPN  Assessment:     1. Uterine tenderness Will get pelvic US to assess uterus and ovaries 01/06/22 at [facility] at 3:30 pm, and will talk when results back  -  US PELVIC COMPLETE WITH TRANSVAGINAL; Future  2. Menorrhagia with irregular cycle Pelvic US 01/06/22 to assess uterus and ovaries  - US PELVIC COMPLETE WITH TRANSVAGINAL; Future  3. Bloating Getting [place] 01/06/22 to assess uterus and ovaries  - US PELVIC COMPLETE WITH TRANSVAGINAL; Future     Plan:     Follow up prn

## 2022-01-02 ENCOUNTER — Other Ambulatory Visit: Payer: Self-pay | Admitting: Adult Health

## 2022-01-02 MED ORDER — NYSTATIN 100000 UNIT/ML MT SUSP: 5.0000 mL | Freq: Four times a day (QID) | OROMUCOSAL | 0 refills | Status: AC

## 2022-01-02 NOTE — Progress Notes (Signed)
Will rx nystatin oral susp for Trush

## 2022-01-06 ENCOUNTER — Other Ambulatory Visit: Payer: Self-pay

## 2022-01-06 ENCOUNTER — Ambulatory Visit (HOSPITAL_COMMUNITY)
Admission: RE | Admit: 2022-01-06 | Discharge: 2022-01-06 | Disposition: A | Discharge status: Home / Self Care | Payer: 59 | Source: Ambulatory Visit | Attending: Adult Health | Admitting: Adult Health

## 2022-01-06 DIAGNOSIS — N949 Unspecified condition associated with female genital organs and menstrual cycle: Secondary | ICD-10-CM | POA: Insufficient documentation

## 2022-01-06 DIAGNOSIS — R140 Abdominal distension (gaseous): Secondary | ICD-10-CM | POA: Insufficient documentation

## 2022-01-06 DIAGNOSIS — N921 Excessive and frequent menstruation with irregular cycle: Secondary | ICD-10-CM | POA: Diagnosis present

## 2022-01-10 ENCOUNTER — Other Ambulatory Visit: Payer: Self-pay | Admitting: Adult Health

## 2022-01-10 MED ORDER — LO LOESTRIN FE 1 MG-10 MCG / 10 MCG PO TABS: 1.0000 | ORAL_TABLET | Freq: Every day | ORAL | 11 refills | Status: DC

## 2022-01-10 NOTE — Progress Notes (Signed)
Will rx lo loestrin to see if helps with periods

## 2022-02-10 ENCOUNTER — Other Ambulatory Visit: Payer: 59 | Admitting: Women's Health

## 2022-10-12 IMAGING — US US PELVIS COMPLETE WITH TRANSVAGINAL
1 series · 14 of 25 positions shown · non-contrast
Comparison: None

CLINICAL DATA: Menorrhagia with uterine tenderness and bloating,
LMP 12/05/2021, G3P3



[Series 1: us pelvic complete with transvaginal · 14 of 93 slices shown]
[im 1/93]
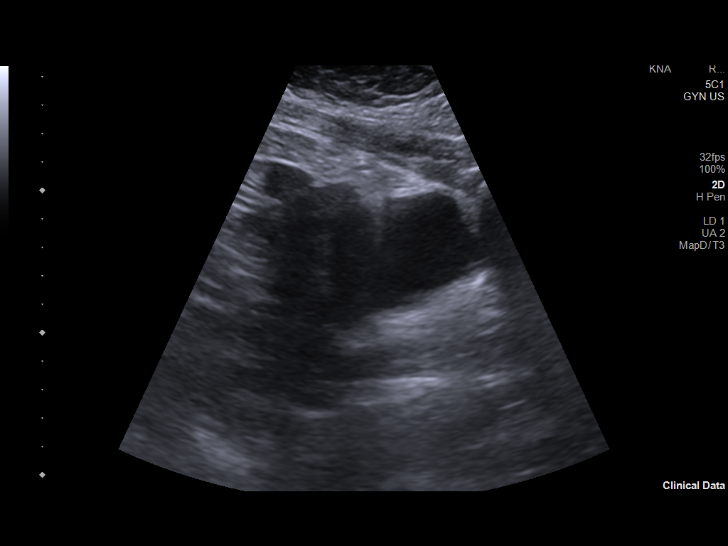
[im 8/93]
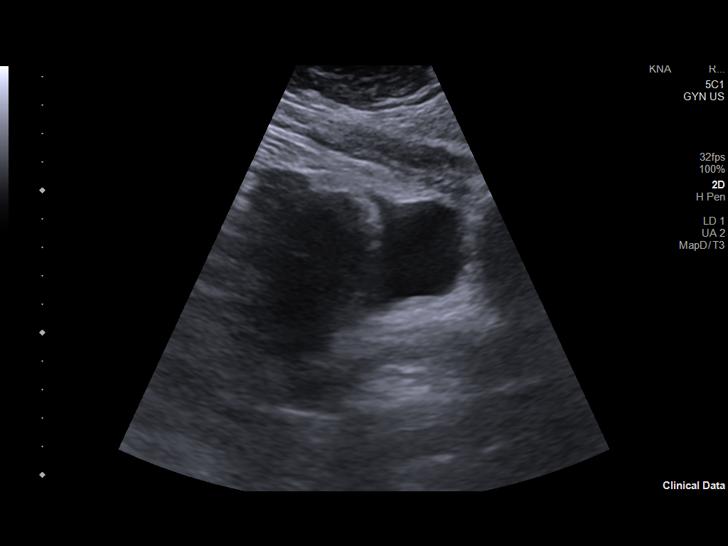
[im 16/93]
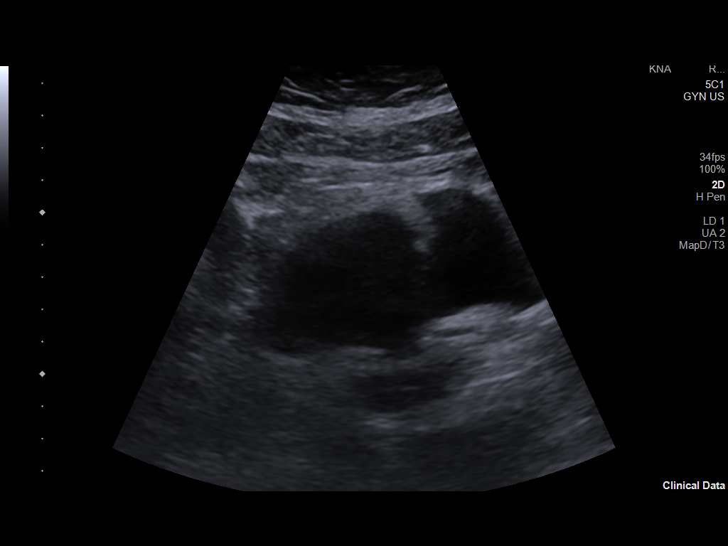
[im 24/93]
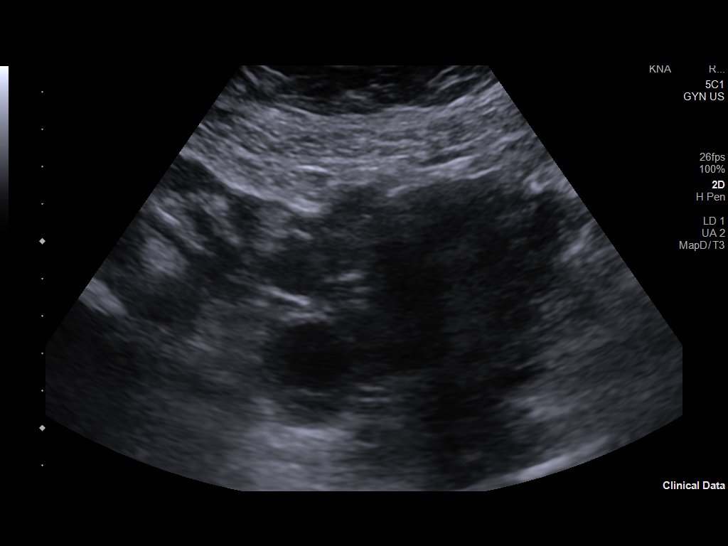
[im 31/93]
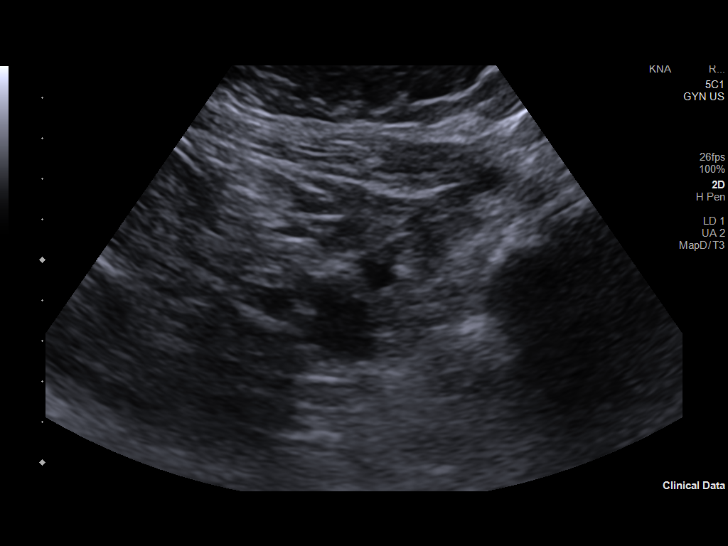
[im 35/93]
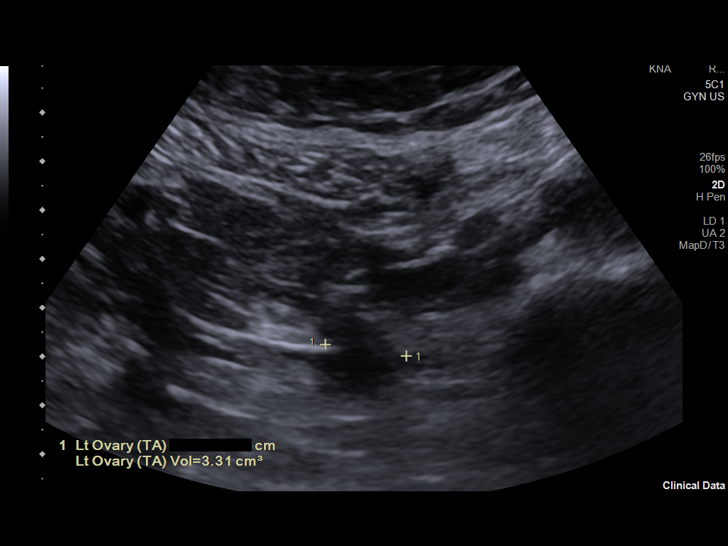
[im 43/93]
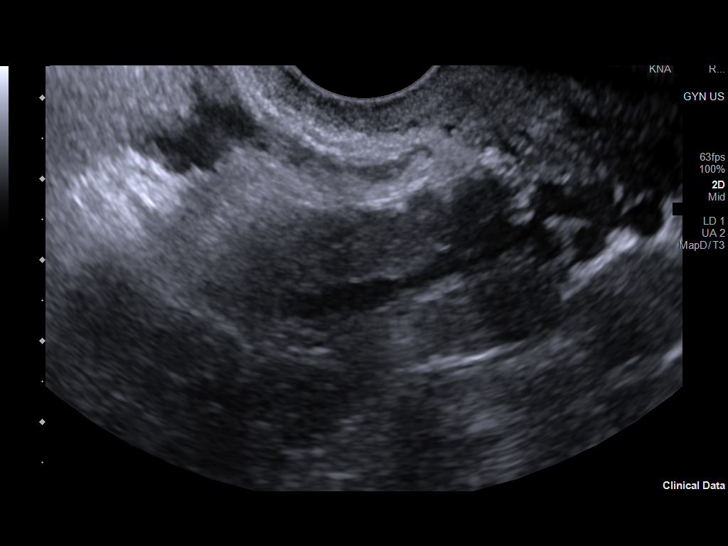
[im 50/93]
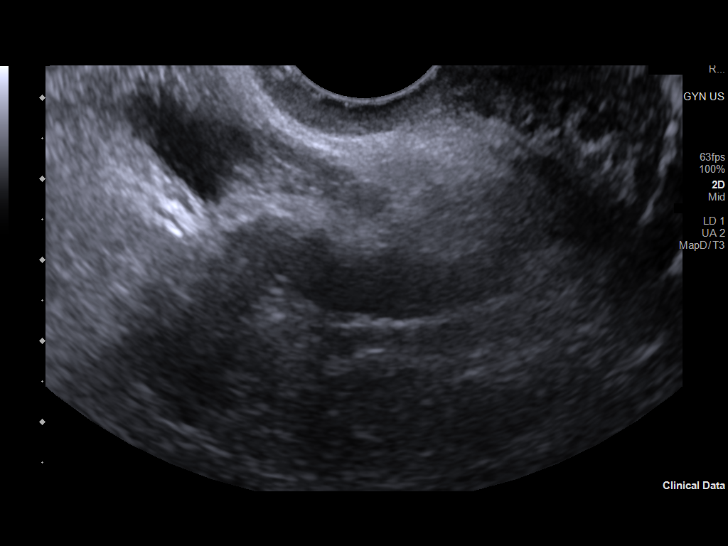
[im 58/93]
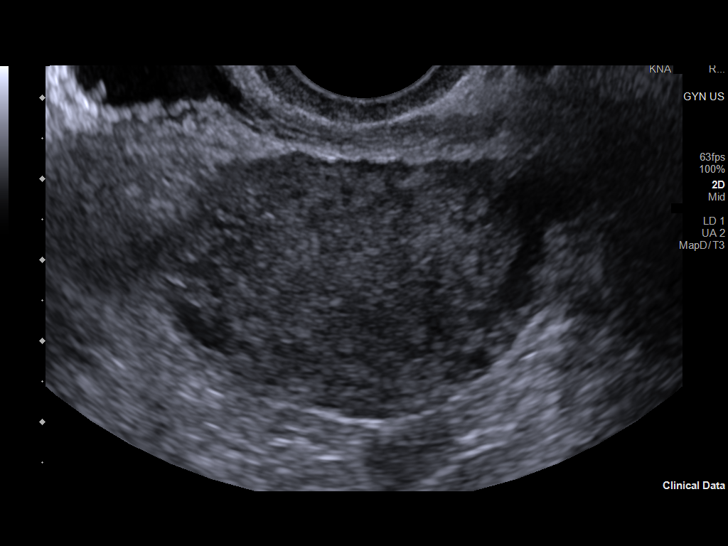
[im 62/93]
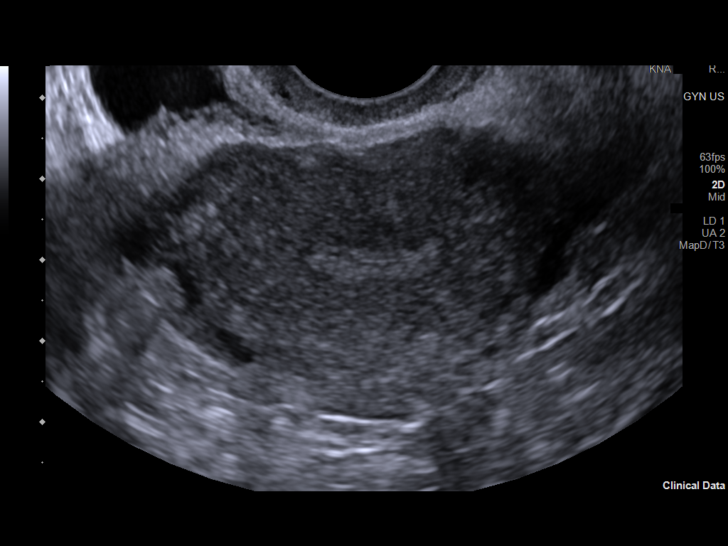
[im 70/93]
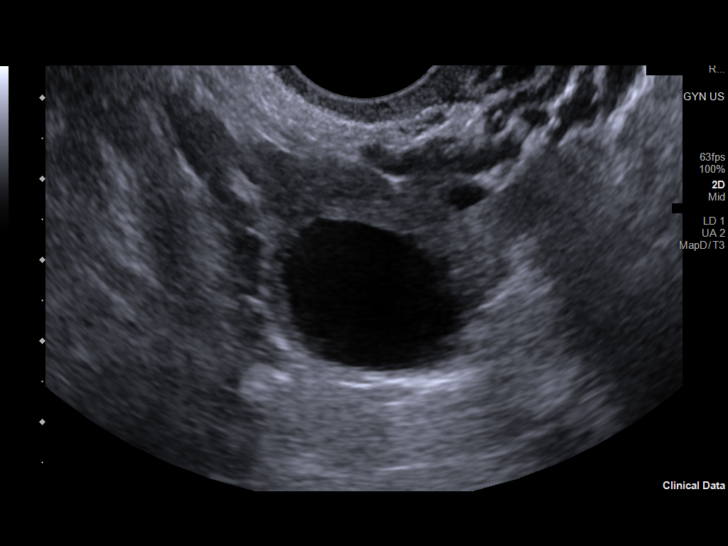
[im 77/93]
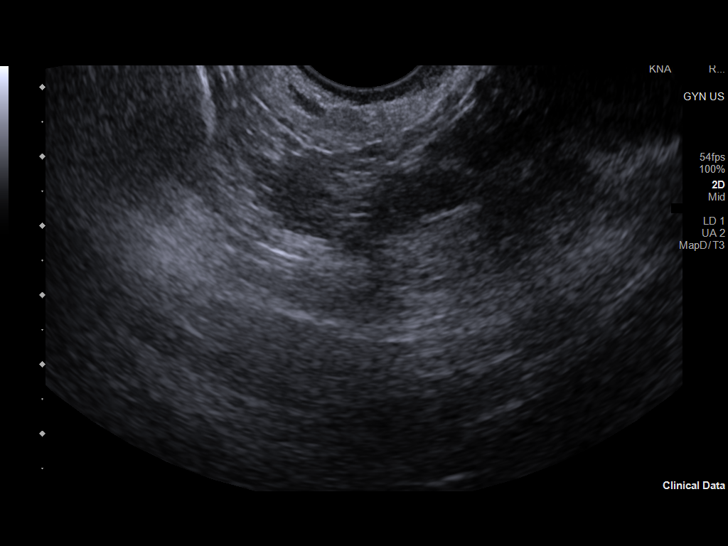
[im 85/93]
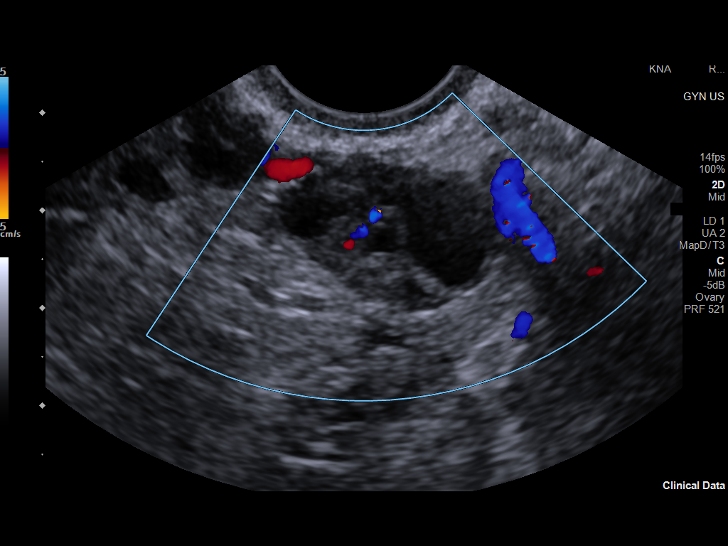
[im 93/93]
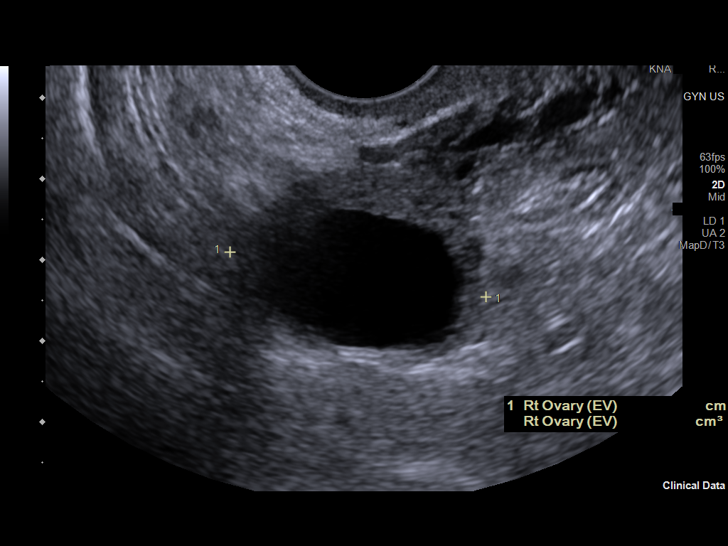

[14 of 25 positions shown; findings below may reference images not displayed]

FINDINGS: Uterus

Measurements: 8.0 x 4.0 x 6.0 cm = volume: 101 mL. Anteverted.
Normal morphology without mass

Endometrium

Thickness: 5 mm. Trace nonspecific endometrial fluid. No focal mass.

Right ovary

Measurements: 3.0 x 2.8 x 3.2 cm = volume: 13.8 mL. Dominant
follicle 2.3 cm diameter; no follow-up imaging recommended

Left ovary

Measurements: 2.3 x 1.4 x 2.4 cm = volume: 3.9 mL. Normal morphology
without mass

Other findings

No free pelvic fluid.  No adnexal masses.
IMPRESSION: No pelvic sonographic abnormalities.

## 2022-11-15 ENCOUNTER — Other Ambulatory Visit: Payer: Self-pay | Admitting: Adult Health

## 2022-11-16 ENCOUNTER — Other Ambulatory Visit: Payer: Self-pay | Admitting: Adult Health

## 2022-11-16 MED ORDER — NORETHIN ACE-ETH ESTRAD-FE 1-20 MG-MCG(24) PO TABS: 1.0000 | ORAL_TABLET | Freq: Every day | ORAL | 11 refills | Status: AC

## 2022-11-16 NOTE — Progress Notes (Signed)
Rx Loestrin 1/20
